# Patient Record
Sex: Male | Born: 1960 | Race: Black or African American | Hispanic: No | Marital: Single | Smoking: Current some day smoker
Health system: Southern US, Community
[De-identification: ages and names within clinical notes are randomized; demographics above are authoritative.]

## PROBLEM LIST (undated history)

## (undated) DIAGNOSIS — K148 Other diseases of tongue: Secondary | ICD-10-CM

## (undated) DIAGNOSIS — K219 Gastro-esophageal reflux disease without esophagitis: Secondary | ICD-10-CM

## (undated) DIAGNOSIS — C801 Malignant (primary) neoplasm, unspecified: Secondary | ICD-10-CM

## (undated) DIAGNOSIS — Z923 Personal history of irradiation: Secondary | ICD-10-CM

## (undated) DIAGNOSIS — I10 Essential (primary) hypertension: Secondary | ICD-10-CM

## (undated) HISTORY — PX: FRACTURE SURGERY: SHX138

---

## 1997-07-12 ENCOUNTER — Emergency Department (HOSPITAL_COMMUNITY): Admission: EM | Admit: 1997-07-12 | Discharge: 1997-07-12 | Payer: Self-pay | Admitting: Emergency Medicine

## 1999-04-14 ENCOUNTER — Emergency Department (HOSPITAL_COMMUNITY): Admission: EM | Admit: 1999-04-14 | Discharge: 1999-04-15 | Payer: Self-pay | Admitting: Emergency Medicine

## 1999-04-14 ENCOUNTER — Encounter: Payer: Self-pay | Admitting: Emergency Medicine

## 2000-10-09 ENCOUNTER — Emergency Department (HOSPITAL_COMMUNITY): Admission: EM | Admit: 2000-10-09 | Discharge: 2000-10-09 | Payer: Self-pay | Admitting: Emergency Medicine

## 2000-10-10 ENCOUNTER — Encounter: Payer: Self-pay | Admitting: Emergency Medicine

## 2001-06-16 ENCOUNTER — Emergency Department (HOSPITAL_COMMUNITY): Admission: EM | Admit: 2001-06-16 | Discharge: 2001-06-17 | Payer: Self-pay | Admitting: Emergency Medicine

## 2001-06-17 ENCOUNTER — Encounter: Payer: Self-pay | Admitting: Emergency Medicine

## 2002-08-15 ENCOUNTER — Emergency Department (HOSPITAL_COMMUNITY): Admission: EM | Admit: 2002-08-15 | Discharge: 2002-08-15 | Payer: Self-pay | Admitting: Emergency Medicine

## 2002-08-15 ENCOUNTER — Encounter: Payer: Self-pay | Admitting: Emergency Medicine

## 2003-05-23 ENCOUNTER — Emergency Department (HOSPITAL_COMMUNITY): Admission: EM | Admit: 2003-05-23 | Discharge: 2003-05-23 | Payer: Self-pay | Admitting: Emergency Medicine

## 2003-07-21 ENCOUNTER — Emergency Department (HOSPITAL_COMMUNITY): Admission: EM | Admit: 2003-07-21 | Discharge: 2003-07-21 | Payer: Self-pay | Admitting: Emergency Medicine

## 2003-08-11 ENCOUNTER — Emergency Department (HOSPITAL_COMMUNITY): Admission: EM | Admit: 2003-08-11 | Discharge: 2003-08-11 | Payer: Self-pay | Admitting: Emergency Medicine

## 2003-08-23 ENCOUNTER — Emergency Department (HOSPITAL_COMMUNITY): Admission: EM | Admit: 2003-08-23 | Discharge: 2003-08-24 | Payer: Self-pay | Admitting: Emergency Medicine

## 2004-05-26 ENCOUNTER — Emergency Department (HOSPITAL_COMMUNITY): Admission: EM | Admit: 2004-05-26 | Discharge: 2004-05-27 | Payer: Self-pay | Admitting: Emergency Medicine

## 2004-09-15 ENCOUNTER — Emergency Department (HOSPITAL_COMMUNITY): Admission: EM | Admit: 2004-09-15 | Discharge: 2004-09-16 | Payer: Self-pay | Admitting: Emergency Medicine

## 2005-01-07 ENCOUNTER — Inpatient Hospital Stay (HOSPITAL_COMMUNITY): Admission: AC | Admit: 2005-01-07 | Discharge: 2005-01-08 | Payer: Self-pay

## 2005-04-16 ENCOUNTER — Emergency Department (HOSPITAL_COMMUNITY): Admission: EM | Admit: 2005-04-16 | Discharge: 2005-04-16 | Payer: Self-pay | Admitting: Emergency Medicine

## 2005-08-02 ENCOUNTER — Emergency Department (HOSPITAL_COMMUNITY): Admission: EM | Admit: 2005-08-02 | Discharge: 2005-08-02 | Payer: Self-pay | Admitting: Emergency Medicine

## 2005-09-18 ENCOUNTER — Emergency Department (HOSPITAL_COMMUNITY): Admission: EM | Admit: 2005-09-18 | Discharge: 2005-09-19 | Payer: Self-pay | Admitting: Emergency Medicine

## 2005-12-23 ENCOUNTER — Emergency Department (HOSPITAL_COMMUNITY): Admission: EM | Admit: 2005-12-23 | Discharge: 2005-12-23 | Payer: Self-pay | Admitting: *Deleted

## 2006-01-06 ENCOUNTER — Emergency Department (HOSPITAL_COMMUNITY): Admission: EM | Admit: 2006-01-06 | Discharge: 2006-01-06 | Payer: Self-pay | Admitting: Emergency Medicine

## 2006-01-26 ENCOUNTER — Emergency Department (HOSPITAL_COMMUNITY): Admission: EM | Admit: 2006-01-26 | Discharge: 2006-01-26 | Payer: Self-pay | Admitting: Emergency Medicine

## 2006-01-26 ENCOUNTER — Emergency Department (HOSPITAL_COMMUNITY): Admission: EM | Admit: 2006-01-26 | Discharge: 2006-01-27 | Payer: Self-pay | Admitting: Emergency Medicine

## 2006-02-02 ENCOUNTER — Emergency Department (HOSPITAL_COMMUNITY): Admission: EM | Admit: 2006-02-02 | Discharge: 2006-02-02 | Payer: Self-pay | Admitting: Emergency Medicine

## 2006-02-11 ENCOUNTER — Emergency Department (HOSPITAL_COMMUNITY): Admission: EM | Admit: 2006-02-11 | Discharge: 2006-02-12 | Payer: Self-pay | Admitting: Emergency Medicine

## 2006-02-26 ENCOUNTER — Emergency Department (HOSPITAL_COMMUNITY): Admission: EM | Admit: 2006-02-26 | Discharge: 2006-02-27 | Payer: Self-pay | Admitting: Emergency Medicine

## 2006-09-26 ENCOUNTER — Emergency Department (HOSPITAL_COMMUNITY): Admission: EM | Admit: 2006-09-26 | Discharge: 2006-09-26 | Payer: Self-pay | Admitting: Emergency Medicine

## 2007-01-25 ENCOUNTER — Emergency Department (HOSPITAL_COMMUNITY): Admission: EM | Admit: 2007-01-25 | Discharge: 2007-01-26 | Payer: Self-pay | Admitting: Family Medicine

## 2007-04-09 ENCOUNTER — Emergency Department (HOSPITAL_COMMUNITY): Admission: EM | Admit: 2007-04-09 | Discharge: 2007-04-10 | Payer: Self-pay | Admitting: Emergency Medicine

## 2007-09-28 ENCOUNTER — Emergency Department (HOSPITAL_COMMUNITY): Admission: EM | Admit: 2007-09-28 | Discharge: 2007-09-28 | Payer: Self-pay | Admitting: Emergency Medicine

## 2007-11-19 ENCOUNTER — Emergency Department (HOSPITAL_COMMUNITY): Admission: EM | Admit: 2007-11-19 | Discharge: 2007-11-20 | Payer: Self-pay | Admitting: Emergency Medicine

## 2007-11-20 ENCOUNTER — Inpatient Hospital Stay (HOSPITAL_COMMUNITY): Admission: EM | Admit: 2007-11-20 | Discharge: 2007-11-23 | Payer: Self-pay | Admitting: Psychiatry

## 2007-11-20 ENCOUNTER — Ambulatory Visit: Payer: Self-pay | Admitting: Psychiatry

## 2007-12-27 ENCOUNTER — Emergency Department (HOSPITAL_COMMUNITY): Admission: EM | Admit: 2007-12-27 | Discharge: 2007-12-27 | Payer: Self-pay | Admitting: Emergency Medicine

## 2008-02-04 ENCOUNTER — Emergency Department (HOSPITAL_COMMUNITY): Admission: EM | Admit: 2008-02-04 | Discharge: 2008-02-05 | Payer: Self-pay | Admitting: Emergency Medicine

## 2008-08-25 ENCOUNTER — Emergency Department (HOSPITAL_COMMUNITY): Admission: EM | Admit: 2008-08-25 | Discharge: 2008-08-25 | Payer: Self-pay | Admitting: Emergency Medicine

## 2008-08-26 ENCOUNTER — Emergency Department (HOSPITAL_COMMUNITY): Admission: EM | Admit: 2008-08-26 | Discharge: 2008-08-27 | Payer: Self-pay | Admitting: Emergency Medicine

## 2008-09-08 ENCOUNTER — Emergency Department (HOSPITAL_COMMUNITY): Admission: EM | Admit: 2008-09-08 | Discharge: 2008-09-08 | Payer: Self-pay | Admitting: Emergency Medicine

## 2009-03-28 ENCOUNTER — Emergency Department (HOSPITAL_COMMUNITY): Admission: EM | Admit: 2009-03-28 | Discharge: 2009-03-29 | Payer: Self-pay | Admitting: Emergency Medicine

## 2009-07-20 ENCOUNTER — Emergency Department (HOSPITAL_COMMUNITY): Admission: EM | Admit: 2009-07-20 | Discharge: 2009-07-20 | Payer: Self-pay | Admitting: Emergency Medicine

## 2009-09-16 ENCOUNTER — Emergency Department (HOSPITAL_COMMUNITY): Admission: EM | Admit: 2009-09-16 | Discharge: 2009-09-16 | Payer: Self-pay | Admitting: Emergency Medicine

## 2009-11-11 ENCOUNTER — Emergency Department (HOSPITAL_COMMUNITY): Admission: EM | Admit: 2009-11-11 | Discharge: 2009-11-11 | Payer: Self-pay | Admitting: Emergency Medicine

## 2010-01-21 ENCOUNTER — Emergency Department (HOSPITAL_COMMUNITY)
Admission: EM | Admit: 2010-01-21 | Discharge: 2010-01-21 | Payer: Self-pay | Source: Home / Self Care | Admitting: Emergency Medicine

## 2010-05-01 LAB — POCT I-STAT, CHEM 8
BUN: 3 mg/dL — ABNORMAL LOW (ref 6–23)
Calcium, Ion: 1.07 mmol/L — ABNORMAL LOW (ref 1.12–1.32)
Chloride: 99 meq/L (ref 96–112)
Creatinine, Ser: 0.9 mg/dL (ref 0.4–1.5)
Glucose, Bld: 124 mg/dL — ABNORMAL HIGH (ref 70–99)
HCT: 39 % (ref 39.0–52.0)
Hemoglobin: 13.3 g/dL (ref 13.0–17.0)
Potassium: 3.8 meq/L (ref 3.5–5.1)
Sodium: 132 mEq/L — ABNORMAL LOW (ref 135–145)
TCO2: 29 mmol/L (ref 0–100)

## 2010-05-01 LAB — COMPREHENSIVE METABOLIC PANEL
AST: 254 U/L — ABNORMAL HIGH (ref 0–37)
Albumin: 3.1 g/dL — ABNORMAL LOW (ref 3.5–5.2)
Alkaline Phosphatase: 167 U/L — ABNORMAL HIGH (ref 39–117)
CO2: 26 mEq/L (ref 19–32)
Creatinine, Ser: 0.68 mg/dL (ref 0.4–1.5)
Glucose, Bld: 125 mg/dL — ABNORMAL HIGH (ref 70–99)
Total Bilirubin: 1.2 mg/dL (ref 0.3–1.2)

## 2010-05-01 LAB — URINALYSIS, ROUTINE W REFLEX MICROSCOPIC
Glucose, UA: NEGATIVE mg/dL
Ketones, ur: 15 mg/dL — AB
Nitrite: NEGATIVE
Protein, ur: 30 mg/dL — AB
Specific Gravity, Urine: 1.027 (ref 1.005–1.030)
Urobilinogen, UA: 1 mg/dL (ref 0.0–1.0)
pH: 5.5 (ref 5.0–8.0)

## 2010-05-01 LAB — COMPREHENSIVE METABOLIC PANEL WITH GFR
ALT: 252 U/L — ABNORMAL HIGH (ref 0–53)
BUN: 5 mg/dL — ABNORMAL LOW (ref 6–23)
Calcium: 8.9 mg/dL (ref 8.4–10.5)
Chloride: 97 meq/L (ref 96–112)
GFR calc Af Amer: >60 mL/min (ref >60)
GFR calc non Af Amer: >60 mL/min (ref >60)
Potassium: 3.3 meq/L — ABNORMAL LOW (ref 3.5–5.1)
Sodium: 132 meq/L — ABNORMAL LOW (ref 135–145)
Total Protein: 6.5 g/dL (ref 6.0–8.3)

## 2010-05-01 LAB — LIPASE, BLOOD: Lipase: 114 U/L — ABNORMAL HIGH (ref 11–59)

## 2010-05-01 LAB — URINE MICROSCOPIC-ADD ON

## 2010-05-25 LAB — POCT I-STAT, CHEM 8
BUN: 13 mg/dL (ref 6–23)
Calcium, Ion: 1.03 mmol/L — ABNORMAL LOW (ref 1.12–1.32)
Chloride: 105 mEq/L (ref 96–112)
Creatinine, Ser: 0.8 mg/dL (ref 0.4–1.5)
Glucose, Bld: 91 mg/dL (ref 70–99)

## 2010-05-25 LAB — DIFFERENTIAL
Basophils Relative: 0 % (ref 0–1)
Eosinophils Absolute: 0 10*3/uL (ref 0.0–0.7)
Monocytes Absolute: 0.6 10*3/uL (ref 0.1–1.0)
Monocytes Relative: 8 % (ref 3–12)
Neutrophils Relative %: 68 % (ref 43–77)

## 2010-05-25 LAB — CBC
Hemoglobin: 12.6 g/dL — ABNORMAL LOW (ref 13.0–17.0)
MCHC: 35.2 g/dL (ref 30.0–36.0)
MCV: 100.2 fL — ABNORMAL HIGH (ref 78.0–100.0)
RBC: 3.56 MIL/uL — ABNORMAL LOW (ref 4.22–5.81)
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
## 2010-07-04 NOTE — Discharge Summary (Signed)
NAME:  NAYSHAWN, MESTA NO.:  [id number]   MEDICAL RECORD NO.:  [id number]          PATIENT TYPE:  IPS   LOCATION:  0508                          FACILITY:  [facility]   PHYSICIAN:  Geoffery Lyons, M.D.      DATE OF BIRTH:  April 08, 1960   DATE OF ADMISSION:  11/20/2007  DATE OF DISCHARGE:  11/23/2007                               DISCHARGE SUMMARY   CHIEF COMPLAINT AND PRESENT ILLNESS:  This was the first admission to  [facility] for this 50 year old, single, African  American male voluntarily admitted.  Presented with chronic alcohol  abuse since age 89.  He was found passed out on the floor of a  laundromat and was brought to the emergency room by emergency services.  Alcohol level was 388.  UDS negative for substances.  Admitted with  suicidal thoughts with a plan to run into traffic in order to be hit by  a vehicle.  Has been drinking steadily since June with recent escalation  to 6-12 beers daily.  Endorsed that I have nothing to live for.  Recently released from jail and has been living from place to place.   PAST PSYCHIATRIC HISTORY:  First time at [facility].  According  to the history, endorsed persistent alcohol use since age 43.  Denies  previous treatment.  No clear evidence of abstinence.  Has some past  history of marijuana use.  No cocaine.   MEDICAL HISTORY:  Noncontributory.   MEDICATIONS:  None.   PHYSICAL EXAMINATION:  Failed to show any acute findings   LABORATORY WORK:  Magnesium 1.8, TSH 4.561, T4 1.21, B12 486.  RPR  nonreactive.  Mean corpuscular volume 101.8.  SGOT 64, SGPT 60, total  bilirubin 1.1.   PHYSICAL EXAMINATION:  Reveals a fully alert and cooperative male with  adequate eye contact.  Mood depressed.  Affect depressed.  Disheveled.  Speech was normal rate, tone and production.  Feeling guilty and  hopeless about the future.  Thought processes revealed suicidal thoughts  with plan, no intent.  He keeps saying he  has no reason to live.  Hopeless.  No homicidal thoughts.  No hallucinations.  No delusions.  Cognition well preserved.   IMPRESSION:  AXIS I:  Alcohol dependence, substance-induced depression.  AXIS II: No diagnosis.  AXIS III:  Increased transaminases.  AXIS IV: Moderate.  AXIS V:  On admission, 35; GAF in the last year, 55-60.   COURSE IN THE HOSPITAL:  He was admitted and started individual and  group psychotherapy.  He was detoxified with Librium.  As already  stated, he did endorse persistent use of alcohol of six to seven 40-  ounce beers daily.  Thoughts of suicide and depression.  Has three  children, ages 75, 68 and 14.  No income.  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  Starts drinking in the morning.  Strong family history of  alcohol dependency.  Recently incarcerated.  Drinking every day since  June.  The incarceration was secondary to drinking-related charges.  On  November 22, 2007, he endorsed that he felt he was ready to  move on and  work on himself.  He wanted to be considered for a halfway house.  He  wanted to work at __________ [employer] to do the work.  On November 23, 2007, he endorsed that he was not going to [facility].  He endorsed  that he was going to go to the shelter and go to work.  Beds were  available at two halfway houses, but he refused.  He would like to try  his plan first of going to the homeless shelter and save money and from  there go to an independent apartment at a somewhat independent living  facility.  Upon discharge, he is in full contact reality with no overt  evidence of withdrawal.  No active suicidal or homicidal ideations.  Discharged to outpatient followup.   DISCHARGE DIAGNOSES:  AXIS I: Alcohol dependence, substance-induced mood  disorder.  AXIS II: No diagnosis.  AXIS III:  Increased liver enzymes.  AXIS IV: Moderate.  AXIS V:  On discharge, 50.   DISCHARGE MEDICATIONS:  Discharged on Librium taper 25 mg 1 by mouth on  this  discharge date at 5:00 p.m., then 1 the following day, then 1 twice  daily the following day, than on November 25, 2007, one in the morning and  discontinue.   FOLLOWUP:  Follow up at [facility] with [facility].      Geoffery Lyons, M.D.  Electronically Signed     IL/MEDQ  D:  12/15/2007  T:  12/16/2007  Job:  [id number]

## 2010-09-02 ENCOUNTER — Emergency Department (HOSPITAL_COMMUNITY)
Admission: EM | Admit: 2010-09-02 | Discharge: 2010-09-02 | Disposition: A | Discharge status: Home / Self Care | Payer: Self-pay | Attending: Emergency Medicine | Admitting: Emergency Medicine

## 2010-09-02 ENCOUNTER — Emergency Department (HOSPITAL_COMMUNITY): Payer: Self-pay

## 2010-09-02 DIAGNOSIS — Y998 Other external cause status: Secondary | ICD-10-CM | POA: Insufficient documentation

## 2010-09-02 DIAGNOSIS — F1010 Alcohol abuse, uncomplicated: Secondary | ICD-10-CM | POA: Insufficient documentation

## 2010-09-02 DIAGNOSIS — S0990XA Unspecified injury of head, initial encounter: Secondary | ICD-10-CM | POA: Insufficient documentation

## 2010-09-02 LAB — ETHANOL: Alcohol, Ethyl (B): 179 mg/dL — ABNORMAL HIGH (ref 0–11)

## 2010-09-02 LAB — POCT I-STAT, CHEM 8
BUN: 9 mg/dL (ref 6–23)
Calcium, Ion: 1.05 mmol/L — ABNORMAL LOW (ref 1.12–1.32)
Creatinine, Ser: 1.3 mg/dL (ref 0.50–1.35)
TCO2: 23 mmol/L (ref 0–100)

## 2010-11-07 LAB — CBC
Hemoglobin: 13.5
MCHC: 34.8
MCV: 99.5
RBC: 3.91 — ABNORMAL LOW

## 2010-11-07 LAB — DIFFERENTIAL
Basophils Absolute: 0.1
Basophils Relative: 2 — ABNORMAL HIGH
Eosinophils Absolute: 0.1
Lymphs Abs: 2.8
Neutrophils Relative %: 51

## 2010-11-07 LAB — COMPREHENSIVE METABOLIC PANEL
ALT: 80 — ABNORMAL HIGH
CO2: 27
Calcium: 9.3
Chloride: 105
GFR calc non Af Amer: >60
Glucose, Bld: 109 — ABNORMAL HIGH
Sodium: 140
Total Bilirubin: 0.7

## 2010-11-07 LAB — URINALYSIS, ROUTINE W REFLEX MICROSCOPIC
Glucose, UA: NEGATIVE
Ketones, ur: NEGATIVE
Nitrite: NEGATIVE
Protein, ur: NEGATIVE
Urobilinogen, UA: 0.2

## 2010-11-14 LAB — DIFFERENTIAL
Basophils Absolute: 0.1
Lymphocytes Relative: 54 — ABNORMAL HIGH
Lymphs Abs: 2.4
Neutro Abs: 1.5 — ABNORMAL LOW
Neutrophils Relative %: 33 — ABNORMAL LOW

## 2010-11-14 LAB — CBC
Platelets: 183
RDW: 16 — ABNORMAL HIGH
WBC: 4.5

## 2010-11-17 LAB — URINALYSIS, ROUTINE W REFLEX MICROSCOPIC
Bilirubin Urine: NEGATIVE
Hgb urine dipstick: NEGATIVE
Ketones, ur: NEGATIVE
Nitrite: NEGATIVE
Protein, ur: NEGATIVE
Urobilinogen, UA: 0.2

## 2010-11-17 LAB — COMPREHENSIVE METABOLIC PANEL
ALT: 60 — ABNORMAL HIGH
Albumin: 4.2
Alkaline Phosphatase: 49
BUN: 10
Chloride: 102
Glucose, Bld: 82
Potassium: 4
Sodium: 139
Total Bilirubin: 1.1
Total Protein: 7.2

## 2010-11-17 LAB — CBC
HCT: 38.5 — ABNORMAL LOW
Hemoglobin: 13.4
Platelets: 280
RDW: 16.1 — ABNORMAL HIGH
WBC: 5.7

## 2010-11-17 LAB — ETHANOL
Alcohol, Ethyl (B): 191 — ABNORMAL HIGH
Alcohol, Ethyl (B): 388 — ABNORMAL HIGH

## 2010-11-17 LAB — RAPID URINE DRUG SCREEN, HOSP PERFORMED
Amphetamines: NOT DETECTED
Tetrahydrocannabinol: NOT DETECTED

## 2010-11-17 LAB — DIFFERENTIAL
Basophils Absolute: 0.1
Basophils Relative: 1
Eosinophils Absolute: 0.1
Monocytes Absolute: 0.4
Monocytes Relative: 8
Neutro Abs: 1.5 — ABNORMAL LOW

## 2010-11-17 LAB — URINE CULTURE: Colony Count: NO GROWTH

## 2010-11-18 LAB — MAGNESIUM: Magnesium: 1.8

## 2010-11-18 LAB — RPR: RPR Ser Ql: NONREACTIVE

## 2010-11-20 LAB — URINALYSIS, ROUTINE W REFLEX MICROSCOPIC
Nitrite: NEGATIVE
Protein, ur: NEGATIVE mg/dL
Specific Gravity, Urine: 1.009 (ref 1.005–1.030)
Urobilinogen, UA: 0.2 mg/dL (ref 0.0–1.0)

## 2010-11-24 LAB — HEPATIC FUNCTION PANEL
Albumin: 3.9
Alkaline Phosphatase: 46
Indirect Bilirubin: 0.7
Total Protein: 7

## 2010-11-24 LAB — PROTIME-INR
INR: 1.1
Prothrombin Time: 14.9

## 2010-11-24 LAB — DIFFERENTIAL
Basophils Absolute: 0.1
Eosinophils Absolute: 0.2
Eosinophils Relative: 3
Lymphocytes Relative: 56 — ABNORMAL HIGH
Lymphs Abs: 3.6
Monocytes Absolute: 0.6

## 2010-11-24 LAB — CBC
HCT: 36.9 — ABNORMAL LOW
Hemoglobin: 13.3
MCV: 101.3 — ABNORMAL HIGH
RDW: 13.9

## 2010-11-24 LAB — I-STAT 8, (EC8 V) (CONVERTED LAB)
Bicarbonate: 24.1 — ABNORMAL HIGH
Chloride: 109
HCT: 40
Operator id: 247071
pCO2, Ven: 37.1 — ABNORMAL LOW
pH, Ven: 7.421 — ABNORMAL HIGH

## 2010-11-24 LAB — POCT I-STAT CREATININE: Creatinine, Ser: 1.2

## 2010-11-24 LAB — OCCULT BLOOD X 1 CARD TO LAB, STOOL: Fecal Occult Bld: POSITIVE

## 2010-11-24 LAB — APTT: aPTT: 27

## 2010-12-01 LAB — I-STAT 8, (EC8 V) (CONVERTED LAB)
BUN: 8
Bicarbonate: 20.1
Glucose, Bld: 89
Hemoglobin: 12.9 — ABNORMAL LOW
pCO2, Ven: 39.2 — ABNORMAL LOW

## 2010-12-01 LAB — DIFFERENTIAL
Basophils Absolute: 0.1
Basophils Relative: 3 — ABNORMAL HIGH
Eosinophils Relative: 3
Monocytes Absolute: 0.3

## 2010-12-01 LAB — CBC
HCT: 32.9 — ABNORMAL LOW
Hemoglobin: 11.6 — ABNORMAL LOW
MCHC: 35.3
RDW: 17.2 — ABNORMAL HIGH

## 2010-12-01 LAB — HEPATIC FUNCTION PANEL
ALT: 34
Alkaline Phosphatase: 56
Bilirubin, Direct: <0.1
Total Bilirubin: 0.7

## 2010-12-31 ENCOUNTER — Emergency Department (HOSPITAL_COMMUNITY)
Admission: EM | Admit: 2010-12-31 | Discharge: 2010-12-31 | Disposition: A | Discharge status: Home / Self Care | Payer: Self-pay | Attending: Emergency Medicine | Admitting: Emergency Medicine

## 2010-12-31 ENCOUNTER — Emergency Department (HOSPITAL_COMMUNITY): Payer: Self-pay

## 2010-12-31 DIAGNOSIS — W172XXA Fall into hole, initial encounter: Secondary | ICD-10-CM | POA: Insufficient documentation

## 2010-12-31 DIAGNOSIS — S8391XA Sprain of unspecified site of right knee, initial encounter: Secondary | ICD-10-CM

## 2010-12-31 DIAGNOSIS — Y9302 Activity, running: Secondary | ICD-10-CM | POA: Insufficient documentation

## 2010-12-31 DIAGNOSIS — F17200 Nicotine dependence, unspecified, uncomplicated: Secondary | ICD-10-CM | POA: Insufficient documentation

## 2010-12-31 DIAGNOSIS — M25569 Pain in unspecified knee: Secondary | ICD-10-CM | POA: Insufficient documentation

## 2010-12-31 DIAGNOSIS — IMO0002 Reserved for concepts with insufficient information to code with codable children: Secondary | ICD-10-CM | POA: Insufficient documentation

## 2010-12-31 HISTORY — DX: Essential (primary) hypertension: I10

## 2010-12-31 MED ORDER — OXYCODONE-ACETAMINOPHEN 5-325 MG PO TABS
1.0000 | ORAL_TABLET | Freq: Once | ORAL | Status: AC
  Administered 2010-12-31: 1 via ORAL
  Filled 2010-12-31: qty 1

## 2010-12-31 MED ORDER — IBUPROFEN 800 MG PO TABS: 800.0000 mg | ORAL_TABLET | Freq: Three times a day (TID) | ORAL | Status: AC | PRN

## 2010-12-31 MED ORDER — IBUPROFEN 800 MG PO TABS: 800.0000 mg | ORAL_TABLET | Freq: Three times a day (TID) | ORAL | Status: DC | PRN

## 2010-12-31 MED ORDER — TRAMADOL HCL 50 MG PO TABS: 50.0000 mg | ORAL_TABLET | Freq: Four times a day (QID) | ORAL | Status: AC | PRN

## 2010-12-31 MED ORDER — TRAMADOL HCL 50 MG PO TABS: 50.0000 mg | ORAL_TABLET | Freq: Four times a day (QID) | ORAL | Status: DC | PRN

## 2010-12-31 NOTE — Progress Notes (Signed)
Orthopedic Tech Progress Note Patient Details:  Lawrence Arias 1961-02-02 [id number] Pt said he needed only one crutches.    [name] [facility] 12/31/2010, 11:30 PM
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
## 2011-01-01 NOTE — ED Provider Notes (Signed)
Medical screening examination/treatment/procedure(s) were performed by non-physician practitioner and as supervising physician I was immediately available for consultation/collaboration.  Nicholes Stairs, MD 01/01/11 [phone]

## 2011-10-04 ENCOUNTER — Emergency Department (HOSPITAL_COMMUNITY)
Admission: EM | Admit: 2011-10-04 | Discharge: 2011-10-04 | Disposition: A | Discharge status: Home / Self Care | Payer: Self-pay | Attending: Emergency Medicine | Admitting: Emergency Medicine

## 2011-10-04 ENCOUNTER — Encounter (HOSPITAL_COMMUNITY): Payer: Self-pay | Admitting: Emergency Medicine

## 2011-10-04 DIAGNOSIS — Y9229 Other specified public building as the place of occurrence of the external cause: Secondary | ICD-10-CM | POA: Insufficient documentation

## 2011-10-04 DIAGNOSIS — S21209A Unspecified open wound of unspecified back wall of thorax without penetration into thoracic cavity, initial encounter: Secondary | ICD-10-CM | POA: Insufficient documentation

## 2011-10-04 DIAGNOSIS — T07XXXA Unspecified multiple injuries, initial encounter: Secondary | ICD-10-CM

## 2011-10-04 DIAGNOSIS — S21219A Laceration without foreign body of unspecified back wall of thorax without penetration into thoracic cavity, initial encounter: Secondary | ICD-10-CM

## 2011-10-04 DIAGNOSIS — S41112A Laceration without foreign body of left upper arm, initial encounter: Secondary | ICD-10-CM

## 2011-10-04 DIAGNOSIS — I10 Essential (primary) hypertension: Secondary | ICD-10-CM | POA: Insufficient documentation

## 2011-10-04 DIAGNOSIS — S41109A Unspecified open wound of unspecified upper arm, initial encounter: Secondary | ICD-10-CM | POA: Insufficient documentation

## 2011-10-04 DIAGNOSIS — F17200 Nicotine dependence, unspecified, uncomplicated: Secondary | ICD-10-CM | POA: Insufficient documentation

## 2011-10-04 MED ORDER — ACETAMINOPHEN 325 MG PO TABS
ORAL_TABLET | ORAL | Status: AC
  Administered 2011-10-04: 650 mg
  Filled 2011-10-04: qty 2

## 2011-10-04 MED ORDER — IBUPROFEN 200 MG PO TABS
ORAL_TABLET | ORAL | Status: AC
  Filled 2011-10-04: qty 2

## 2011-10-04 MED ORDER — CEPHALEXIN 500 MG PO CAPS: 500.0000 mg | ORAL_CAPSULE | Freq: Four times a day (QID) | ORAL | Status: AC

## 2011-10-04 MED ORDER — CEPHALEXIN 500 MG PO CAPS
500.0000 mg | ORAL_CAPSULE | Freq: Once | ORAL | Status: AC
  Administered 2011-10-04: 500 mg via ORAL
  Filled 2011-10-04: qty 1

## 2011-10-04 NOTE — ED Notes (Signed)
PA at bedside suturing.

## 2011-10-04 NOTE — ED Provider Notes (Signed)
Medical screening examination/treatment/procedure(s) were conducted as a shared visit with non-physician practitioner(s) and myself.  I personally evaluated the patient during the encounter   Hanley Seamen, MD 10/04/11 1230
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
## 2011-10-04 NOTE — ED Notes (Signed)
Wounds dressed with bacitracin and gauze.   Instructions for wound care given.
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
## 2011-10-04 NOTE — ED Provider Notes (Signed)
LACERATION REPAIR x 2  Performed by: Jaci Carrel Authorized by: Jaci Carrel Consent: Verbal consent obtained. Risks and benefits: risks, benefits and alternatives were discussed Consent given by: patient Patient identity confirmed: provided demographic data Prepped and Draped in normal sterile fashion Wound explored  Laceration Location: mid lumbar region / left upper lateral extremity  Laceration Length: 2cm / 7 cm  No Foreign Bodies seen or palpated  Anesthesia: local infiltration  Local anesthetic: lidocaine 2% w epinephrine  Anesthetic total: 4 ml  Irrigation method: syringe Amount of cleaning: standard  Skin closure: 4.0 prolene  Number of sutures: 3 / 10  Technique: simple interupted  Patient tolerance: Patient tolerated the procedure well with no immediate complications.   Jaci Carrel, [place] 10/04/11 1224

## 2012-04-12 ENCOUNTER — Encounter (HOSPITAL_COMMUNITY): Payer: Self-pay | Admitting: Adult Health

## 2012-04-12 ENCOUNTER — Emergency Department (HOSPITAL_COMMUNITY): Payer: Self-pay

## 2012-04-12 ENCOUNTER — Emergency Department (HOSPITAL_COMMUNITY)
Admission: EM | Admit: 2012-04-12 | Discharge: 2012-04-12 | Disposition: A | Discharge status: Home / Self Care | Payer: Self-pay | Attending: Emergency Medicine | Admitting: Emergency Medicine

## 2012-04-12 DIAGNOSIS — M549 Dorsalgia, unspecified: Secondary | ICD-10-CM

## 2012-04-12 DIAGNOSIS — Y9389 Activity, other specified: Secondary | ICD-10-CM | POA: Insufficient documentation

## 2012-04-12 DIAGNOSIS — IMO0002 Reserved for concepts with insufficient information to code with codable children: Secondary | ICD-10-CM | POA: Insufficient documentation

## 2012-04-12 DIAGNOSIS — Z8781 Personal history of (healed) traumatic fracture: Secondary | ICD-10-CM | POA: Insufficient documentation

## 2012-04-12 DIAGNOSIS — Z79899 Other long term (current) drug therapy: Secondary | ICD-10-CM | POA: Insufficient documentation

## 2012-04-12 DIAGNOSIS — R509 Fever, unspecified: Secondary | ICD-10-CM | POA: Insufficient documentation

## 2012-04-12 DIAGNOSIS — S298XXA Other specified injuries of thorax, initial encounter: Secondary | ICD-10-CM | POA: Insufficient documentation

## 2012-04-12 DIAGNOSIS — R110 Nausea: Secondary | ICD-10-CM | POA: Insufficient documentation

## 2012-04-12 DIAGNOSIS — J3489 Other specified disorders of nose and nasal sinuses: Secondary | ICD-10-CM | POA: Insufficient documentation

## 2012-04-12 DIAGNOSIS — Y92410 Unspecified street and highway as the place of occurrence of the external cause: Secondary | ICD-10-CM | POA: Insufficient documentation

## 2012-04-12 DIAGNOSIS — R6889 Other general symptoms and signs: Secondary | ICD-10-CM | POA: Insufficient documentation

## 2012-04-12 DIAGNOSIS — I10 Essential (primary) hypertension: Secondary | ICD-10-CM | POA: Insufficient documentation

## 2012-04-12 DIAGNOSIS — F17200 Nicotine dependence, unspecified, uncomplicated: Secondary | ICD-10-CM | POA: Insufficient documentation

## 2012-04-12 DIAGNOSIS — J029 Acute pharyngitis, unspecified: Secondary | ICD-10-CM | POA: Insufficient documentation

## 2012-04-12 LAB — RAPID STREP SCREEN (MED CTR MEBANE ONLY): Streptococcus, Group A Screen (Direct): NEGATIVE

## 2012-04-12 MED ORDER — TRAMADOL HCL 50 MG PO TABS
50.0000 mg | ORAL_TABLET | Freq: Once | ORAL | Status: AC
  Administered 2012-04-12: 50 mg via ORAL
  Filled 2012-04-12: qty 1

## 2012-04-12 MED ORDER — PANTOPRAZOLE SODIUM 20 MG PO TBEC: 40.0000 mg | DELAYED_RELEASE_TABLET | Freq: Every day | ORAL | Status: DC

## 2012-04-12 MED ORDER — TRAMADOL HCL 50 MG PO TABS: 50.0000 mg | ORAL_TABLET | Freq: Four times a day (QID) | ORAL | Status: DC | PRN

## 2012-04-12 MED ORDER — RANITIDINE HCL 150 MG PO CAPS: 150.0000 mg | ORAL_CAPSULE | Freq: Every day | ORAL | Status: DC

## 2012-04-12 NOTE — ED Notes (Signed)
No answer x 1 for triage
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
## 2012-04-12 NOTE — ED Provider Notes (Signed)
Medical screening examination/treatment/procedure(s) were performed by non-physician practitioner and as supervising physician I was immediately available for consultation/collaboration.  [name], MD 04/12/12 2134

## 2012-04-12 NOTE — ED Notes (Signed)
Returned from xray
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
## 2012-07-01 ENCOUNTER — Encounter (HOSPITAL_COMMUNITY): Payer: Self-pay | Admitting: Emergency Medicine

## 2012-07-01 ENCOUNTER — Emergency Department (HOSPITAL_COMMUNITY): Payer: Self-pay

## 2012-07-01 ENCOUNTER — Emergency Department (HOSPITAL_COMMUNITY)
Admission: EM | Admit: 2012-07-01 | Discharge: 2012-07-02 | Disposition: A | Discharge status: Home / Self Care | Payer: Self-pay | Attending: Emergency Medicine | Admitting: Emergency Medicine

## 2012-07-01 DIAGNOSIS — Y9289 Other specified places as the place of occurrence of the external cause: Secondary | ICD-10-CM | POA: Insufficient documentation

## 2012-07-01 DIAGNOSIS — S2249XA Multiple fractures of ribs, unspecified side, initial encounter for closed fracture: Secondary | ICD-10-CM | POA: Insufficient documentation

## 2012-07-01 DIAGNOSIS — I10 Essential (primary) hypertension: Secondary | ICD-10-CM | POA: Insufficient documentation

## 2012-07-01 DIAGNOSIS — S2231XA Fracture of one rib, right side, initial encounter for closed fracture: Secondary | ICD-10-CM

## 2012-07-01 DIAGNOSIS — W1809XA Striking against other object with subsequent fall, initial encounter: Secondary | ICD-10-CM | POA: Insufficient documentation

## 2012-07-01 DIAGNOSIS — S20219A Contusion of unspecified front wall of thorax, initial encounter: Secondary | ICD-10-CM | POA: Insufficient documentation

## 2012-07-01 DIAGNOSIS — Y9389 Activity, other specified: Secondary | ICD-10-CM | POA: Insufficient documentation

## 2012-07-01 DIAGNOSIS — F17200 Nicotine dependence, unspecified, uncomplicated: Secondary | ICD-10-CM | POA: Insufficient documentation

## 2012-07-01 LAB — URINE MICROSCOPIC-ADD ON

## 2012-07-01 LAB — URINALYSIS, ROUTINE W REFLEX MICROSCOPIC
Ketones, ur: 15 mg/dL — AB
Leukocytes, UA: NEGATIVE
Nitrite: NEGATIVE
pH: 7.5 (ref 5.0–8.0)

## 2012-07-01 MED ORDER — OXYCODONE-ACETAMINOPHEN 5-325 MG PO TABS: 1.0000 | ORAL_TABLET | ORAL | Status: DC | PRN

## 2012-07-01 MED ORDER — OXYCODONE-ACETAMINOPHEN 5-325 MG PO TABS
1.0000 | ORAL_TABLET | Freq: Once | ORAL | Status: AC
  Administered 2012-07-01: 1 via ORAL
  Filled 2012-07-01: qty 1

## 2012-07-01 MED ORDER — KETOROLAC TROMETHAMINE 60 MG/2ML IM SOLN
60.0000 mg | Freq: Once | INTRAMUSCULAR | Status: AC
  Administered 2012-07-01: 60 mg via INTRAMUSCULAR
  Filled 2012-07-01: qty 2

## 2012-07-01 NOTE — ED Notes (Signed)
Pt c/o right sided rib pain after falling from standing; pt noted to have itchy rash to arms and nose; no obvious injury noted
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
## 2012-07-02 NOTE — ED Provider Notes (Signed)
Medical screening examination/treatment/procedure(s) were performed by non-physician practitioner and as supervising physician I was immediately available for consultation/collaboration.   Enzley Kitchens, MD 07/02/12 0148 

## 2012-07-02 NOTE — ED Notes (Signed)
Pt educated on insentive spiraometer use. volume noted

## 2012-08-05 ENCOUNTER — Encounter (HOSPITAL_COMMUNITY): Payer: Self-pay | Admitting: *Deleted

## 2012-08-05 ENCOUNTER — Emergency Department (HOSPITAL_COMMUNITY)
Admission: EM | Admit: 2012-08-05 | Discharge: 2012-08-06 | Discharge status: Against Medical Advice | Payer: Self-pay | Attending: Emergency Medicine | Admitting: Emergency Medicine

## 2012-08-05 DIAGNOSIS — I10 Essential (primary) hypertension: Secondary | ICD-10-CM | POA: Insufficient documentation

## 2012-08-05 DIAGNOSIS — R071 Chest pain on breathing: Secondary | ICD-10-CM | POA: Insufficient documentation

## 2012-08-05 DIAGNOSIS — F17200 Nicotine dependence, unspecified, uncomplicated: Secondary | ICD-10-CM | POA: Insufficient documentation

## 2012-08-05 NOTE — ED Notes (Signed)
Pt states he has rib pain because his ribs are broke

## 2013-06-29 ENCOUNTER — Emergency Department (HOSPITAL_COMMUNITY)
Admission: EM | Admit: 2013-06-29 | Discharge: 2013-06-30 | Disposition: A | Discharge status: Home / Self Care | Payer: No Typology Code available for payment source | Attending: Emergency Medicine | Admitting: Emergency Medicine

## 2013-06-29 ENCOUNTER — Encounter (HOSPITAL_COMMUNITY): Payer: Self-pay | Admitting: Emergency Medicine

## 2013-06-29 DIAGNOSIS — Z79899 Other long term (current) drug therapy: Secondary | ICD-10-CM | POA: Insufficient documentation

## 2013-06-29 DIAGNOSIS — IMO0002 Reserved for concepts with insufficient information to code with codable children: Secondary | ICD-10-CM | POA: Insufficient documentation

## 2013-06-29 DIAGNOSIS — F17200 Nicotine dependence, unspecified, uncomplicated: Secondary | ICD-10-CM | POA: Insufficient documentation

## 2013-06-29 DIAGNOSIS — Y9389 Activity, other specified: Secondary | ICD-10-CM | POA: Insufficient documentation

## 2013-06-29 DIAGNOSIS — I10 Essential (primary) hypertension: Secondary | ICD-10-CM | POA: Insufficient documentation

## 2013-06-29 DIAGNOSIS — H60399 Other infective otitis externa, unspecified ear: Secondary | ICD-10-CM | POA: Insufficient documentation

## 2013-06-29 DIAGNOSIS — H6120 Impacted cerumen, unspecified ear: Secondary | ICD-10-CM | POA: Insufficient documentation

## 2013-06-29 DIAGNOSIS — H6090 Unspecified otitis externa, unspecified ear: Secondary | ICD-10-CM

## 2013-06-29 DIAGNOSIS — Y929 Unspecified place or not applicable: Secondary | ICD-10-CM | POA: Insufficient documentation

## 2013-06-29 NOTE — ED Notes (Signed)
Pt. reports hard of hearing for the past several weeks . Denies ear injury or discharge .

## 2013-06-30 MED ORDER — NEOMYCIN-POLYMYXIN-HC 3.5-10000-1 OT SUSP: 4.0000 [drp] | Freq: Four times a day (QID) | OTIC | Status: DC

## 2013-06-30 MED ORDER — DOCUSATE SODIUM 50 MG/5ML PO LIQD
50.0000 mg | Freq: Once | ORAL | Status: AC
  Administered 2013-06-30: 50 mg via OTIC
  Filled 2013-06-30: qty 10

## 2013-06-30 NOTE — Discharge Instructions (Signed)
1. Medications: cortisporin, usual home medications 2. Treatment: rest, drink plenty of fluids,  3. Follow Up: Please followup with your primary doctor for discussion of your diagnoses and further evaluation after today's visit; if you do not have a primary care doctor use the resource guide provided to find one;   Cerumen Impaction A cerumen impaction is when the wax in your ear forms a plug. This plug usually causes reduced hearing. Sometimes it also causes an earache or dizziness. Removing a cerumen impaction can be difficult and painful. The wax sticks to the ear canal. The canal is sensitive and bleeds easily. If you try to remove a heavy wax buildup with a cotton tipped swab, you may push it in further. Irrigation with water, suction, and small ear curettes may be used to clear out the wax. If the impaction is fixed to the skin in the ear canal, ear drops may be needed for a few days to loosen the wax. People who build up a lot of wax frequently can use ear wax removal products available in your local drugstore. SEEK MEDICAL CARE IF:  You develop an earache, increased hearing loss, or marked dizziness. Document Released: 03/12/2004 Document Revised: 04/27/2011 Document Reviewed: 05/02/2009 [facility] Patient Information 2014 [place], [place].   Otitis Externa Otitis externa is a bacterial or fungal infection of the outer ear canal. This is the area from the eardrum to the outside of the ear. Otitis externa is sometimes called "swimmer's ear." CAUSES  Possible causes of infection include:  Swimming in dirty water.  Moisture remaining in the ear after swimming or bathing.  Mild injury (trauma) to the ear.  Objects stuck in the ear (foreign body).  Cuts or scrapes (abrasions) on the outside of the ear. SYMPTOMS  The first symptom of infection is often itching in the ear canal. Later signs and symptoms may include swelling and redness of the ear canal, ear pain, and yellowish-white fluid  (pus) coming from the ear. The ear pain may be worse when pulling on the earlobe. DIAGNOSIS  Your caregiver will perform a physical exam. A sample of fluid may be taken from the ear and examined for bacteria or fungi. TREATMENT  Antibiotic ear drops are often given for 10 to 14 days. Treatment may also include pain medicine or corticosteroids to reduce itching and swelling. PREVENTION   Keep your ear dry. Use the corner of a towel to absorb water out of the ear canal after swimming or bathing.  Avoid scratching or putting objects inside your ear. This can damage the ear canal or remove the protective wax that lines the canal. This makes it easier for bacteria and fungi to grow.  Avoid swimming in lakes, polluted water, or poorly chlorinated pools.  You may use ear drops made of rubbing alcohol and vinegar after swimming. Combine equal parts of white vinegar and alcohol in a bottle. Put 3 or 4 drops into each ear after swimming. HOME CARE INSTRUCTIONS   Apply antibiotic ear drops to the ear canal as prescribed by your caregiver.  Only take over-the-counter or prescription medicines for pain, discomfort, or fever as directed by your caregiver.  If you have diabetes, follow any additional treatment instructions from your caregiver.  Keep all follow-up appointments as directed by your caregiver. SEEK MEDICAL CARE IF:   You have a fever.  Your ear is still red, swollen, painful, or draining pus after 3 days.  Your redness, swelling, or pain gets worse.  You have a  severe headache.  You have redness, swelling, pain, or tenderness in the area behind your ear. MAKE SURE YOU:   Understand these instructions.  Will watch your condition.  Will get help right away if you are not doing well or get worse. Document Released: 02/02/2005 Document Revised: 04/27/2011 Document Reviewed: 02/19/2011 [facility] Patient Information 2014 [place].   Emergency Department Resource Guide 1)  Find a Doctor and Pay Out of Pocket Although you won't have to find out who is covered by your insurance plan, it is a good idea to ask around and get recommendations. You will then need to call the office and see if the doctor you have chosen will accept you as a new patient and what types of options they offer for patients who are self-pay. Some doctors offer discounts or will set up payment plans for their patients who do not have insurance, but you will need to ask so you aren't surprised when you get to your appointment.  2) Contact Your Local Health Department Not all health departments have doctors that can see patients for sick visits, but many do, so it is worth a call to see if yours does. If you don't know where your local health department is, you can check in your phone book. The CDC also has a tool to help you locate your state's health department, and many state websites also have listings of all of their local health departments.  3) Find a Walk-in Clinic If your illness is not likely to be very severe or complicated, you may want to try a walk in clinic. These are popping up all over the country in pharmacies, drugstores, and shopping centers. They're usually staffed by nurse practitioners or physician assistants that have been trained to treat common illnesses and complaints. They're usually fairly quick and inexpensive. However, if you have serious medical issues or chronic medical problems, these are probably not your best option.  No Primary Care Doctor: - Call Health Connect at  [phone] - they can help you locate a primary care doctor that  accepts your insurance, provides certain services, etc. - Physician Referral Service- [phone]  Chronic Pain Problems: Organization         Address  Phone   Notes  [facility]  [phone] Patients need to be referred by their primary care doctor.   Medication Assistance: Organization         Address  Phone    Notes  [facility] [place] Medication [facility] Medical Corporationssistance Program [address] [place], [place] [address] [phone] --Must be a resident of [place] -- Must have NO insurance coverage whatsoever (no Medicaid/ Medicare, etc.) -- The pt. MUST have a primary care doctor that directs their care regularly and follows them in the community   MedAssist  [phone]   [employer]  [phone]    Agencies that provide inexpensive medical care: Organization         Address  Phone   Notes  [facility]  [phone]   [facility]    [phone]   [facility] [address] [place], [place] [address] [phone]   [facility] [address], [place] [phone]   Planned Parenthood    [phone]   [facility]    [phone]   [facility]  [address], [place] Phone:  [phone], Fax:  [phone] Hours of Operation:  9 am - 6 pm, M-F.  Also accepts Medicaid/Medicare and self-pay.  [facility] for [place], [address], [place] Phone: [phone], Fax: [phone]. Hours of Operation:  8:30 am - 5:30 pm, M-F.  Also accepts Medicaid and self-pay.  [facility] [place] [address], [place] Phone: [phone]   [place], [place], [place] [phone], Ext. [phone] Mondays & Thursdays: 7-9 AM.  First 15 patients are seen on a first come, first serve basis.    [place] Providers:  Organization         Address  Phone   Notes  [facility] [address], [place] [phone] Also accepts self-pay patients.  [facility] [address], [place]  [phone]   [place], [address], [place] [phone]   [facility]  [facility] [address] [place] [phone]   Lucianne Lei [address], [place]   [phone] Only accepts [place] Access [place] patients after they have their name applied to their card.   Self-Pay (no insurance) in [facility]:  Organization         Address  Phone   Notes  Sickle Cell Patients, [facility] [place] [phone]   [facility] [place] [phone]   [facility] [place]  [place], [place], [place] [phone]   [facility]/Dr. Osei-Bonsu  [address] [place] or [address], [place] [phone] Phone number for both [place] and [place] locations is the same.  [facility] [address] [place] [phone]   [facility] [address], [place] or [address] [phone]   [facility] [address] [place] [phone], phone; [phone], fax Sees patients 1st and 3rd Saturday of every month.  Must not qualify for public or private insurance (i.e. Medicaid, Medicare, [place] Health Choice, Veterans' Benefits)  Household income should be no more than 200% of the poverty level The clinic cannot treat you if you are pregnant or think you are pregnant  Sexually transmitted diseases are not treated at the clinic.    Dental Care: Organization         Address  Phone  Notes  [facility] [place] [phone] Accepts children up to age 41 who are enrolled in [place] or [place]; pregnant women with a Medicaid card; and children who have applied for Medicaid or [place] Health Choice, but were declined, whose parents can pay a reduced fee at time of service.  [facility] Department of [facility]  [address], [place] [phone] Accepts children up to age 71 who are enrolled in [place] or [place]; pregnant women with a Medicaid card; and children who have applied for Medicaid or [place] Health Choice, but were declined, whose parents can pay a reduced fee at time of service.  [place] Adult [facility] Access PROGRAM  [place] [phone] Patients are seen by appointment only. Walk-ins are  not accepted. [facility] will see patients 55 years of age and older. Monday - Tuesday (8am-5pm) Most Wednesdays (8:30-5pm) $30 per visit, cash only  [facility]  [address], [facility] [phone] Patients are seen by appointment only. Walk-ins are not accepted. [facility] will see patients 55 years of age and older. One Wednesday Evening (Monthly: Volunteer Based).  $30 per visit, cash only  [employer] [facility] [facility]  [phone] for adults; Children under age 524, call Graduate Pediatric Dentistry at [phone]. Children aged 514-14, please call [phone] to request a pediatric application.  Dental services are provided in all areas of dental care including fillings, crowns and bridges, complete and partial dentures, implants, gum treatment, root canals, and extractions. Preventive care is also provided. Treatment is provided to both adults and children. Patients are selected via a lottery and there is often a waiting list.   [facility] [address], [place]  [phone] [email or web address]   [facility] [address], [place], [place] [phone], Ext. [phone] Second and Fourth Thursday of each month, opens at 6:30 AM; Clinic ends at 9 AM.  Patients are seen on a first-come first-served basis, and a limited number are seen during each clinic.   [facility]  [address], [place], [place] [phone]   Eligibility Requirements You must have lived in  [place], [place], or [place] counties for at least the last three months.   You cannot be eligible for state or federal sponsored [place] Cityhealthcare insurance, including CIGNAVeterans Administration, [place]icaid, or [employer] EntertainmentMedicare.   You generally cannot be eligible for healthcare insurance through your employer.    How to apply: Eligibility screenings are held every Tuesday and Wednesday afternoon from 1:00 pm until 4:00 pm. You do not need an appointment for the interview!  [facility] [address], [place], [place] [phone]   [facility]  [phone]   [facility]  [phone]   [facility]  [phone]    Behavioral Health Resources in the Community: Intensive Outpatient Programs Organization         Address  Phone  Notes  [facility] [address], [place], [place] [phone]   [facility] Outpatient [address], [place], [place] [phone]   [facility] [address], [place], [place]  [phone]   [facility] [address],  [place], [place] [phone] or [phone]   Substance Abuse Resources Organization         Address  Phone  Notes  [facility] Drug Services  [phone]   [facility]  [phone]   [facility]  [phone]   [facility]  [phone]   Residential & Outpatient Substance Abuse Program  [phone]   Psychological Services Organization         Address  Phone  Notes  [facility]  [phone]- [phone]   [facility]  [phone]- [phone]   [facility] [address], [place] [phone] or [phone]    Mobile Crisis Teams Organization         Address  Phone  Notes  [facility], Mobile Crisis Care Unit  [phone]   [facility]  [address] [place], [place] [phone]   [facility] [address] [place] [phone]    Self-Help/Support  Groups Organization         Address  Phone             Notes  [facility] [place] - variety of support groups  [place] Call for more information  Narcotics Anonymous (NA), [facility]aring [facility] [address], [name] [place]  2 meetings at this location   Special educational needs teacher         Address  Phone  Notes  [facility] Residential Treatment [place],    [place]  [phone]   [facility]  [address], [place] 038882, [place], [place]   [place], [place] [phone] Admissions: 8am-3pm M-F  Incentives Substance [place] [address],    [place], [place] [phone]   [facility] [address] [place], [place], [place]   [facility] [address],  [place], [place] Community   Insight Programs - Intensive Outpatient [address], [place], [place]   [facility] [address]  [place], [place] [phone] or [phone]   [facility] Services (RTS) [address], [place], [place] Accepts Medicaid  [facility] [address],  [place] [phone] Substance Abuse/Addiction Treatment   [facility] Organization         Address  Phone  Notes  [facility]  [phone]   Domenic Schwab, PhD [address] Arlis Porta [place], [place]   [phone] or [phone]   [place], [place] [phone]   [facility] [address] [place], [place] [phone] Insurance/Medicaid/sponsorship through [facility] and Families [address], [address]                                    [place], [place] [phone] [place] [address] [place], [place] [phone]    Dr. Adele Schilder  [phone]   [facility] Dept. 1) [address] [place] 2) [place] 3)  [place] [address], [place] [phone]  [phone]   [place] [phone] or [phone] (After Hours)

## 2013-06-30 NOTE — ED Provider Notes (Signed)
Medical screening examination/treatment/procedure(s) were performed by non-physician practitioner and as supervising physician I was immediately available for consultation/collaboration.   EKG Interpretation None        Julianne Rice, MD 06/30/13 [phone]

## 2013-06-30 NOTE — ED Provider Notes (Signed)
CSN: [id number]     Arrival date & time 06/29/13  2256 History  This chart was scribed for non-physician practitioner Abigail Butts, PA-C, working with Julianne Rice, MD, by Neta Ehlers, ED Scribe. This patient was seen in room TR11C/TR11C and the patient's care was started at 12:23 AM.   First MD Initiated Contact with Patient 06/30/13 0019     No chief complaint on file.    The history is provided by the patient. No language interpreter was used.   HPI Comments: Lawrence Arias is a 53 y.o. male who presents to the Emergency Department complaining of decreased hearing which has gradually worsened for several weeks and which he attributes to cerumen impaction. He has attempted to clean his ears with Q-tips, peroxide, and a bobby pin. He reports that he was able to extract a lot of cerumen with the bobby pin from the right ear, but not much from the left. He also reports the hearing loss improved temporarily after the cerumen extraction. The pt voices he smokes a pack daily and drinks frequently; he drank two 40 ounces today. He denies daily medications.   Past Medical History  Diagnosis Date  . Hypertension    Past Surgical History  Procedure Laterality Date  . Fracture surgery     No family history on file. History  Substance Use Topics  . Smoking status: Current Every Day Smoker  . Smokeless tobacco: Not on file  . Alcohol Use: Yes     Comment: 2- 40 oz 5 times weekly    Review of Systems  Constitutional: Negative for fever, diaphoresis, appetite change, fatigue and unexpected weight change.  HENT: Positive for ear pain and hearing loss. Negative for mouth sores.   Eyes: Negative for visual disturbance.  Respiratory: Negative for cough, chest tightness, shortness of breath and wheezing.   Cardiovascular: Negative for chest pain.  Gastrointestinal: Negative for nausea, vomiting, abdominal pain, diarrhea and constipation.  Endocrine: Negative for polydipsia,  polyphagia and polyuria.  Genitourinary: Negative for dysuria, urgency, frequency and hematuria.  Musculoskeletal: Negative for back pain and neck stiffness.  Skin: Negative for rash.  Allergic/Immunologic: Negative for immunocompromised state.  Neurological: Negative for syncope, light-headedness and headaches.  Hematological: Does not bruise/bleed easily.  Psychiatric/Behavioral: Negative for sleep disturbance. The patient is not nervous/anxious.     Allergies  Review of patient's allergies indicates no known allergies.  Home Medications   Prior to Admission medications   Medication Sig Start Date End Date Taking? Authorizing Provider  Glucosamine-Chondroitin (OSTEO BI-FLEX REGULAR STRENGTH) 250-200 MG TABS Take 1 tablet by mouth daily.    Historical Provider, MD  oxyCODONE-acetaminophen (ROXICET) 5-325 MG per tablet Take 1 tablet by mouth every 4 (four) hours as needed for pain. 07/01/12   Tatyana A Kirichenko, PA-C   Triage Vitals: BP 142/98  Pulse 97  Temp(Src) 98 F (36.7 C) (Oral)  Resp 14  Ht 5\' 7"  (1.702 m)  Wt 139 lb (63.05 kg)  BMI 21.77 kg/m2  SpO2 98%  Physical Exam  Constitutional: He is oriented to person, place, and time. He appears well-developed and well-nourished. No distress.  HENT:  Head: Normocephalic and atraumatic.  Right Ear: Tympanic membrane and external ear normal.  Left Ear: External ear normal.  Nose: Nose normal. No epistaxis. Right sinus exhibits no maxillary sinus tenderness and no frontal sinus tenderness. Left sinus exhibits no maxillary sinus tenderness and no frontal sinus tenderness.  Mouth/Throat: Uvula is midline and mucous membranes are normal. Mucous membranes  are not pale and not cyanotic. No oropharyngeal exudate, posterior oropharyngeal edema, posterior oropharyngeal erythema or tonsillar abscesses.  Left TM has cerumen impaction.  Superficial abrasion to the right ear canal in 1 o'clock position.   Eyes: Conjunctivae are normal.  Pupils are equal, round, and reactive to light.  Neck: Normal range of motion and full passive range of motion without pain.  Cardiovascular: Normal rate and intact distal pulses.   Pulmonary/Chest: Effort normal and breath sounds normal. No stridor.  Abdominal: Soft. Bowel sounds are normal. There is no tenderness.  Musculoskeletal: Normal range of motion.  Lymphadenopathy:    He has no cervical adenopathy.  Neurological: He is alert and oriented to person, place, and time.  Skin: Skin is dry. No rash noted. He is not diaphoretic.  Psychiatric: He has a normal mood and affect.    ED Course  EAR CERUMEN REMOVAL Date/Time: 06/30/2013 2:22 AM Performed by: Abigail Butts Authorized by: Abigail Butts Consent: Verbal consent obtained. Risks and benefits: risks, benefits and alternatives were discussed Consent given by: patient Patient understanding: patient states understanding of the procedure being performed Patient consent: the patient's understanding of the procedure matches consent given Procedure consent: procedure consent matches procedure scheduled Relevant documents: relevant documents present and verified Site marked: the operative site was marked Required items: required blood products, implants, devices, and special equipment available Patient identity confirmed: verbally with patient and arm band Time out: Immediately prior to procedure a "time out" was called to verify the correct patient, procedure, equipment, support staff and site/side marked as required. Ceruminolytics applied: Ceruminolytics applied prior to the procedure. Location details: left ear Procedure type: curette and irrigation Patient sedated: no Patient tolerance: Patient tolerated the procedure well with no immediate complications. Comments: Complete disimpaction of the right ear canal   (including critical care time)  DIAGNOSTIC STUDIES: Oxygen Saturation is 98% on room air, normal by my  interpretation.    COORDINATION OF CARE:  12:27 AM- Discussed treatment plan with patient, and the patient agreed to the plan. The plan includes removal of cerumen.   Labs Review Labs Reviewed - No data to display  Imaging Review No results found.   EKG Interpretation None      MDM   Final diagnoses:  Cerumen impaction  Otitis externa   Hedy Camara presents for decreased hearing. Patient found to have cerumen impaction in the left ear and very mild otitis externa in the right.  Complete disimpaction of the right ear canal.  Reports hearing has returned to baseline afterwards.  Patient alert, oriented, nontoxic and nonseptic appearing. He is afebrile and not tachycardic. Patient does report drinking 2 bottles of beer, 40 ounces each.  Pt otitis externa mild enough that he does not need an ear wick, but will d/c home with abx.  Patient given strict instructions to avoid Q-tips and other small items in the ear. He is to followup with his primary care provider in 3 days for further evaluation. He is to return to the emergency department for high fevers, worsening ear pain or other concerning symptoms.  It has been determined that no acute conditions requiring further emergency intervention are present at this time. The patient/guardian have been advised of the diagnosis and plan. We have discussed signs and symptoms that warrant return to the ED, such as changes or worsening in symptoms.   Vital signs are stable at discharge.   BP 109/62  Pulse 89  Temp(Src) 98 F (36.7 C) (Oral)  Resp 12  Ht 5\' 7"  (1.702 m)  Wt 139 lb (63.05 kg)  BMI 21.77 kg/m2  SpO2 96%  Patient/guardian has voiced understanding and agreed to follow-up with the PCP or specialist.  I personally performed the services described in this documentation, which was scribed in my presence. The recorded information has been reviewed and is accurate.     Jarrett Soho Nayelie Gionfriddo, PA-C 06/30/13 [phone]

## 2014-08-16 ENCOUNTER — Emergency Department (HOSPITAL_COMMUNITY)
Admission: EM | Admit: 2014-08-16 | Discharge: 2014-08-16 | Disposition: A | Discharge status: Home / Self Care | Payer: Medicaid Other | Attending: Emergency Medicine | Admitting: Emergency Medicine

## 2014-08-16 ENCOUNTER — Emergency Department (HOSPITAL_COMMUNITY): Payer: Medicaid Other

## 2014-08-16 ENCOUNTER — Encounter (HOSPITAL_COMMUNITY): Payer: Self-pay

## 2014-08-16 DIAGNOSIS — Z720 Tobacco use: Secondary | ICD-10-CM | POA: Diagnosis not present

## 2014-08-16 DIAGNOSIS — Z79899 Other long term (current) drug therapy: Secondary | ICD-10-CM | POA: Insufficient documentation

## 2014-08-16 DIAGNOSIS — Y9389 Activity, other specified: Secondary | ICD-10-CM | POA: Insufficient documentation

## 2014-08-16 DIAGNOSIS — Y9289 Other specified places as the place of occurrence of the external cause: Secondary | ICD-10-CM | POA: Insufficient documentation

## 2014-08-16 DIAGNOSIS — R05 Cough: Secondary | ICD-10-CM | POA: Diagnosis not present

## 2014-08-16 DIAGNOSIS — S20212A Contusion of left front wall of thorax, initial encounter: Secondary | ICD-10-CM | POA: Insufficient documentation

## 2014-08-16 DIAGNOSIS — I10 Essential (primary) hypertension: Secondary | ICD-10-CM | POA: Diagnosis not present

## 2014-08-16 DIAGNOSIS — Y998 Other external cause status: Secondary | ICD-10-CM | POA: Diagnosis not present

## 2014-08-16 DIAGNOSIS — W01198A Fall on same level from slipping, tripping and stumbling with subsequent striking against other object, initial encounter: Secondary | ICD-10-CM | POA: Diagnosis not present

## 2014-08-16 DIAGNOSIS — S299XXA Unspecified injury of thorax, initial encounter: Secondary | ICD-10-CM | POA: Diagnosis present

## 2014-08-16 LAB — I-STAT CHEM 8, ED
BUN: 14 mg/dL (ref 6–20)
Calcium, Ion: 1.16 mmol/L (ref 1.12–1.23)
Chloride: 108 mmol/L (ref 101–111)
Creatinine, Ser: 1.1 mg/dL (ref 0.61–1.24)
Glucose, Bld: 78 mg/dL (ref 65–99)
HCT: 43 % (ref 39.0–52.0)
Hemoglobin: 14.6 g/dL (ref 13.0–17.0)
Potassium: 4.3 mmol/L (ref 3.5–5.1)
Sodium: 140 mmol/L (ref 135–145)
TCO2: 24 mmol/L (ref 0–100)

## 2014-08-16 MED ORDER — ONDANSETRON HCL 4 MG/2ML IJ SOLN
4.0000 mg | Freq: Once | INTRAMUSCULAR | Status: DC
  Filled 2014-08-16: qty 2

## 2014-08-16 MED ORDER — HYDROCODONE-ACETAMINOPHEN 5-325 MG PO TABS: 1.0000 | ORAL_TABLET | ORAL | Status: DC | PRN

## 2014-08-16 MED ORDER — FENTANYL CITRATE (PF) 100 MCG/2ML IJ SOLN
50.0000 ug | Freq: Once | INTRAMUSCULAR | Status: AC
  Administered 2014-08-16: 50 ug via INTRAVENOUS

## 2014-08-16 MED ORDER — ONDANSETRON HCL 4 MG/2ML IJ SOLN
4.0000 mg | Freq: Once | INTRAMUSCULAR | Status: AC
  Administered 2014-08-16: 4 mg via INTRAVENOUS

## 2014-08-16 MED ORDER — BENAZEPRIL HCL 10 MG PO TABS
10.0000 mg | ORAL_TABLET | Freq: Every day | ORAL | Status: DC
  Administered 2014-08-16: 10 mg via ORAL
  Filled 2014-08-16: qty 1

## 2014-08-16 MED ORDER — FENTANYL CITRATE (PF) 100 MCG/2ML IJ SOLN
50.0000 ug | Freq: Once | INTRAMUSCULAR | Status: DC
  Filled 2014-08-16: qty 2

## 2014-08-16 MED ORDER — BENAZEPRIL HCL 10 MG PO TABS: 10.0000 mg | ORAL_TABLET | Freq: Every day | ORAL | Status: DC

## 2014-08-16 MED ORDER — ONDANSETRON HCL 4 MG/2ML IJ SOLN: 4.0000 mg | Freq: Once | INTRAMUSCULAR | Status: DC

## 2014-08-16 NOTE — ED Notes (Signed)
This RN taught pt how to splint when coughing. The pt states "that helps a lot!".

## 2014-08-16 NOTE — ED Notes (Signed)
Requested Lotensin from pharmacy.  PA wishes pt to receive medication prior to discharge.

## 2014-08-16 NOTE — ED Notes (Signed)
Patient transported to X-ray 
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
## 2014-08-16 NOTE — Discharge Instructions (Signed)
Chest Contusion A chest contusion is a deep bruise on your chest area. Contusions are the result of an injury that caused bleeding under the skin. A chest contusion may involve bruising of the skin, muscles, or ribs. The contusion may turn blue, purple, or yellow. Minor injuries will give you a painless contusion, but more severe contusions may stay painful and swollen for a few weeks. CAUSES  A contusion is usually caused by a blow, trauma, or direct force to an area of the body. SYMPTOMS   Swelling and redness of the injured area.  Discoloration of the injured area.  Tenderness and soreness of the injured area.  Pain. DIAGNOSIS  The diagnosis can be made by taking a history and performing a physical exam. An X-ray, CT scan, or MRI may be needed to determine if there were any associated injuries, such as broken bones (fractures) or internal injuries. TREATMENT  Often, the best treatment for a chest contusion is resting, icing, and applying cold compresses to the injured area. Deep breathing exercises may be recommended to reduce the risk of pneumonia. Over-the-counter medicines may also be recommended for pain control. HOME CARE INSTRUCTIONS   Put ice on the injured area.  Put ice in a plastic bag.  Place a towel between your skin and the bag.  Leave the ice on for 15-20 minutes, 03-04 times a day.  Only take over-the-counter or prescription medicines as directed by your caregiver. Your caregiver may recommend avoiding anti-inflammatory medicines (aspirin, ibuprofen, and naproxen) for 48 hours because these medicines may increase bruising.  Rest the injured area.  Perform deep-breathing exercises as directed by your caregiver.  Stop smoking if you smoke.  Do not lift objects over 5 pounds (2.3 kg) for 3 days or longer if recommended by your caregiver. SEEK IMMEDIATE MEDICAL CARE IF:   You have increased bruising or swelling.  You have pain that is getting worse.  You have  difficulty breathing.  You have dizziness, weakness, or fainting.  You have blood in your urine or stool.  You cough up or vomit blood.  Your swelling or pain is not relieved with medicines. MAKE SURE YOU:   Understand these instructions.  Will watch your condition.  Will get help right away if you are not doing well or get worse. Document Released: 10/28/2000 Document Revised: 10/28/2011 Document Reviewed: 07/27/2011 [facility] Patient Information 2015 [place], [place]. This information is not intended to replace advice given to you by your health care provider. Make sure you discuss any questions you have with your health care provider.  Hypertension Hypertension, commonly called high blood pressure, is when the force of blood pumping through your arteries is too strong. Your arteries are the blood vessels that carry blood from your heart throughout your body. A blood pressure reading consists of a higher number over a lower number, such as 110/72. The higher number (systolic) is the pressure inside your arteries when your heart pumps. The lower number (diastolic) is the pressure inside your arteries when your heart relaxes. Ideally you want your blood pressure below 120/80. Hypertension forces your heart to work harder to pump blood. Your arteries may become narrow or stiff. Having hypertension puts you at risk for heart disease, stroke, and other problems.  RISK FACTORS Some risk factors for high blood pressure are controllable. Others are not.  Risk factors you cannot control include:   Race. You may be at higher risk if you are African American.  Age. Risk increases with age.  Gender. Men are at higher risk than women before age 64 years. After age 79, women are at higher risk than men. Risk factors you can control include:  Not getting enough exercise or physical activity.  Being overweight.  Getting too much fat, sugar, calories, or salt in your diet.  Drinking too much  alcohol. SIGNS AND SYMPTOMS Hypertension does not usually cause signs or symptoms. Extremely high blood pressure (hypertensive crisis) may cause headache, anxiety, shortness of breath, and nosebleed. DIAGNOSIS  To check if you have hypertension, your health care provider will measure your blood pressure while you are seated, with your arm held at the level of your heart. It should be measured at least twice using the same arm. Certain conditions can cause a difference in blood pressure between your right and left arms. A blood pressure reading that is higher than normal on one occasion does not mean that you need treatment. If one blood pressure reading is high, ask your health care provider about having it checked again. TREATMENT  Treating high blood pressure includes making lifestyle changes and possibly taking medicine. Living a healthy lifestyle can help lower high blood pressure. You may need to change some of your habits. Lifestyle changes may include:  Following the DASH diet. This diet is high in fruits, vegetables, and whole grains. It is low in salt, red meat, and added sugars.  Getting at least 2 hours of brisk physical activity every week.  Losing weight if necessary.  Not smoking.  Limiting alcoholic beverages.  Learning ways to reduce stress. If lifestyle changes are not enough to get your blood pressure under control, your health care provider may prescribe medicine. You may need to take more than one. Work closely with your health care provider to understand the risks and benefits. HOME CARE INSTRUCTIONS  Have your blood pressure rechecked as directed by your health care provider.   Take medicines only as directed by your health care provider. Follow the directions carefully. Blood pressure medicines must be taken as prescribed. The medicine does not work as well when you skip doses. Skipping doses also puts you at risk for problems.   Do not smoke.   Monitor your  blood pressure at home as directed by your health care provider. SEEK MEDICAL CARE IF:   You think you are having a reaction to medicines taken.  You have recurrent headaches or feel dizzy.  You have swelling in your ankles.  You have trouble with your vision. SEEK IMMEDIATE MEDICAL CARE IF:  You develop a severe headache or confusion.  You have unusual weakness, numbness, or feel faint.  You have severe chest or abdominal pain.  You vomit repeatedly.  You have trouble breathing. MAKE SURE YOU:   Understand these instructions.  Will watch your condition.  Will get help right away if you are not doing well or get worse. Document Released: 02/02/2005 Document Revised: 06/19/2013 Document Reviewed: 11/25/2012 [facility] Patient Information 2015 [place], [place]. This information is not intended to replace advice given to you by your health care provider. Make sure you discuss any questions you have with your health care provider.    Emergency Department Resource Guide 1) Find a Doctor and Pay Out of Pocket Although you won't have to find out who is covered by your insurance plan, it is a good idea to ask around and get recommendations. You will then need to call the office and see if the doctor you have chosen will accept you as  a new patient and what types of options they offer for patients who are self-pay. Some doctors offer discounts or will set up payment plans for their patients who do not have insurance, but you will need to ask so you aren't surprised when you get to your appointment.  2) Contact Your Local Health Department Not all health departments have doctors that can see patients for sick visits, but many do, so it is worth a call to see if yours does. If you don't know where your local health department is, you can check in your phone book. The CDC also has a tool to help you locate your state's health department, and many state websites also have listings of all of  their local health departments.  3) Find a [facility] If your illness is not likely to be very severe or complicated, you may want to try a walk in clinic. These are popping up all over the country in pharmacies, drugstores, and shopping centers. They're usually staffed by nurse practitioners or physician assistants that have been trained to treat common illnesses and complaints. They're usually fairly quick and inexpensive. However, if you have serious medical issues or chronic medical problems, these are probably not your best option.  No Primary Care Doctor: - Call Health Connect at  [phone] - they can help you locate a primary care doctor that  accepts your insurance, provides certain services, etc. - Physician Referral Service- [phone]  Chronic Pain Problems: Organization         Address  Phone   Notes  [facility]  [phone] Patients need to be referred by their primary care doctor.   Medication Assistance: Organization         Address  Phone   Notes  [facility] [address] [place], [place] [address] [phone] --Must be a resident of [facility] -- Must have NO insurance coverage whatsoever (no Medicaid/ Medicare, etc.) -- The pt. MUST have a primary care doctor that directs their care regularly and follows them in the community   MedAssist  [phone]   [employer]  [phone]    Agencies that provide inexpensive medical care: Organization         Address  Phone   Notes  [place]  [phone]   [facility]    [phone]   [facility] [place], [place] [address] [phone]   [place] [address], [place] [phone]   Planned Parenthood    [phone]   [facility]    [phone]   [place] and [address],  [place] Phone:  [phone], Fax:  [phone] Hours of Operation:  9 am - 6 pm, M-F.  Also accepts Medicaid/Medicare and self-pay.  [facility] for [place] [address], [address], [place] Phone: [phone], Fax: [phone]. Hours of Operation:  8:30 am - 5:30 pm, M-F.  Also accepts Medicaid and self-pay.  [facility] [place] [address] [place] Phone: [phone]   Tat [place], [place], [place] [phone], Ext. [phone] Mondays & Thursdays: 7-9 AM.  First 15 patients are seen on a first come, first serve basis.    [place] Providers:  Organization  Address  Phone   Notes  [facility] [address], [place] [phone] Also accepts self-pay patients.  [facility] [address], [place]  [phone]   [place], [address], [place] [phone]   [facility] [address], [place] [phone]   Lucianne Lei [address], [place]   [phone] Only accepts [place] Access [place] patients after they have their name applied to their card.   Self-Pay (no insurance) in [facility]:  Organization         Address  Phone   Notes  Sickle Cell Patients, [facility] [place] [phone]   [facility] [place] [phone]   [facility] [place]  [place], [place],  [phone]   [facility]/Dr. Osei-Bonsu  [address] [place] or [address], [place] [phone] Phone number for both [place] and [place] locations is the same.  [facility] [address], [place] [phone]   [facility] [address] [place] or [address] [phone]   [facility] [address], [place] [phone], phone; [phone], fax Sees patients 1st and 3rd Saturday of every month.  Must not qualify for public or private insurance (i.e. Medicaid, Medicare, [place] Health Choice, Veterans' Benefits)  Household income should be no more than 200% of the poverty level The clinic cannot treat you if you are pregnant or think you are pregnant  Sexually transmitted diseases are not treated at the clinic.    Dental Care: Organization         Address  Phone  Notes  [facility] Department of [facility] [place] [phone] Accepts children up to age 20 who are enrolled in [place] or [place]; pregnant women with a Medicaid card; and children who have applied for Medicaid or [place] Health Choice, but were declined, whose parents can pay a reduced fee at time of service.  [facility] of [facility]  [address], [place] [phone] Accepts children up to age 37 who are enrolled in [place] or [place]; pregnant women with a Medicaid card; and children who have applied for Medicaid or [place] Health Choice, but were declined, whose parents can pay a reduced fee at time of service.  [facility] Adult [facility] Access PROGRAM  [place] [phone] Patients are seen by appointment only. Walk-ins are not accepted. [place] will see patients 54 years of age and older. Monday - Tuesday (8am-5pm) Most Wednesdays (8:30-5pm) $30 per visit, cash only  [facility] Adult Dental Access PROGRAM  [address], [facility] [phone] Patients are seen by appointment only. Walk-ins are not accepted. [place] will see patients 57 years of age and older. One Wednesday Evening (Monthly: Volunteer Based).  $30 per visit, cash only  [place]   [phone] for adults; Children under age 87, call Graduate Pediatric Dentistry at [phone]. Children aged 76-14, please call [phone] to request a pediatric application.  Dental services are provided in all areas of dental care including  fillings, crowns and bridges, complete and partial dentures, implants, gum treatment, root canals, and extractions. Preventive care is also provided. Treatment is provided to both adults and children. Patients are selected via a lottery and there is often a waiting list.   [facility] [address] [place]  [phone] [email or web address]   [facility] [address] [place], [place] [phone], Ext. [phone] Second and Fourth Thursday of each month, opens at 6:30 AM; Clinic ends at 9 AM.  Patients are seen on a first-come first-served basis, and a limited number are seen during each clinic.   [facility]  [address] Hillard Danker [place], [place] [phone]   Eligibility Requirements You must have lived in [place], [place], or [place] counties for at least the last three months.   You cannot be eligible for state or federal sponsored [employer], including [employer], [place], or [employer].   You generally cannot be eligible for healthcare insurance through your employer.    How to apply: Eligibility screenings are held every Tuesday and Wednesday afternoon from 1:00 pm until 4:00 pm. You do not need an appointment for the interview!  [facility] [address], [place], [place]   [place]  [place] Department  [place]  [phone]    Behavioral Health Resources in the Community: Intensive Outpatient Programs Organization         Address  Phone  Notes  [place]. [address], [place], [place] [phone]   [facility] [address] [place], [place]   [facility]: [facility] [address] [place], [place]   [place] [address]  [place], [place] or [phone]   Substance Abuse Resources Organization         Address  Phone  Notes  Alcohol and Drug Services  [phone]   [place]  [phone]   [facility] [place]   Chinita Pester  [phone]   Residential & Outpatient Substance Abuse Program  4407154157   Psychological Services Organization         Address  Phone  Notes  [facility] [place]  [place]  [phone]   [place] [address], [place] or [phone]    Mobile Crisis Teams Organization         Address  Phone  Notes  [facility], Mobile Crisis Care Unit  [phone]   [facility]  [address] [place], [place]   [place] [address] [place] [phone]    Self-Help/Support Groups Organization         Address  Phone             Notes  [place] [facility] [place] - variety of support groups  [place] Call for more information  Narcotics Anonymous (NA), [facility] [address], [name] [place]  2 meetings at this location   Special educational needs teacher         Address  Phone  Notes  [facility] [place],    [place]  [phone]   [facility]  [address], [place] [address], [place], [place]   [place], [place]  [place] [phone] Admissions: 8am-3pm M-F  Incentives Substance [place] [address]    [place], [place] [phone]   [facility] [address] [place], [place], [place]   [facility] [address]  [place], [place]     [facility] Programs - Intensive Outpatient [address] Kristeen [address], [place], [place]   [facility] ([place].,  [place], [place] [phone] or [phone]   [facility] (RTS) [address] [place], [place] Accepts Medicaid  [place] [address]  [place] [phone] Substance Abuse/Addiction Treatment   [facility] Organization         Address  Phone  Notes  [facility]  [phone]   Domenic Schwab, PhD [address] Arlis Porta [place], [place]   [phone] or [phone]   [place], [place] [phone]   [facility] [address], [place], [place] [phone] Insurance/Medicaid/sponsorship through [facility] and Families [address]                                    [place], [place] [phone] [place] [address], [place] [phone]    Dr. Adele Schilder  [phone]   [facility] Dept. 1) [address] [place] 2) [place] 3)  [place] [address], [place] [phone]  [phone]   [place] [phone] or [phone] (After Hours)
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
## 2014-08-16 NOTE — ED Notes (Signed)
Almyra Free PA at bedside

## 2014-09-12 ENCOUNTER — Encounter (HOSPITAL_COMMUNITY): Payer: Self-pay | Admitting: Family Medicine

## 2014-09-12 ENCOUNTER — Emergency Department (HOSPITAL_COMMUNITY): Payer: Medicaid Other

## 2014-09-12 ENCOUNTER — Emergency Department (HOSPITAL_COMMUNITY)
Admission: EM | Admit: 2014-09-12 | Discharge: 2014-09-12 | Disposition: A | Discharge status: Home / Self Care | Payer: Medicaid Other | Attending: Emergency Medicine | Admitting: Emergency Medicine

## 2014-09-12 DIAGNOSIS — Z720 Tobacco use: Secondary | ICD-10-CM | POA: Insufficient documentation

## 2014-09-12 DIAGNOSIS — I10 Essential (primary) hypertension: Secondary | ICD-10-CM | POA: Insufficient documentation

## 2014-09-12 DIAGNOSIS — Z79899 Other long term (current) drug therapy: Secondary | ICD-10-CM | POA: Diagnosis not present

## 2014-09-12 DIAGNOSIS — R0781 Pleurodynia: Secondary | ICD-10-CM | POA: Diagnosis not present

## 2014-09-12 DIAGNOSIS — R0789 Other chest pain: Secondary | ICD-10-CM | POA: Diagnosis not present

## 2014-09-12 DIAGNOSIS — R51 Headache: Secondary | ICD-10-CM | POA: Insufficient documentation

## 2014-09-12 DIAGNOSIS — F1010 Alcohol abuse, uncomplicated: Secondary | ICD-10-CM | POA: Diagnosis not present

## 2014-09-12 DIAGNOSIS — R519 Headache, unspecified: Secondary | ICD-10-CM

## 2014-09-12 DIAGNOSIS — R05 Cough: Secondary | ICD-10-CM | POA: Insufficient documentation

## 2014-09-12 MED ORDER — HYDROCODONE-ACETAMINOPHEN 5-325 MG PO TABS
2.0000 | ORAL_TABLET | Freq: Once | ORAL | Status: AC
  Administered 2014-09-12: 2 via ORAL
  Filled 2014-09-12: qty 2

## 2014-09-12 NOTE — ED Notes (Signed)
Patient transported to CT 

## 2014-09-12 NOTE — ED Notes (Signed)
Pt here for headache x a few days. sts associated with his hypertension. sts he received medication 2 weeks ago but lost it. Pt 107/78 at triage.

## 2014-09-12 NOTE — Discharge Instructions (Signed)
Your blood pressure today here was normal. We don't feel that you need to be on blood pressure medication presently. Take Tylenol or Advil as directed for pain. Call the [place] today to get a primary care physician. Ask your new doctor to help you to stop smoking.
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
## 2015-04-12 ENCOUNTER — Emergency Department (HOSPITAL_COMMUNITY)
Admission: EM | Admit: 2015-04-12 | Discharge: 2015-04-12 | Disposition: A | Discharge status: Home / Self Care | Payer: Medicaid Other | Attending: Emergency Medicine | Admitting: Emergency Medicine

## 2015-04-12 ENCOUNTER — Encounter (HOSPITAL_COMMUNITY): Payer: Self-pay

## 2015-04-12 DIAGNOSIS — I10 Essential (primary) hypertension: Secondary | ICD-10-CM | POA: Diagnosis not present

## 2015-04-12 DIAGNOSIS — F17200 Nicotine dependence, unspecified, uncomplicated: Secondary | ICD-10-CM | POA: Insufficient documentation

## 2015-04-12 DIAGNOSIS — M5412 Radiculopathy, cervical region: Secondary | ICD-10-CM

## 2015-04-12 DIAGNOSIS — Z79899 Other long term (current) drug therapy: Secondary | ICD-10-CM | POA: Insufficient documentation

## 2015-04-12 DIAGNOSIS — R21 Rash and other nonspecific skin eruption: Secondary | ICD-10-CM

## 2015-04-12 LAB — I-STAT CHEM 8, ED
BUN: 15 mg/dL (ref 6–20)
CHLORIDE: 103 mmol/L (ref 101–111)
Calcium, Ion: 1.15 mmol/L (ref 1.12–1.23)
Creatinine, Ser: 1 mg/dL (ref 0.61–1.24)
GLUCOSE: 90 mg/dL (ref 65–99)
HCT: 42 % (ref 39.0–52.0)
Hemoglobin: 14.3 g/dL (ref 13.0–17.0)
Potassium: 3.5 mmol/L (ref 3.5–5.1)
Sodium: 138 mmol/L (ref 135–145)
TCO2: 23 mmol/L (ref 0–100)

## 2015-04-12 MED ORDER — METHOCARBAMOL 500 MG PO TABS: 500.0000 mg | ORAL_TABLET | Freq: Two times a day (BID) | ORAL | Status: DC | PRN

## 2015-04-12 MED ORDER — HYDROCHLOROTHIAZIDE 25 MG PO TABS: 25.0000 mg | ORAL_TABLET | Freq: Every day | ORAL | Status: DC

## 2015-04-12 MED ORDER — HYDROCHLOROTHIAZIDE 25 MG PO TABS
25.0000 mg | ORAL_TABLET | ORAL | Status: AC
  Administered 2015-04-12: 25 mg via ORAL
  Filled 2015-04-12: qty 1

## 2015-04-12 MED ORDER — PREDNISONE 10 MG (21) PO TBPK: 10.0000 mg | ORAL_TABLET | Freq: Every day | ORAL | Status: DC

## 2015-04-12 MED ORDER — PREDNISONE 20 MG PO TABS
60.0000 mg | ORAL_TABLET | Freq: Once | ORAL | Status: AC
  Administered 2015-04-12: 60 mg via ORAL
  Filled 2015-04-12: qty 3

## 2015-04-12 NOTE — ED Notes (Signed)
Pt. Given information on PCP

## 2015-04-12 NOTE — Discharge Instructions (Signed)
Read the information below.  Use the prescribed medication as directed.  Please discuss all new medications with your pharmacist.  You may return to the Emergency Department at any time for worsening condition or any new symptoms that concern you.     If you develop fevers, loss of control of bowel or bladder, weakness or numbness in your legs, or are unable to walk, return to the ER for a recheck.   If you develop redness, swelling, pus draining from the wound, or fevers greater than 100.4, return to the ER immediately for a recheck.    Cervical Radiculopathy Cervical radiculopathy means that a nerve in the neck is pinched or bruised. This can cause pain or loss of feeling (numbness) that runs from your neck to your arm and fingers. HOME CARE Managing Pain  Take over-the-counter and prescription medicines only as told by your doctor.  If directed, put ice on the injured or painful area.  Put ice in a plastic bag.  Place a towel between your skin and the bag.  Leave the ice on for 20 minutes, 2-3 times per day.  If ice does not help, you can try using heat. Take a warm shower or warm bath, or use a heat pack as told by your doctor.  You may try a gentle neck and shoulder massage. Activity  Rest as needed. Follow instructions from your doctor about any activities to avoid.  Do exercises as told by your doctor or physical therapist. General Instructions   If you were given a soft collar, wear it as told by your doctor.  Use a flat pillow when you sleep.  Keep all follow-up visits as told by your doctor. This is important. GET HELP IF:  Your condition does not improve with treatment. GET HELP RIGHT AWAY IF:   Your pain gets worse and is not controlled with medicine.  You lose feeling or feel weak in your hand, arm, face, or leg.  You have a fever.  You have a stiff neck.  You cannot control when you poop or pee (have incontinence).  You have trouble with walking, balance,  or talking.   This information is not intended to replace advice given to you by your health care provider. Make sure you discuss any questions you have with your health care provider.   Document Released: 01/22/2011 Document Revised: 10/24/2014 Document Reviewed: 03/29/2014 Elsevier Interactive Patient Education 2016 [employer].  Hypertension Hypertension is another name for high blood pressure. High blood pressure forces your heart to work harder to pump blood. A blood pressure reading has two numbers, which includes a higher number over a lower number (example: 110/72). HOME CARE   Have your blood pressure rechecked by your doctor.  Only take medicine as told by your doctor. Follow the directions carefully. The medicine does not work as well if you skip doses. Skipping doses also puts you at risk for problems.  Do not smoke.  Monitor your blood pressure at home as told by your doctor. GET HELP IF:  You think you are having a reaction to the medicine you are taking.  You have repeat headaches or feel dizzy.  You have puffiness (swelling) in your ankles.  You have trouble with your vision. GET HELP RIGHT AWAY IF:   You get a very bad headache and are confused.  You feel weak, numb, or faint.  You get chest or belly (abdominal) pain.  You throw up (vomit).  You cannot breathe very  well. MAKE SURE YOU:   Understand these instructions.  Will watch your condition.  Will get help right away if you are not doing well or get worse.   This information is not intended to replace advice given to you by your health care provider. Make sure you discuss any questions you have with your health care provider.   Document Released: 07/22/2007 Document Revised: 02/07/2013 Document Reviewed: 11/25/2012 Elsevier Interactive Patient Education 2016 [place]  The [employer] 211 is a great source of information about  community services available.  Access by dialing 2-1-1 from anywhere in [place], or by website -  CustodianSupply.fi.   Other Local Resources (Updated 02/2015)  [place]    Phone Number and Address  [facility] medical care - 1st and 3rd Saturday of every month  Must not qualify for public or private insurance and must have limited income [phone] [address], [place]  Child care  Emergency assistance for housing and [employer]  Medicaid [phone] [address], [place] [address]   [facility] Department  Low-cost medical care for children, communicable diseases, sexually-transmitted diseases, immunizations, maternity care, womens health and family planning [phone] [address], [place] [address]  [facility]   Medication assistance for [facility] residents  Must meet income requirements [phone] [place], [place].    [place]  Child care  Emergency assistance for housing and [employer]  Medicaid [phone] [address] [place], [place] [address]  Community Health and [place]   Low-cost medical care,   Monday through Friday, 9 am to 6 pm.   Accepts Medicare/Medicaid, and self-pay [phone] [address] [place], [place] [address]  [facility] for [place] care - Monday through Friday, 8:30 am - 5:30 pm  Accepts Medicaid and self-pay [phone] [address], [place], [place] [address]   [facility]  Primary medical care, including for those with sickle cell disease  Accepts Medicare, Medicaid, insurance and self-pay [phone], [place]  [facility]    Primary medical care  Accepts Medicare, [place], insurance and self-pay [phone] [address], [place], [place] [address]   [facility] Department of Social Services  Child care  Emergency assistance for housing and [employer]  Medicaid [phone] [place], [place] [address]  [place] Department of Health and [employer]  Child care  Emergency assistance for housing and [employer]  Medicaid [id number] [place], [place] [address]   [facility] Medication Assistance Program  Medication assistance for [facility] residents with no insurance only  Must have a primary care doctor [phone] [address], [place], [place]  [facility]   Primary medical care  [place], [place], insurance  [phone] [address] [place], [place]  [facility]   Medication assistance [phone]  [facility]   Primary medical care  Accepts Medicare, [place], insurance and self-pay [phone] [address],  [address]   Internal Medicine   Primary medical care  Accepts Medicare, [place], insurance and self-pay [phone] [address], [place]  [place] residents between the ages of 73 and 52 who do not have any form of health insurance, Medicare, [place], or [place] benefits.  Services are provided free of charge to uninsured patients who fall within federal poverty guidelines.    Hours: Tuesdays and Thursdays, 4:15 - 8 pm [phone] [address] [place], [place] [address]  [facility]     Primary medical care  Dental care  Nutritional counseling  Pharmacy  Accepts Medicaid, Medicare, most insurance.  Fees are adjusted based on ability to pay.   [place], [facility] [place] [address], [place], [place] [facility], [place], [place] [facility] [place], [place]  [facility]  Womens health and family planning [phone] [place]. [place], [place] care  Emergency assistance for housing and [employer]  Medicaid [phone] [address], [place], [place] [address]   [facility]    Ages 66 and older  Hours: Mondays and Thursdays, 7:00 am - 9:00 am Patients are seen on a first come, first served basis. [phone], ext. [place], [place]  Child care  Emergency assistance for housing and [employer]  Medicaid [phone] [address], [place] [address]  [facility]  Medication assistance  Rental assistance  Food pantry  Medication assistance  Housing assistance  Emergency food distribution  Utility assistance [place], [place]  [place]. [place], [place] [address] Hours: Tuesdays and Thursdays from 9am - 12 noon by appointment only  [address] [place], [place] [address]  [facility] [place] private insurance, [place], and [place].  Payment is based on a sliding scale for those without insurance.  Hours: Mondays, Tuesdays and Thursdays, 8:30 am - 5:30 pm.   [phone] [place], [place]  [facility] - Family Medicine at [facility], [place], and [place].  Payment is based on a sliding scale for  those without insurance. [phone] [address], [place]  [facility] - Pediatrics at [address], research (medical), [employer], and [place].  Payment is based on a sliding scale for those without insurance [phone] [address], [name], [place]  [facility] - Pediatrics at [employer], [place], and [place].  Payment is based on a sliding scale for those without insurance. [phone] [place], [place]  [facility] - Pediatrics at [facility], [place], and [place].  Payment is based on a sliding scale for those without insurance. [phone], ext. [phone] [place], [place].    [place] care.  Accepts Medicaid and self-pay. [place], [place]

## 2015-04-12 NOTE — ED Notes (Signed)
Pt. Has a rash all over his torso and bilateral arms.  Developed 2 weeks ago. Also having lt. Lateral neck pain radiates into his shoulder. Denies any injury

## 2015-04-12 NOTE — ED Provider Notes (Signed)
CSN: [id number]     Arrival date & time 04/12/15  1549 History  By signing my name below, I, Irene Pap, attest that this documentation has been prepared under the direction and in the presence of Raylen Ken, PA-C. Electronically Signed: Irene Pap, ED Scribe. 04/12/2015. 6:50 PM.   Chief Complaint  Patient presents with  . Rash   The history is provided by the patient. No language interpreter was used.  HPI Comments: Lawrence Arias is a 55 y.o. male with a hx of HTN who presents to the Emergency Department complaining of an itching, gradually worsening rash to the bilateral upper arms and torso onset 2 weeks ago. The rash is now beginning to spread to the bilateral thighs. He believes that he has changed soap recently, but denies any other lifestyle changes or exposures.   Pt states that he is also having constant, sharp, burning, throbbing left shoulder pain onset 4 days ago. He reports that he "cracked" his neck and the pain began to shoot across his back into his left posterior shoulder. He reports worsening pain with certain movements of the neck and arm.   Pt does not take anything for his HTN. He does not have a regular doctor. He denies drainage from the rash, hx of similar symptoms, fever, chills, chest pain, SOB, numbness, or weakness.  Past Medical History  Diagnosis Date  . Hypertension    Past Surgical History  Procedure Laterality Date  . Fracture surgery     No family history on file. Social History  Substance Use Topics  . Smoking status: Current Every Day Smoker  . Smokeless tobacco: None  . Alcohol Use: Yes     Comment: 2- 40 oz 5 times weekly    Review of Systems  Constitutional: Negative for fever and chills.  Respiratory: Negative for shortness of breath.   Cardiovascular: Negative for chest pain.  Musculoskeletal: Positive for arthralgias and neck pain.  Skin: Positive for rash.  Allergic/Immunologic: Negative for immunocompromised state.   Neurological: Negative for weakness and numbness.  Psychiatric/Behavioral: Negative for self-injury.   Allergies  Review of patient's allergies indicates no known allergies.  Home Medications   Prior to Admission medications   Medication Sig Start Date End Date Taking? Authorizing Provider  benazepril (LOTENSIN) 10 MG tablet Take 1 tablet (10 mg total) by mouth daily. 08/16/14   Evalee Jefferson, PA-C  Glucosamine-Chondroitin (OSTEO BI-FLEX REGULAR STRENGTH) 250-200 MG TABS Take 1 tablet by mouth daily.    Historical Provider, MD  HYDROcodone-acetaminophen (NORCO/VICODIN) 5-325 MG per tablet Take 1 tablet by mouth every 4 (four) hours as needed for moderate pain. 08/16/14   Evalee Jefferson, PA-C  ibuprofen (ADVIL,MOTRIN) 200 MG tablet Take 200 mg by mouth every 6 (six) hours as needed for mild pain.    Historical Provider, MD  neomycin-polymyxin-hydrocortisone (CORTISPORIN) 3.5-10000-1 otic suspension Place 4 drops into the right ear 4 (four) times daily. X 7 days 06/30/13   Jarrett Soho Muthersbaugh, PA-C  oxyCODONE-acetaminophen (ROXICET) 5-325 MG per tablet Take 1 tablet by mouth every 4 (four) hours as needed for pain. 07/01/12   Tatyana Kirichenko, PA-C   BP 156/127 mmHg  Pulse 94  Temp(Src) 99 F (37.2 C)  Resp 18  Ht 5\' 7"  (1.702 m)  Wt 145 lb (65.772 kg)  BMI 22.71 kg/m2  SpO2 98% Physical Exam  Constitutional: He appears well-developed and well-nourished. No distress.  HENT:  Head: Normocephalic and atraumatic.  Eyes: Conjunctivae are normal.  Neck: Normal range of  motion. Neck supple.  Cardiovascular: Normal rate.   Pulmonary/Chest: Effort normal.  Musculoskeletal:       Left shoulder: Normal. He exhibits normal range of motion, no tenderness, no bony tenderness, no swelling, no effusion, no crepitus, no deformity, no laceration, no spasm, normal pulse and normal strength.  Spine nontender, no crepitus, or stepoffs. Upper extremities:  Strength 5/5, sensation intact, distal pulses intact.     Neurological: He is alert. He exhibits normal muscle tone.  Skin: He is not diaphoretic.  Diffuse round and discoid scaly lesions without discharge, of varying sizes  Psychiatric: He has a normal mood and affect. His behavior is normal.  Nursing note and vitals reviewed.       ED Course  Procedures (including critical care time) DIAGNOSTIC STUDIES: Oxygen Saturation is 98% on RA, normal by my interpretation.    COORDINATION OF CARE: 4:38 PM-Discussed treatment plan with pt at bedside and pt agreed to plan.    Labs Review Labs Reviewed - No data to display  Imaging Review No results found. I have personally reviewed and evaluated these images and lab results as part of my medical decision-making.   EKG Interpretation None      MDM   Final diagnoses:  Cervical radiculopathy  Essential hypertension  Rash    Afebrile nontoxic patient with several complaints.  Has had scaly pruritic rash over entire upper body with no associated symptoms x 2.5 weeks.  Pt rash also examined by Dr Ralene Bathe.  Unsure of etiology of rash.  Doubt tinea, scabies.  RPR, HIV pending.  No mucosal lesions.  Pt also with what is likely radicular pain vs muscle spasm of left upper back.  No weakness/numbness, neurologic deficits.  Pt is known to be hypertensive but takes no medications.  Chem 8 reassuring.  HCTZ given in ED and d/c home with same.  Pt given multiple resources for PCP/orange card/community resources and referral to dermatology.  Discussed result, findings, treatment, and follow up  with patient.  Pt given return precautions.  Pt verbalizes understanding and agrees with plan.        I personally performed the services described in this documentation, which was scribed in my presence. The recorded information has been reviewed and is accurate.   Clayton Bibles, PA-C 04/12/15 1923  Quintella Reichert, MD 04/15/15 [phone]

## 2015-04-12 NOTE — ED Notes (Signed)
Pt.s acuity level changed due to pt.s elevated Blood pressure that is not being treated

## 2015-04-13 LAB — RPR: RPR: NONREACTIVE

## 2015-04-13 LAB — HIV ANTIBODY (ROUTINE TESTING W REFLEX): HIV SCREEN 4TH GENERATION: NONREACTIVE

## 2015-09-15 ENCOUNTER — Encounter (HOSPITAL_COMMUNITY): Payer: Self-pay | Admitting: Emergency Medicine

## 2015-09-15 ENCOUNTER — Emergency Department (HOSPITAL_COMMUNITY)
Admission: EM | Admit: 2015-09-15 | Discharge: 2015-09-15 | Disposition: A | Discharge status: Home / Self Care | Payer: Medicaid Other | Attending: Emergency Medicine | Admitting: Emergency Medicine

## 2015-09-15 ENCOUNTER — Emergency Department (HOSPITAL_COMMUNITY): Payer: Medicaid Other

## 2015-09-15 DIAGNOSIS — Z79899 Other long term (current) drug therapy: Secondary | ICD-10-CM | POA: Insufficient documentation

## 2015-09-15 DIAGNOSIS — F17210 Nicotine dependence, cigarettes, uncomplicated: Secondary | ICD-10-CM | POA: Diagnosis not present

## 2015-09-15 DIAGNOSIS — Y939 Activity, unspecified: Secondary | ICD-10-CM | POA: Diagnosis not present

## 2015-09-15 DIAGNOSIS — S4992XA Unspecified injury of left shoulder and upper arm, initial encounter: Secondary | ICD-10-CM | POA: Diagnosis present

## 2015-09-15 DIAGNOSIS — Y929 Unspecified place or not applicable: Secondary | ICD-10-CM | POA: Diagnosis not present

## 2015-09-15 DIAGNOSIS — S50312A Abrasion of left elbow, initial encounter: Secondary | ICD-10-CM | POA: Insufficient documentation

## 2015-09-15 DIAGNOSIS — Y999 Unspecified external cause status: Secondary | ICD-10-CM | POA: Diagnosis not present

## 2015-09-15 DIAGNOSIS — S42035A Nondisplaced fracture of lateral end of left clavicle, initial encounter for closed fracture: Secondary | ICD-10-CM | POA: Insufficient documentation

## 2015-09-15 DIAGNOSIS — I10 Essential (primary) hypertension: Secondary | ICD-10-CM | POA: Diagnosis not present

## 2015-09-15 DIAGNOSIS — S80212A Abrasion, left knee, initial encounter: Secondary | ICD-10-CM | POA: Insufficient documentation

## 2015-09-15 MED ORDER — OXYCODONE-ACETAMINOPHEN 5-325 MG PO TABS: 1.0000 | ORAL_TABLET | Freq: Four times a day (QID) | ORAL | 0 refills | Status: DC | PRN

## 2015-09-15 MED ORDER — OXYCODONE-ACETAMINOPHEN 5-325 MG PO TABS
1.0000 | ORAL_TABLET | Freq: Once | ORAL | Status: AC
  Administered 2015-09-15: 1 via ORAL
  Filled 2015-09-15: qty 1

## 2015-09-15 MED ORDER — METHOCARBAMOL 500 MG PO TABS
1000.0000 mg | ORAL_TABLET | Freq: Once | ORAL | Status: AC
  Administered 2015-09-15: 1000 mg via ORAL
  Filled 2015-09-15: qty 2

## 2015-09-15 MED ORDER — METHOCARBAMOL 500 MG PO TABS: 500.0000 mg | ORAL_TABLET | Freq: Two times a day (BID) | ORAL | 0 refills | Status: DC

## 2015-09-15 MED ORDER — KETOROLAC TROMETHAMINE 60 MG/2ML IM SOLN
60.0000 mg | Freq: Once | INTRAMUSCULAR | Status: AC
  Administered 2015-09-15: 60 mg via INTRAMUSCULAR
  Filled 2015-09-15: qty 2

## 2015-09-15 MED ORDER — NAPROXEN 500 MG PO TABS: 500.0000 mg | ORAL_TABLET | Freq: Two times a day (BID) | ORAL | 0 refills | Status: DC
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
## 2015-11-19 ENCOUNTER — Emergency Department (HOSPITAL_COMMUNITY): Payer: Medicaid Other

## 2015-11-19 ENCOUNTER — Encounter (HOSPITAL_COMMUNITY): Payer: Self-pay | Admitting: Emergency Medicine

## 2015-11-19 ENCOUNTER — Emergency Department (HOSPITAL_COMMUNITY)
Admission: EM | Admit: 2015-11-19 | Discharge: 2015-11-19 | Disposition: A | Discharge status: Home / Self Care | Payer: Medicaid Other | Attending: Emergency Medicine | Admitting: Emergency Medicine

## 2015-11-19 DIAGNOSIS — Z79899 Other long term (current) drug therapy: Secondary | ICD-10-CM | POA: Insufficient documentation

## 2015-11-19 DIAGNOSIS — F17210 Nicotine dependence, cigarettes, uncomplicated: Secondary | ICD-10-CM | POA: Diagnosis not present

## 2015-11-19 DIAGNOSIS — I10 Essential (primary) hypertension: Secondary | ICD-10-CM | POA: Insufficient documentation

## 2015-11-19 DIAGNOSIS — J40 Bronchitis, not specified as acute or chronic: Secondary | ICD-10-CM | POA: Insufficient documentation

## 2015-11-19 DIAGNOSIS — R05 Cough: Secondary | ICD-10-CM | POA: Diagnosis present

## 2015-11-19 MED ORDER — PREDNISONE 20 MG PO TABS
60.0000 mg | ORAL_TABLET | Freq: Once | ORAL | Status: AC
  Administered 2015-11-19: 60 mg via ORAL
  Filled 2015-11-19: qty 3

## 2015-11-19 MED ORDER — ALBUTEROL SULFATE HFA 108 (90 BASE) MCG/ACT IN AERS
2.0000 | INHALATION_SPRAY | RESPIRATORY_TRACT | Status: DC | PRN
  Administered 2015-11-19: 2 via RESPIRATORY_TRACT
  Filled 2015-11-19: qty 6.7

## 2015-11-19 MED ORDER — DOXYCYCLINE HYCLATE 100 MG PO CAPS: 100.0000 mg | ORAL_CAPSULE | Freq: Two times a day (BID) | ORAL | 0 refills | Status: DC

## 2015-11-19 MED ORDER — PREDNISONE 10 MG PO TABS: 20.0000 mg | ORAL_TABLET | Freq: Every day | ORAL | 0 refills | Status: DC

## 2015-11-19 MED ORDER — AEROCHAMBER PLUS FLO-VU LARGE MISC
1.0000 | Freq: Once | Status: AC
  Administered 2015-11-19: 1
  Filled 2015-11-19: qty 1

## 2015-11-19 NOTE — ED Triage Notes (Addendum)
Cough and flu like symptoms for weeks; dark spots on back that resemble bruising. He states they itch. States been feeling febrile. Nose isnt congested and throat does not hurt. States "he must be sick if he doesn't feel like drinking a beer"

## 2015-11-19 NOTE — ED Notes (Signed)
Patient states symptoms are same as when checked in.

## 2015-11-19 NOTE — ED Provider Notes (Signed)
[place] DEPT Provider Note   CSN: [id number] Arrival date & time: 11/19/15  1322     History   Chief Complaint Chief Complaint  Patient presents with  . Cough    HPI JAY RADACH is a 55 y.o. male.  HPI 55 y.o. Male with history of hypertension presents complaining of body aches, subjective fever, and cough.  States symptoms began 4 days ago. Cough productive of green sputum, patient with some mild dyspnea Past Medical History:  Diagnosis Date  . Hypertension     There are no active problems to display for this patient.   Past Surgical History:  Procedure Laterality Date  . FRACTURE SURGERY         Home Medications    Prior to Admission medications   Medication Sig Start Date End Date Taking? Authorizing Provider  amLODipine (NORVASC) 5 MG tablet Take 5 mg by mouth daily. 10/11/15  Yes Historical Provider, MD  Phenyleph-CPM-DM-APAP (ALKA-SELTZER PLUS COLD & COUGH) 06-17-08-325 MG CAPS Take 2 capsules by mouth daily as needed (cold).   Yes Historical Provider, MD  benazepril (LOTENSIN) 10 MG tablet Take 1 tablet (10 mg total) by mouth daily. 08/16/14   Evalee Jefferson, PA-C  Glucosamine-Chondroitin (OSTEO BI-FLEX REGULAR STRENGTH) 250-200 MG TABS Take 1 tablet by mouth daily.    Historical Provider, MD  hydrochlorothiazide (HYDRODIURIL) 25 MG tablet Take 1 tablet (25 mg total) by mouth daily. 04/12/15   Clayton Bibles, PA-C  HYDROcodone-acetaminophen (NORCO/VICODIN) 5-325 MG per tablet Take 1 tablet by mouth every 4 (four) hours as needed for moderate pain. 08/16/14   Evalee Jefferson, PA-C  ibuprofen (ADVIL,MOTRIN) 200 MG tablet Take 200 mg by mouth every 6 (six) hours as needed for mild pain.    Historical Provider, MD  methocarbamol (ROBAXIN) 500 MG tablet Take 1 tablet (500 mg total) by mouth 2 (two) times daily as needed for muscle spasms (and pain). 04/12/15   Clayton Bibles, PA-C  methocarbamol (ROBAXIN) 500 MG tablet Take 1 tablet (500 mg total) by mouth 2 (two) times  daily. 09/15/15   Shawn C Joy, PA-C  naproxen (NAPROSYN) 500 MG tablet Take 1 tablet (500 mg total) by mouth 2 (two) times daily. 09/15/15   Shawn C Joy, PA-C  neomycin-polymyxin-hydrocortisone (CORTISPORIN) 3.5-10000-1 otic suspension Place 4 drops into the right ear 4 (four) times daily. X 7 days 06/30/13   Jarrett Soho Muthersbaugh, PA-C  oxyCODONE-acetaminophen (PERCOCET/ROXICET) 5-325 MG tablet Take 1 tablet by mouth every 6 (six) hours as needed for severe pain. 09/15/15   Shawn C Joy, PA-C  predniSONE (STERAPRED UNI-PAK 21 TAB) 10 MG (21) TBPK tablet Take 1 tablet (10 mg total) by mouth daily. Day 1: take 6 tabs.  Day 2: 5 tabs  Day 3: 4 tabs  Day 4: 3 tabs  Day 5: 2 tabs  Day 6: 1 tab 04/12/15   Clayton Bibles, PA-C    Family History Family History  Problem Relation Age of Onset  . Hypertension Mother   . Hypertension Father     Social History Social History  Substance Use Topics  . Smoking status: Current Every Day Smoker    Types: Cigarettes  . Smokeless tobacco: Never Used  . Alcohol use Yes     Comment: 2- 40 oz 5 times weekly - Drinks daily     Allergies   Review of patient's allergies indicates no known allergies.   Review of Systems Review of Systems  All other systems reviewed and are negative.  Physical Exam Updated Vital Signs BP 155/96 (BP Location: Right Arm)   Pulse 119   Temp 98.8 F (37.1 C) (Oral)   Resp 22   Ht 5' 7.5" (1.715 m)   Wt 63.5 kg   SpO2 94%   BMI 21.60 kg/m   Physical Exam  Constitutional: He is oriented to person, place, and time. He appears well-developed and well-nourished.  HENT:  Head: Normocephalic and atraumatic.  Right Ear: External ear normal.  Left Ear: External ear normal.  Nose: Nose normal.  Mouth/Throat: Oropharynx is clear and moist.  Eyes: EOM are normal. Pupils are equal, round, and reactive to light.  Neck: Normal range of motion. Neck supple.  Cardiovascular: Normal rate, regular rhythm, normal heart sounds and  intact distal pulses.   Pulmonary/Chest: Effort normal.  Scattered rhonchi  Abdominal: Soft. Bowel sounds are normal.  Musculoskeletal: Normal range of motion.  Neurological: He is alert and oriented to person, place, and time.  Skin: Skin is warm and dry. Capillary refill takes less than 2 seconds.  Psychiatric: He has a normal mood and affect.  Nursing note and vitals reviewed.    ED Treatments / Results  Labs (all labs ordered are listed, but only abnormal results are displayed) Labs Reviewed - No data to display  EKG  EKG Interpretation  Date/Time:  Tuesday November 19 2015 16:31:13 EDT Ventricular Rate:  105 PR Interval:    QRS Duration: 94 QT Interval:  335 QTC Calculation: 443 R Axis:   73 Text Interpretation:  Sinus tachycardia Probable left atrial enlargement Baseline wander in lead(s) II III aVF Poor data quality in current ECG precludes serial comparison Confirmed by Edwinna Rochette MD, Andee Poles [phone]) on 11/19/2015 4:48:01 PM       Radiology Dg Chest 2 View  Result Date: 11/19/2015 CLINICAL DATA:  Productive cough, shortness of breath for 2 weeks. EXAM: CHEST  2 VIEW COMPARISON:  08/16/2014 FINDINGS: Heart and mediastinal contours are within normal limits. No focal opacities or effusions. No acute bony abnormality. Old healed bilateral rib fractures. IMPRESSION: No active cardiopulmonary disease. Electronically Signed   By: Rolm Baptise M.D.   On: 11/19/2015 14:18    Procedures Procedures (including critical care time)  Medications Ordered in ED Medications  albuterol (PROVENTIL HFA;VENTOLIN HFA) 108 (90 Base) MCG/ACT inhaler 2 puff (not administered)  AEROCHAMBER PLUS FLO-VU LARGE MISC 1 each (not administered)  predniSONE (DELTASONE) tablet 60 mg (not administered)     Initial Impression / Assessment and Plan / ED Course  I have reviewed the triage vital signs and the nursing notes.  Pertinent labs & imaging results that were available during my care of the  patient were reviewed by me and considered in my medical decision making (see chart for details).  Clinical Course   55 year old male smoker presents with cough and URI symptoms. Oxygen saturations are 98% after inhaler and prednisone. Heart rate is 105. Patient has chest x-Valkyrie Guardiola that shows no evidence of focal infiltrate. Plan albuterol HFA, prednisone, and doxycycline. Patient advised of return cautions and voices understanding.Smoking cessation counseling given  Final Clinical Impressions(s) / ED Diagnoses   Final diagnoses:  Bronchitis    New Prescriptions New Prescriptions   No medications on file     Pattricia Boss, MD 11/19/15 1651

## 2015-11-19 NOTE — ED Notes (Signed)
Pt is in stable condition upon d/c and ambulates from ED. 

## 2015-11-20 MED FILL — Spacer/Aerosol-Holding Chambers - Device: Qty: 1 | Status: AC

## 2016-05-21 ENCOUNTER — Emergency Department (HOSPITAL_COMMUNITY)
Admission: EM | Admit: 2016-05-21 | Discharge: 2016-05-21 | Discharge status: Against Medical Advice | Payer: Medicaid Other | Attending: Emergency Medicine | Admitting: Emergency Medicine

## 2016-05-21 ENCOUNTER — Encounter (HOSPITAL_COMMUNITY): Payer: Self-pay | Admitting: Emergency Medicine

## 2016-05-21 ENCOUNTER — Emergency Department (HOSPITAL_COMMUNITY): Payer: Medicaid Other

## 2016-05-21 DIAGNOSIS — F1010 Alcohol abuse, uncomplicated: Secondary | ICD-10-CM | POA: Diagnosis not present

## 2016-05-21 DIAGNOSIS — M25552 Pain in left hip: Secondary | ICD-10-CM | POA: Insufficient documentation

## 2016-05-21 DIAGNOSIS — R945 Abnormal results of liver function studies: Secondary | ICD-10-CM | POA: Insufficient documentation

## 2016-05-21 DIAGNOSIS — K146 Glossodynia: Secondary | ICD-10-CM | POA: Diagnosis not present

## 2016-05-21 DIAGNOSIS — F17210 Nicotine dependence, cigarettes, uncomplicated: Secondary | ICD-10-CM | POA: Diagnosis not present

## 2016-05-21 DIAGNOSIS — I10 Essential (primary) hypertension: Secondary | ICD-10-CM | POA: Insufficient documentation

## 2016-05-21 LAB — CBC WITH DIFFERENTIAL/PLATELET
BASOS ABS: 0.1 10*3/uL (ref 0.0–0.1)
Basophils Relative: 2 %
EOS PCT: 1 %
Eosinophils Absolute: 0 10*3/uL (ref 0.0–0.7)
HCT: 35.9 % — ABNORMAL LOW (ref 39.0–52.0)
Hemoglobin: 13.2 g/dL (ref 13.0–17.0)
Lymphocytes Relative: 33 %
Lymphs Abs: 1.3 10*3/uL (ref 0.7–4.0)
MCH: 34.9 pg — ABNORMAL HIGH (ref 26.0–34.0)
MCHC: 36.8 g/dL — ABNORMAL HIGH (ref 30.0–36.0)
MCV: 95 fL (ref 78.0–100.0)
Monocytes Absolute: 0.3 10*3/uL (ref 0.1–1.0)
Monocytes Relative: 6 %
Neutro Abs: 2.3 10*3/uL (ref 1.7–7.7)
Neutrophils Relative %: 58 %
PLATELETS: 275 10*3/uL (ref 150–400)
RBC: 3.78 MIL/uL — AB (ref 4.22–5.81)
RDW: 18.1 % — ABNORMAL HIGH (ref 11.5–15.5)
WBC: 3.9 10*3/uL — AB (ref 4.0–10.5)

## 2016-05-21 LAB — COMPREHENSIVE METABOLIC PANEL
ALT: 381 U/L — ABNORMAL HIGH (ref 17–63)
ANION GAP: 17 — AB (ref 5–15)
AST: 662 U/L — ABNORMAL HIGH (ref 15–41)
Albumin: 4.1 g/dL (ref 3.5–5.0)
Alkaline Phosphatase: 237 U/L — ABNORMAL HIGH (ref 38–126)
BILIRUBIN TOTAL: 1.7 mg/dL — AB (ref 0.3–1.2)
BUN: 12 mg/dL (ref 6–20)
CO2: 19 mmol/L — ABNORMAL LOW (ref 22–32)
Calcium: 9.5 mg/dL (ref 8.9–10.3)
Chloride: 93 mmol/L — ABNORMAL LOW (ref 101–111)
Creatinine, Ser: 0.98 mg/dL (ref 0.61–1.24)
Glucose, Bld: 87 mg/dL (ref 65–99)
Potassium: 5.2 mmol/L — ABNORMAL HIGH (ref 3.5–5.1)
Sodium: 129 mmol/L — ABNORMAL LOW (ref 135–145)
TOTAL PROTEIN: 8.4 g/dL — AB (ref 6.5–8.1)
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
## 2016-05-21 NOTE — ED Provider Notes (Signed)
[facility] DEPT Provider Note   CSN: [id number] Arrival date & time: 05/21/16  1604   By signing my name below, I, Lawrence Arias, attest that this documentation has been prepared under the direction and in the presence of Orlie Dakin, MD. Electronically Signed: Soijett Arias, ED Scribe. 05/21/16. 6:00 PM.  History   Chief Complaint Chief Complaint  Patient presents with  . Thrush    HPI Lawrence Arias is a 56 y.o. male with a PMHx of HTN, who presents to the Emergency Department complaining of lesion to left side of tongue x burning sensation onset 3 months ago. Pt reports associated left sided facial pain x last night, cough x 3 months worsened at night, and left hip pain x 3-4 months. Pt has not tried any medications for the relief of his symptoms. He reports that he has been tested for HIV with the last testing being 3 months ago and returning negative. He denies weight loss, swelling, facial swelling, and any other symptoms. No treatment prior to coming here. Other associated symptoms include cough for 3 months and hip pain for several months, unchanged. Denies fever. No trauma. He notes that he has a PCP and he hasn't mentioned his symptoms to his PCP. He states that he smoke cigarettes and consumes between 1-2 40 oz daily. Denies illegal drug use.    The history is provided by the patient. No language interpreter was used.    Past Medical History:  Diagnosis Date  . Hypertension     There are no active problems to display for this patient.   Past Surgical History:  Procedure Laterality Date  . FRACTURE SURGERY         Home Medications    Prior to Admission medications   Medication Sig Start Date End Date Taking? Authorizing Provider  amLODipine (NORVASC) 5 MG tablet Take 5 mg by mouth daily. 10/11/15   Historical Provider, MD  benazepril (LOTENSIN) 10 MG tablet Take 1 tablet (10 mg total) by mouth daily. Patient not taking: Reported on 11/19/2015 08/16/14    Evalee Jefferson, PA-C  doxycycline (VIBRAMYCIN) 100 MG capsule Take 1 capsule (100 mg total) by mouth 2 (two) times daily. 11/19/15   Pattricia Boss, MD  hydrochlorothiazide (HYDRODIURIL) 25 MG tablet Take 1 tablet (25 mg total) by mouth daily. 04/12/15   Clayton Bibles, PA-C  HYDROcodone-acetaminophen (NORCO/VICODIN) 5-325 MG per tablet Take 1 tablet by mouth every 4 (four) hours as needed for moderate pain. Patient not taking: Reported on 11/19/2015 08/16/14   Evalee Jefferson, PA-C  methocarbamol (ROBAXIN) 500 MG tablet Take 1 tablet (500 mg total) by mouth 2 (two) times daily as needed for muscle spasms (and pain). Patient not taking: Reported on 11/19/2015 04/12/15   Clayton Bibles, PA-C  methocarbamol (ROBAXIN) 500 MG tablet Take 1 tablet (500 mg total) by mouth 2 (two) times daily. Patient not taking: Reported on 11/19/2015 09/15/15   Shawn C Joy, PA-C  naproxen (NAPROSYN) 500 MG tablet Take 1 tablet (500 mg total) by mouth 2 (two) times daily. Patient not taking: Reported on 11/19/2015 09/15/15   Helane Gunther Joy, PA-C  oxyCODONE-acetaminophen (PERCOCET/ROXICET) 5-325 MG tablet Take 1 tablet by mouth every 6 (six) hours as needed for severe pain. Patient not taking: Reported on 11/19/2015 09/15/15   Shawn C Joy, PA-C  Phenyleph-CPM-DM-APAP (ALKA-SELTZER PLUS COLD & COUGH) 06-17-08-325 MG CAPS Take 2 capsules by mouth daily as needed (cold).    Historical Provider, MD  predniSONE (DELTASONE) 10 MG tablet Take  2 tablets (20 mg total) by mouth daily. 11/19/15   Pattricia Boss, MD    Family History Family History  Problem Relation Age of Onset  . Hypertension Mother   . Hypertension Father     Social History Social History  Substance Use Topics  . Smoking status: Current Every Day Smoker    Types: Cigarettes  . Smokeless tobacco: Never Used  . Alcohol use Yes     Comment: 2- 40 oz 5 times weekly - Drinks daily   No illicit drug use  Allergies   Patient has no known allergies.   Review of Systems Review of Systems    Constitutional: Negative.  Negative for appetite change.  HENT: Negative for facial swelling.        +lesion to left sided tongue. +Left sided facial pain  Respiratory: Positive for cough.   Cardiovascular: Negative.   Gastrointestinal: Negative.   Musculoskeletal: Positive for arthralgias (left hip). Negative for joint swelling.  Skin: Negative.   Neurological: Negative.   Psychiatric/Behavioral: Negative.   All other systems reviewed and are negative.    Physical Exam Updated Vital Signs BP (!) 156/102 (BP Location: Right Arm)   Pulse (!) 109   Temp 98 F (36.7 C) (Oral)   Resp 16   SpO2 100%   Physical Exam  Constitutional: He is oriented to person, place, and time. No distress.  HENT:  Head: Normocephalic and atraumatic.  Right Ear: External ear normal.  Left Ear: External ear normal.  Hyperpigmented area on the left border of tongue. No swelling. No thrush. Bilateral tympanic membranes normal  Eyes: Conjunctivae are normal. Pupils are equal, round, and reactive to light.  Neck: Neck supple. No tracheal deviation present. No thyromegaly present.  Cardiovascular: Normal rate, regular rhythm and normal heart sounds.  Exam reveals no gallop and no friction rub.   No murmur heard. Pulmonary/Chest: Effort normal and breath sounds normal. No respiratory distress. He has no wheezes. He has no rales.  Abdominal: Soft. Bowel sounds are normal. He exhibits no distension. There is no tenderness.  Musculoskeletal: Normal range of motion. He exhibits no edema or tenderness.  Neurological: He is alert and oriented to person, place, and time. Coordination normal.  Skin: Skin is warm and dry. No rash noted.  Psychiatric: He has a normal mood and affect. His behavior is normal.  Nursing note and vitals reviewed.    ED Treatments / Results  DIAGNOSTIC STUDIES: Oxygen Saturation is 100% on RA, nl by my interpretation.    COORDINATION OF CARE: 6:00 PM Discussed treatment plan with  pt at bedside which includes CXR and referral and follow up with ENT and pt agreed to plan.   Labs (all labs ordered are listed, but only abnormal results are displayed) Labs Reviewed  COMPREHENSIVE METABOLIC PANEL - Abnormal; Notable for the following:       Result Value   Sodium 129 (*)    Potassium 5.2 (*)    Chloride 93 (*)    CO2 19 (*)    Total Protein 8.4 (*)    AST 662 (*)    ALT 381 (*)    Alkaline Phosphatase 237 (*)    Total Bilirubin 1.7 (*)    Anion gap 17 (*)    All other components within normal limits  CBC WITH DIFFERENTIAL/PLATELET - Abnormal; Notable for the following:    WBC 3.9 (*)    RBC 3.78 (*)    HCT 35.9 (*)    MCH 34.9 (*)  MCHC 36.8 (*)    RDW 18.1 (*)    All other components within normal limits    EKG  EKG Interpretation None       Radiology Dg Chest 2 View  Result Date: 05/21/2016 CLINICAL DATA:  Pt has had a productive cough for 3 months w/ SOB. Hx of HTN EXAM: CHEST  2 VIEW COMPARISON:  None. FINDINGS: Cardiac silhouette is normal in size and configuration. No mediastinal or hilar masses. No evidence of adenopathy. There are small nodular opacities in both lower lungs on the frontal view only consistent with nipple shadows. Lungs are clear. No pleural effusion.  No pneumothorax. Skeletal structures are intact. IMPRESSION: No active cardiopulmonary disease. Electronically Signed   By: Lajean Manes M.D.   On: 05/21/2016 18:42    Procedures Procedures (including critical care time)  Medications Ordered in ED Medications - No data to display  Chest x-ray viewed by me Results for orders placed or performed during the hospital encounter of 05/21/16  Comprehensive metabolic panel  Result Value Ref Range   Sodium 129 (L) 135 - 145 mmol/L   Potassium 5.2 (H) 3.5 - 5.1 mmol/L   Chloride 93 (L) 101 - 111 mmol/L   CO2 19 (L) 22 - 32 mmol/L   Glucose, Bld 87 65 - 99 mg/dL   BUN 12 6 - 20 mg/dL   Creatinine, Ser 0.98 0.61 - 1.24 mg/dL    Calcium 9.5 8.9 - 10.3 mg/dL   Total Protein 8.4 (H) 6.5 - 8.1 g/dL   Albumin 4.1 3.5 - 5.0 g/dL   AST 662 (H) 15 - 41 U/L   ALT 381 (H) 17 - 63 U/L   Alkaline Phosphatase 237 (H) 38 - 126 U/L   Total Bilirubin 1.7 (H) 0.3 - 1.2 mg/dL   GFR calc non Af Amer >60 >60 mL/min   GFR calc Af Amer >60 >60 mL/min   Anion gap 17 (H) 5 - 15  CBC with Differential/Platelet  Result Value Ref Range   WBC 3.9 (L) 4.0 - 10.5 K/uL   RBC 3.78 (L) 4.22 - 5.81 MIL/uL   Hemoglobin 13.2 13.0 - 17.0 g/dL   HCT 35.9 (L) 39.0 - 52.0 %   MCV 95.0 78.0 - 100.0 fL   MCH 34.9 (H) 26.0 - 34.0 pg   MCHC 36.8 (H) 30.0 - 36.0 g/dL   RDW 18.1 (H) 11.5 - 15.5 %   Platelets 275 150 - 400 K/uL   Neutrophils Relative % 58 %   Neutro Abs 2.3 1.7 - 7.7 K/uL   Lymphocytes Relative 33 %   Lymphs Abs 1.3 0.7 - 4.0 K/uL   Monocytes Relative 6 %   Monocytes Absolute 0.3 0.1 - 1.0 K/uL   Eosinophils Relative 1 %   Eosinophils Absolute 0.0 0.0 - 0.7 K/uL   Basophils Relative 2 %   Basophils Absolute 0.1 0.0 - 0.1 K/uL   Dg Chest 2 View  Result Date: 05/21/2016 CLINICAL DATA:  Pt has had a productive cough for 3 months w/ SOB. Hx of HTN EXAM: CHEST  2 VIEW COMPARISON:  None. FINDINGS: Cardiac silhouette is normal in size and configuration. No mediastinal or hilar masses. No evidence of adenopathy. There are small nodular opacities in both lower lungs on the frontal view only consistent with nipple shadows. Lungs are clear. No pleural effusion.  No pneumothorax. Skeletal structures are intact. IMPRESSION: No active cardiopulmonary disease. Electronically Signed   By: Lajean Manes M.D.   On:  05/21/2016 18:42   Initial Impression / Assessment and Plan / ED Course  I have reviewed the triage vital signs and the nursing notes.  Pertinent labs & imaging results that were available during my care of the patient were reviewed by me and considered in my medical decision making (see chart for details).     Patient left the  department before I could be notified to re-evalaute him. He does seem of sound mind to refuse care Liver function tests are chronically elevated. I did tell him verbally but there is possibility of oral cancer and he should be evaluated by ear nose throat doctor. He does have a primary care physician Final Clinical Impressions(s) / ED Diagnoses  Diagnosis #1 tongue pain #2 chronically elevated liver function tests #3 chronic alcohol abuse #4 tobacco abuse #5 arthralgias Final diagnoses:  None    New Prescriptions New Prescriptions   No medications on file   I personally performed the services described in this documentation, which was scribed in my presence. The recorded information has been reviewed and considered.     Orlie Dakin, MD 05/21/16 2041

## 2016-05-21 NOTE — ED Notes (Signed)
Went in to round on patient and he stated he was going to leave. Encouraged him to stay and that test results were back. Pt got up and walked out of room, said he would come back tomorrow. MD notified.

## 2016-06-06 ENCOUNTER — Ambulatory Visit (HOSPITAL_COMMUNITY)
Admission: EM | Admit: 2016-06-06 | Discharge: 2016-06-06 | Disposition: A | Discharge status: Home / Self Care | Payer: Medicaid Other | Attending: Family Medicine | Admitting: Family Medicine

## 2016-06-06 ENCOUNTER — Encounter (HOSPITAL_COMMUNITY): Payer: Self-pay | Admitting: Family Medicine

## 2016-06-06 DIAGNOSIS — K121 Other forms of stomatitis: Secondary | ICD-10-CM

## 2016-06-06 MED ORDER — AMOXICILLIN 875 MG PO TABS: 875.0000 mg | ORAL_TABLET | Freq: Two times a day (BID) | ORAL | 0 refills | Status: DC

## 2016-06-06 MED ORDER — CHLORHEXIDINE GLUCONATE 0.12 % MT SOLN: 15.0000 mL | Freq: Two times a day (BID) | OROMUCOSAL | 0 refills | Status: DC

## 2016-06-06 NOTE — ED Provider Notes (Addendum)
[facility]    CSN: [id number] Arrival date & time: 06/06/16  1746     History   Chief Complaint Chief Complaint  Patient presents with  . Oral Swelling    HPI Lawrence Arias is a 56 y.o. male.   56 yo man who presents for evaluation of oral swelling.  He was "seen" in the ED two weeks ago, but the ED physician never made it to see the patient before he left the facility.        Past Medical History:  Diagnosis Date  . Hypertension     There are no active problems to display for this patient.   Past Surgical History:  Procedure Laterality Date  . FRACTURE SURGERY         Home Medications    Prior to Admission medications   Medication Sig Start Date End Date Taking? Authorizing Provider  amLODipine (NORVASC) 5 MG tablet Take 5 mg by mouth daily. 10/11/15  Yes Historical Provider, MD  benazepril (LOTENSIN) 10 MG tablet Take 1 tablet (10 mg total) by mouth daily. 08/16/14  Yes Evalee Jefferson, PA-C  hydrochlorothiazide (HYDRODIURIL) 25 MG tablet Take 1 tablet (25 mg total) by mouth daily. 04/12/15  Yes Clayton Bibles, PA-C  amoxicillin (AMOXIL) 875 MG tablet Take 1 tablet (875 mg total) by mouth 2 (two) times daily. 06/06/16   Robyn Haber, MD  chlorhexidine (PERIDEX) 0.12 % solution Use as directed 15 mLs in the mouth or throat 2 (two) times daily. 06/06/16   Robyn Haber, MD    Family History Family History  Problem Relation Age of Onset  . Hypertension Mother   . Hypertension Father     Social History Social History  Substance Use Topics  . Smoking status: Current Every Day Smoker    Types: Cigarettes  . Smokeless tobacco: Never Used  . Alcohol use Yes     Comment: 2- 40 oz 5 times weekly - Drinks daily     Allergies   Patient has no known allergies.   Review of Systems Review of Systems  HENT: Positive for mouth sores.   Respiratory: Positive for cough. Negative for chest tightness.   Cardiovascular: Negative.     Gastrointestinal: Negative.   Neurological: Negative.   All other systems reviewed and are negative.    Physical Exam Triage Vital Signs ED Triage Vitals  Enc Vitals Group     BP 06/06/16 1802 124/86     Pulse Rate 06/06/16 1802 91     Resp 06/06/16 1802 16     Temp 06/06/16 1802 98.8 F (37.1 C)     Temp Source 06/06/16 1802 Oral     SpO2 06/06/16 1802 95 %     Weight --      Height --      Head Circumference --      Peak Flow --      Pain Score 06/06/16 1809 9     Pain Loc --      Pain Edu? --      Excl. in [place]? --    No data found.   Updated Vital Signs BP 124/86 (BP Location: Left Arm)   Pulse 91   Temp 98.8 F (37.1 C) (Oral)   Resp 16   SpO2 95%    Physical Exam  Constitutional: He is oriented to person, place, and time. He appears well-developed and well-nourished.  HENT:  Right Ear: External ear normal.  Left Ear: External ear normal.  Ulcerated lesion adjacent to tooth #16  Eyes: Conjunctivae and EOM are normal. Pupils are equal, round, and reactive to light.  Neck: Normal range of motion. Neck supple.  Pulmonary/Chest: Effort normal.  Musculoskeletal: Normal range of motion.  Lymphadenopathy:    He has no cervical adenopathy.  Neurological: He is alert and oriented to person, place, and time.  Skin: Skin is warm and dry.  Nursing note and vitals reviewed.    UC Treatments / Results  Labs (all labs ordered are listed, but only abnormal results are displayed) Labs Reviewed - No data to display  EKG  EKG Interpretation None       Radiology No results found.  Procedures Procedures (including critical care time)  Medications Ordered in UC Medications - No data to display   Initial Impression / Assessment and Plan / UC Course  I have reviewed the triage vital signs and the nursing notes.  Pertinent labs & imaging results that were available during my care of the patient were reviewed by me and considered in my medical decision  making (see chart for details).     Final Clinical Impressions(s) / UC Diagnoses   Final diagnoses:  Oral ulceration    New Prescriptions New Prescriptions   AMOXICILLIN (AMOXIL) 875 MG TABLET    Take 1 tablet (875 mg total) by mouth 2 (two) times daily.   CHLORHEXIDINE (PERIDEX) 0.12 % SOLUTION    Use as directed 15 mLs in the mouth or throat 2 (two) times daily.     Robyn Haber, MD 06/06/16 0258    Robyn Haber, MD 06/06/16 [phone]

## 2016-06-06 NOTE — Discharge Instructions (Signed)
You need to be seen by your doctor for a specialist referral

## 2016-06-06 NOTE — ED Triage Notes (Signed)
Pt c/o pain on tongue onset 4 months associated w/white patch  Hurts to eat, drink  Seen at [facility] ED on 4/5 for similar sx  A&O x4... NAD

## 2016-07-21 ENCOUNTER — Other Ambulatory Visit: Payer: Self-pay | Admitting: Otolaryngology

## 2016-07-22 ENCOUNTER — Encounter (HOSPITAL_COMMUNITY): Payer: Self-pay | Admitting: *Deleted

## 2016-07-24 ENCOUNTER — Encounter (HOSPITAL_BASED_OUTPATIENT_CLINIC_OR_DEPARTMENT_OTHER)
Admission: RE | Disposition: A | Discharge status: Home / Self Care | Payer: Self-pay | Source: Ambulatory Visit | Attending: Otolaryngology

## 2016-07-24 ENCOUNTER — Encounter (HOSPITAL_BASED_OUTPATIENT_CLINIC_OR_DEPARTMENT_OTHER): Payer: Self-pay | Admitting: *Deleted

## 2016-07-24 ENCOUNTER — Ambulatory Visit (HOSPITAL_COMMUNITY)
Admission: RE | Admit: 2016-07-24 | Discharge: 2016-07-24 | Disposition: A | Discharge status: Home / Self Care | Payer: Medicaid Other | Source: Ambulatory Visit | Attending: Otolaryngology | Admitting: Otolaryngology

## 2016-07-24 ENCOUNTER — Ambulatory Visit (HOSPITAL_BASED_OUTPATIENT_CLINIC_OR_DEPARTMENT_OTHER): Payer: Medicaid Other | Admitting: Certified Registered"

## 2016-07-24 DIAGNOSIS — J449 Chronic obstructive pulmonary disease, unspecified: Secondary | ICD-10-CM | POA: Insufficient documentation

## 2016-07-24 DIAGNOSIS — K219 Gastro-esophageal reflux disease without esophagitis: Secondary | ICD-10-CM | POA: Insufficient documentation

## 2016-07-24 DIAGNOSIS — F17210 Nicotine dependence, cigarettes, uncomplicated: Secondary | ICD-10-CM | POA: Insufficient documentation

## 2016-07-24 DIAGNOSIS — I10 Essential (primary) hypertension: Secondary | ICD-10-CM | POA: Insufficient documentation

## 2016-07-24 DIAGNOSIS — C062 Malignant neoplasm of retromolar area: Secondary | ICD-10-CM | POA: Diagnosis not present

## 2016-07-24 DIAGNOSIS — Z79899 Other long term (current) drug therapy: Secondary | ICD-10-CM | POA: Insufficient documentation

## 2016-07-24 DIAGNOSIS — K149 Disease of tongue, unspecified: Secondary | ICD-10-CM | POA: Diagnosis present

## 2016-07-24 HISTORY — PX: DIRECT LARYNGOSCOPY: SHX5326

## 2016-07-24 HISTORY — PX: TONGUE BIOPSY: SHX6575

## 2016-07-24 HISTORY — DX: Other diseases of tongue: K14.8

## 2016-07-24 HISTORY — DX: Gastro-esophageal reflux disease without esophagitis: K21.9

## 2016-07-24 LAB — POCT HEMOGLOBIN-HEMACUE: Hemoglobin: 10.8 g/dL — ABNORMAL LOW (ref 13.0–17.0)

## 2016-07-24 SURGERY — LARYNGOSCOPY, DIRECT
Anesthesia: General | Site: Throat

## 2016-07-24 MED ORDER — SUCCINYLCHOLINE CHLORIDE 200 MG/10ML IV SOSY
PREFILLED_SYRINGE | INTRAVENOUS | Status: DC | PRN
  Administered 2016-07-24: 100 mg via INTRAVENOUS

## 2016-07-24 MED ORDER — METOCLOPRAMIDE HCL 5 MG/ML IJ SOLN: 10.0000 mg | Freq: Once | INTRAMUSCULAR | Status: DC | PRN

## 2016-07-24 MED ORDER — MIDAZOLAM HCL 2 MG/2ML IJ SOLN
1.0000 mg | INTRAMUSCULAR | Status: DC | PRN
  Administered 2016-07-24: 2 mg via INTRAVENOUS

## 2016-07-24 MED ORDER — LACTATED RINGERS IV SOLN: INTRAVENOUS | Status: DC

## 2016-07-24 MED ORDER — FENTANYL CITRATE (PF) 100 MCG/2ML IJ SOLN
INTRAMUSCULAR | Status: AC
  Filled 2016-07-24: qty 2

## 2016-07-24 MED ORDER — MEPERIDINE HCL 25 MG/ML IJ SOLN: 6.2500 mg | INTRAMUSCULAR | Status: DC | PRN

## 2016-07-24 MED ORDER — SUCCINYLCHOLINE CHLORIDE 200 MG/10ML IV SOSY
PREFILLED_SYRINGE | INTRAVENOUS | Status: AC
  Filled 2016-07-24: qty 10

## 2016-07-24 MED ORDER — LACTATED RINGERS IV SOLN
INTRAVENOUS | Status: DC
  Administered 2016-07-24: 09:00:00 via INTRAVENOUS

## 2016-07-24 MED ORDER — DEXAMETHASONE SODIUM PHOSPHATE 10 MG/ML IJ SOLN
INTRAMUSCULAR | Status: AC
  Filled 2016-07-24: qty 1

## 2016-07-24 MED ORDER — PROPOFOL 10 MG/ML IV BOLUS
INTRAVENOUS | Status: DC | PRN
  Administered 2016-07-24: 30 mg via INTRAVENOUS
  Administered 2016-07-24: 50 mg via INTRAVENOUS
  Administered 2016-07-24: 20 mg via INTRAVENOUS
  Administered 2016-07-24: 150 mg via INTRAVENOUS

## 2016-07-24 MED ORDER — ONDANSETRON HCL 4 MG/2ML IJ SOLN
INTRAMUSCULAR | Status: DC | PRN
  Administered 2016-07-24: 4 mg via INTRAVENOUS

## 2016-07-24 MED ORDER — FENTANYL CITRATE (PF) 100 MCG/2ML IJ SOLN
50.0000 ug | INTRAMUSCULAR | Status: DC | PRN
  Administered 2016-07-24: 100 ug via INTRAVENOUS

## 2016-07-24 MED ORDER — PROPOFOL 500 MG/50ML IV EMUL
INTRAVENOUS | Status: AC
  Filled 2016-07-24: qty 50

## 2016-07-24 MED ORDER — OXYMETAZOLINE HCL 0.05 % NA SOLN
NASAL | Status: DC | PRN
  Administered 2016-07-24: 1 via TOPICAL

## 2016-07-24 MED ORDER — FENTANYL CITRATE (PF) 100 MCG/2ML IJ SOLN: 25.0000 ug | INTRAMUSCULAR | Status: DC | PRN

## 2016-07-24 MED ORDER — DEXAMETHASONE SODIUM PHOSPHATE 4 MG/ML IJ SOLN
INTRAMUSCULAR | Status: DC | PRN
  Administered 2016-07-24: 10 mg via INTRAVENOUS

## 2016-07-24 MED ORDER — MIDAZOLAM HCL 2 MG/2ML IJ SOLN
INTRAMUSCULAR | Status: AC
  Filled 2016-07-24: qty 2

## 2016-07-24 MED ORDER — ONDANSETRON HCL 4 MG/2ML IJ SOLN
INTRAMUSCULAR | Status: AC
  Filled 2016-07-24: qty 2

## 2016-07-24 MED ORDER — LIDOCAINE 2% (20 MG/ML) 5 ML SYRINGE
INTRAMUSCULAR | Status: AC
  Filled 2016-07-24: qty 5

## 2016-07-24 MED ORDER — SCOPOLAMINE 1 MG/3DAYS TD PT72: 1.0000 | MEDICATED_PATCH | Freq: Once | TRANSDERMAL | Status: DC | PRN

## 2016-07-24 MED ORDER — LIDOCAINE 2% (20 MG/ML) 5 ML SYRINGE
INTRAMUSCULAR | Status: DC | PRN
  Administered 2016-07-24: 68 mg via INTRAVENOUS

## 2016-07-24 SURGICAL SUPPLY — 24 items
CANISTER SUCT 1200ML W/VALVE (MISCELLANEOUS) ×4 IMPLANT
CONT SPEC 4OZ CLIKSEAL STRL BL (MISCELLANEOUS) IMPLANT
GAUZE SPONGE 4X4 12PLY STRL LF (GAUZE/BANDAGES/DRESSINGS) ×8 IMPLANT
GLOVE ECLIPSE 6.5 STRL STRAW (GLOVE) ×4 IMPLANT
GLOVE SS BIOGEL STRL SZ 7.5 (GLOVE) ×2 IMPLANT
GLOVE SUPERSENSE BIOGEL SZ 7.5 (GLOVE) ×2
GOWN STRL REUS W/ TWL LRG LVL3 (GOWN DISPOSABLE) IMPLANT
GOWN STRL REUS W/ TWL XL LVL3 (GOWN DISPOSABLE) IMPLANT
GOWN STRL REUS W/TWL LRG LVL3 (GOWN DISPOSABLE) ×8
GOWN STRL REUS W/TWL XL LVL3 (GOWN DISPOSABLE)
GUARD TEETH (MISCELLANEOUS) ×2 IMPLANT
MARKER SKIN DUAL TIP RULER LAB (MISCELLANEOUS) IMPLANT
NDL HYPO 18GX1.5 BLUNT FILL (NEEDLE) ×2 IMPLANT
NDL SPNL 22GX7 QUINCKE BK (NEEDLE) IMPLANT
NEEDLE HYPO 18GX1.5 BLUNT FILL (NEEDLE) ×4 IMPLANT
NEEDLE SPNL 22GX7 QUINCKE BK (NEEDLE) IMPLANT
NS IRRIG 1000ML POUR BTL (IV SOLUTION) ×4 IMPLANT
PATTIES SURGICAL .5 X3 (DISPOSABLE) ×4 IMPLANT
SHEET MEDIUM DRAPE 40X70 STRL (DRAPES) ×4 IMPLANT
SYR 5ML LL (SYRINGE) ×4 IMPLANT
SYR CONTROL 10ML LL (SYRINGE) IMPLANT
TOWEL OR 17X24 6PK STRL BLUE (TOWEL DISPOSABLE) ×4 IMPLANT
TUBE CONNECTING 20'X1/4 (TUBING) ×1
TUBE CONNECTING 20X1/4 (TUBING) ×3 IMPLANT

## 2016-07-24 NOTE — Anesthesia Procedure Notes (Signed)
Procedure Name: Intubation Date/Time: 07/24/2016 10:10 AM Performed by: Lyndee Leo Pre-anesthesia Checklist: Patient identified, Emergency Drugs available, Suction available and Patient being monitored Patient Re-evaluated:Patient Re-evaluated prior to inductionOxygen Delivery Method: Circle system utilized Preoxygenation: Pre-oxygenation with 100% oxygen Intubation Type: IV induction Ventilation: Mask ventilation without difficulty Laryngoscope Size: Mac and 3 Grade View: Grade III Tube type: Oral Number of attempts: 1 Airway Equipment and Method: Stylet and Oral airway Placement Confirmation: ETT inserted through vocal cords under direct vision,  positive ETCO2 and breath sounds checked- equal and bilateral Secured at: 22 cm Tube secured with: Tape Dental Injury: Teeth and Oropharynx as per pre-operative assessment

## 2016-07-24 NOTE — Anesthesia Preprocedure Evaluation (Addendum)
Anesthesia Evaluation  Patient identified by MRN, date of birth, ID band Patient awake    Reviewed: Allergy & Precautions, NPO status , Patient's Chart, lab work & pertinent test results  Airway Mallampati: II  TM Distance: >3 FB Neck ROM: Full    Dental  (+) Missing, Poor Dentition,    Pulmonary COPD, Current Smoker,    Pulmonary exam normal breath sounds clear to auscultation       Cardiovascular hypertension, Pt. on medications Normal cardiovascular exam Rhythm:Regular Rate:Normal     Neuro/Psych negative neurological ROS  negative psych ROS   GI/Hepatic negative GI ROS, Neg liver ROS,   Endo/Other  negative endocrine ROS  Renal/GU negative Renal ROS  negative genitourinary   Musculoskeletal negative musculoskeletal ROS (+)   Abdominal   Peds negative pediatric ROS (+)  Hematology negative hematology ROS (+)   Anesthesia Other Findings   Reproductive/Obstetrics negative OB ROS                            Anesthesia Physical Anesthesia Plan  ASA: II  Anesthesia Plan: General   Post-op Pain Management:    Induction: Intravenous  PONV Risk Score and Plan: 1 and Ondansetron and Treatment may vary due to age  Airway Management Planned: Oral ETT  Additional Equipment:   Intra-op Plan:   Post-operative Plan: Extubation in OR  Informed Consent: I have reviewed the patients History and Physical, chart, labs and discussed the procedure including the risks, benefits and alternatives for the proposed anesthesia with the patient or authorized representative who has indicated his/her understanding and acceptance.   Dental advisory given  Plan Discussed with: CRNA  Anesthesia Plan Comments:         Anesthesia Quick Evaluation

## 2016-07-24 NOTE — Transfer of Care (Signed)
Immediate Anesthesia Transfer of Care Note  Patient: Lawrence Arias  Procedure(s) Performed: Procedure(s) with comments: DIRECT LARYNGOSCOPY (N/A) - Direct laryngoscopy with biopsy tongue lesion TONGUE BIOPSY  Patient Location: PACU  Anesthesia Type:General  Level of Consciousness: awake, drowsy and patient cooperative  Airway & Oxygen Therapy: Patient Spontanous Breathing and aerosol face mask  Post-op Assessment: Report given to RN and Post -op Vital signs reviewed and stable  Post vital signs: Reviewed and stable  Last Vitals:  Vitals:   07/24/16 0823  BP: (!) 130/94  Pulse: 95  Resp: 20  Temp: 36.5 C    Last Pain:  Vitals:   07/24/16 0823  TempSrc: Oral         Complications: No apparent anesthesia complications

## 2016-07-24 NOTE — Anesthesia Postprocedure Evaluation (Signed)
Anesthesia Post Note  Patient: Lawrence Arias  Procedure(s) Performed: Procedure(s) (LRB): DIRECT LARYNGOSCOPY (N/A) TONGUE BIOPSY     Patient location during evaluation: PACU Anesthesia Type: General Level of consciousness: awake and alert Pain management: pain level controlled Vital Signs Assessment: post-procedure vital signs reviewed and stable Respiratory status: spontaneous breathing, nonlabored ventilation, respiratory function stable and patient connected to nasal cannula oxygen Cardiovascular status: blood pressure returned to baseline and stable Postop Assessment: no signs of nausea or vomiting Anesthetic complications: no    Last Vitals:  Vitals:   07/24/16 1112 07/24/16 1130  BP: (!) 142/102 (!) 140/99  Pulse: 87 80  Resp: (!) 35 16  Temp:  36.4 C    Last Pain:  Vitals:   07/24/16 1130  TempSrc:   PainSc: 0-No pain                 Montez Hageman

## 2016-07-24 NOTE — Op Note (Signed)
Preop/postop diagnosis: Left retromolar trigone lesion Procedure: Direct laryngoscopy and biopsy of lesion Anesthesia: Gen. Estimated blood loss: Less than 5 mL Indications: 56 year old with a pain in the left side of his mouth and ear that has been present for many weeks. He has an obvious ulcerative lesion in the retromolar trigone region. He was informed a risk and benefits of the procedure and options were discussed all questions are answered and consent was obtained.  Operation: Patient taken operating placed in the supine position after general endotracheal tube anesthesia was placed in the rose position. The Dedo scope was inserted and the pharynx was a examined. There was no evidence of base of tongue. Form sinus post cricoid lesions. The glottis looked clear. The lateral pharyngeal walls look clear. The Crowe-Davis mouthgag was inserted retracted and suspended from the [place] stand. The left ulcerative area on the left retromolar trigone was fairly evident and biopsied multiple times with cup forceps. Hemostasis achieved with adrenaline soaked pledget and cautery. There was good hemostasis. The Crowe-Davis was removed the patient was awake and brought to recovery in stable condition counts correct

## 2016-07-24 NOTE — H&P (Signed)
Lawrence Arias is an 56 y.o. male.   Chief Complaint: sore throat HPI: hx of lesion in left mouth.   Past Medical History:  Diagnosis Date  . GERD (gastroesophageal reflux disease)    uses OTC prn  . Hypertension   . Tongue lesion     Past Surgical History:  Procedure Laterality Date  . FRACTURE SURGERY      Family History  Problem Relation Age of Onset  . Hypertension Mother   . Hypertension Father    Social History:  reports that he has been smoking Cigarettes.  He has been smoking about 0.50 packs per day. He has never used smokeless tobacco. He reports that he drinks alcohol. He reports that he does not use drugs.  Allergies: No Known Allergies  Medications Prior to Admission  Medication Sig Dispense Refill  . amLODipine (NORVASC) 5 MG tablet Take 5 mg by mouth daily.  0  . chlorhexidine (PERIDEX) 0.12 % solution Use as directed 15 mLs in the mouth or throat 2 (two) times daily. 120 mL 0    Results for orders placed or performed during the hospital encounter of 07/24/16 (from the past 48 hour(s))  Hemoglobin-hemacue, POC     Status: Abnormal   Collection Time: 07/24/16  8:39 AM  Result Value Ref Range   Hemoglobin 10.8 (L) 13.0 - 17.0 g/dL   No results found.  Review of Systems  Constitutional: Negative.   HENT: Negative.   Eyes: Negative.   Respiratory: Negative.   Cardiovascular: Negative.   Skin: Negative.     Blood pressure (!) 130/94, pulse 95, temperature 97.7 F (36.5 C), temperature source Oral, resp. rate 20, height 5' 7.5" (1.715 m), weight 58.5 kg (129 lb), SpO2 100 %. Physical Exam  Constitutional: He appears well-developed and well-nourished.  HENT:  Head: Normocephalic and atraumatic.  Nose: Nose normal.  Eyes: Conjunctivae are normal. Pupils are equal, round, and reactive to light.  Neck: Normal range of motion. Neck supple.  Cardiovascular: Normal rate.   Respiratory: Effort normal.  GI: Soft.  Musculoskeletal: Normal range of  motion.     Assessment/Plan Left retromolar trigone lesion- discussed procedure and ready to proceed  Lawrence Montane, MD 07/24/2016, 9:48 AM

## 2016-07-24 NOTE — Discharge Instructions (Signed)

## 2016-07-25 ENCOUNTER — Encounter (HOSPITAL_BASED_OUTPATIENT_CLINIC_OR_DEPARTMENT_OTHER): Payer: Self-pay | Admitting: Otolaryngology

## 2016-08-04 ENCOUNTER — Other Ambulatory Visit (HOSPITAL_COMMUNITY): Payer: Self-pay | Admitting: Otolaryngology

## 2016-08-04 ENCOUNTER — Encounter: Payer: Self-pay | Admitting: Radiation Oncology

## 2016-08-04 DIAGNOSIS — C44329 Squamous cell carcinoma of skin of other parts of face: Secondary | ICD-10-CM

## 2016-08-10 ENCOUNTER — Telehealth: Payer: Self-pay | Admitting: *Deleted

## 2016-08-10 NOTE — Telephone Encounter (Signed)
Oncology Nurse Navigator Documentation  Placed introductory call to new referral patient Mr. Mottern.  Unable to reach him at listed number, spoke with his [place].  Introduced myself as the H&N oncology nurse navigator that works with Dr. Isidore Moos to whom he has been referred by Dr. Janace Hoard.  She confirmed his understanding of referral and appt date/time of 08/18/1028/1100.  I informed her of 6/29 2:00 CT Neck at [facility].  Of note:  She stated he has his own teeth.  I explained he will be contacted to schedule a pre-radiotherapy dental evaluation.  She provided contact information for his dtr.  I briefly explained my role as his navigator, indicated that I would be joining them during his appt next week.  I confirmed her understanding of [place] location, explained arrival and RadOnc registration process for appt.  I provided my contact information, encouraged her to call with questions/concerns before next week. She verbalized understanding of information provided, expressed appreciation for my call.  [name], RN, BSN, [place] [facility]ck Oncology Nurse [place] at [place] [phone]

## 2016-08-12 NOTE — Progress Notes (Signed)
error 

## 2016-08-13 ENCOUNTER — Telehealth: Payer: Self-pay | Admitting: *Deleted

## 2016-08-13 NOTE — Telephone Encounter (Signed)
Oncology Nurse Navigator Documentation  Reached Lawrence Arias at his daughter's home where he is staying.  Introduced myself as the H&N oncology nurse navigator that works with Dr. Isidore Moos to whom he has been referred by Dr. Janace Hoard.  He confirmed his understanding of referral and appt date/time of 7/3 10:30/11:00 to meet with Dr. Isidore Moos.  Confirmed his understanding of 12:45 appt with Dental Medicine following Dr. Isidore Moos.  Confirmed his understanding of 1:45 arrival to [facility] for 2:00 CT Neck, NPO status except for liquids 4 hrs prior.  Later confirmed same with Ms. Alinda Money, his SO, who will be transporting him.  I briefly explained my role as his navigator, indicated that I would be joining him during his appt next week.  I confirmed his understanding of [place] location, explained arrival and RadOnc registration process for appt.  I provided my contact information, encouraged him to call with questions/concerns before next week. He verbalized understanding of information provided, expressed appreciation for my call.  [name], RN, BSN, [place] Neck Oncology Nurse [place] at [place] [phone]

## 2016-08-14 ENCOUNTER — Ambulatory Visit (HOSPITAL_COMMUNITY): Payer: Medicaid Other

## 2016-08-14 ENCOUNTER — Ambulatory Visit (HOSPITAL_COMMUNITY)
Admission: RE | Admit: 2016-08-14 | Discharge: 2016-08-14 | Disposition: A | Discharge status: Home / Self Care | Payer: Medicaid Other | Source: Ambulatory Visit | Attending: Otolaryngology | Admitting: Otolaryngology

## 2016-08-14 DIAGNOSIS — C44329 Squamous cell carcinoma of skin of other parts of face: Secondary | ICD-10-CM

## 2016-08-14 DIAGNOSIS — C4492 Squamous cell carcinoma of skin, unspecified: Secondary | ICD-10-CM | POA: Insufficient documentation

## 2016-08-14 DIAGNOSIS — R599 Enlarged lymph nodes, unspecified: Secondary | ICD-10-CM | POA: Diagnosis not present

## 2016-08-14 MED ORDER — IOPAMIDOL (ISOVUE-300) INJECTION 61%
INTRAVENOUS | Status: AC
  Filled 2016-08-14: qty 75

## 2016-08-14 MED ORDER — IOPAMIDOL (ISOVUE-370) INJECTION 76%
75.0000 mL | Freq: Once | INTRAVENOUS | Status: AC | PRN
  Administered 2016-08-14: 75 mL via INTRAVENOUS

## 2016-08-15 ENCOUNTER — Emergency Department (HOSPITAL_COMMUNITY)
Admission: EM | Admit: 2016-08-15 | Discharge: 2016-08-15 | Disposition: A | Discharge status: Home / Self Care | Payer: Medicaid Other | Attending: Emergency Medicine | Admitting: Emergency Medicine

## 2016-08-15 ENCOUNTER — Encounter (HOSPITAL_COMMUNITY): Payer: Self-pay | Admitting: Emergency Medicine

## 2016-08-15 DIAGNOSIS — K1370 Unspecified lesions of oral mucosa: Secondary | ICD-10-CM

## 2016-08-15 DIAGNOSIS — Z79899 Other long term (current) drug therapy: Secondary | ICD-10-CM | POA: Insufficient documentation

## 2016-08-15 DIAGNOSIS — F17210 Nicotine dependence, cigarettes, uncomplicated: Secondary | ICD-10-CM | POA: Diagnosis not present

## 2016-08-15 DIAGNOSIS — I10 Essential (primary) hypertension: Secondary | ICD-10-CM | POA: Insufficient documentation

## 2016-08-15 DIAGNOSIS — K0889 Other specified disorders of teeth and supporting structures: Secondary | ICD-10-CM | POA: Diagnosis present

## 2016-08-15 MED ORDER — OXYCODONE-ACETAMINOPHEN 5-325 MG PO TABS
1.0000 | ORAL_TABLET | Freq: Once | ORAL | Status: AC
  Administered 2016-08-15: 1 via ORAL
  Filled 2016-08-15: qty 1

## 2016-08-15 MED ORDER — LIDOCAINE VISCOUS 2 % MT SOLN: 20.0000 mL | OROMUCOSAL | 0 refills | Status: DC | PRN

## 2016-08-15 MED ORDER — OXYCODONE-ACETAMINOPHEN 5-325 MG PO TABS: 1.0000 | ORAL_TABLET | Freq: Four times a day (QID) | ORAL | 0 refills | Status: DC | PRN

## 2016-08-15 NOTE — ED Provider Notes (Signed)
[facility] DEPT Provider Note   CSN: [id number] Arrival date & time: 08/15/16  [place]     History   Chief Complaint Chief Complaint  Patient presents with  . Dental Pain    HPI Lawrence Arias is a 56 y.o. male.  Patient with recently diagnosed squamous cell oral cancer, status post radiation therapy 1 -- presents complaining of oral pain. Patient has an open wound in the left pharynx. He states that he is unable able to eat soup. Today the pain was worse and radiates to his ear. No facial swelling, fever. Patient is able to swallow without difficulty other than pain. Patient requesting medication for pain control. He has mouthwash at home, no other treatments. No prescriptions in substance reporting database. The onset of this condition was incidious. The course is constant. Aggravating factors: eating. Alleviating factors: none.        Past Medical History:  Diagnosis Date  . GERD (gastroesophageal reflux disease)    uses OTC prn  . Hypertension   . Tongue lesion     There are no active problems to display for this patient.   Past Surgical History:  Procedure Laterality Date  . DIRECT LARYNGOSCOPY N/A 07/24/2016   Procedure: DIRECT LARYNGOSCOPY;  Surgeon: Melissa Montane, MD;  Location: [place];  Service: ENT;  Laterality: N/A;  Direct laryngoscopy with biopsy tongue lesion  . FRACTURE SURGERY    . TONGUE BIOPSY  07/24/2016   Procedure: TONGUE BIOPSY;  Surgeon: Melissa Montane, MD;  Location: [place];  Service: ENT;;       Home Medications    Prior to Admission medications   Medication Sig Start Date End Date Taking? Authorizing Provider  acetaminophen (TYLENOL) 500 MG tablet Take 1,000 mg by mouth every 6 (six) hours as needed for moderate pain.   Yes [provider]  amLODipine (NORVASC) 5 MG tablet Take 5 mg by mouth daily. 10/11/15  Yes [provider]  lidocaine (XYLOCAINE) 2 % solution Use as directed 20 mLs  in the mouth or throat as needed for mouth pain. 08/15/16   Carlisle Cater, PA-C  oxyCODONE-acetaminophen (PERCOCET/ROXICET) 5-325 MG tablet Take 1-2 tablets by mouth every 6 (six) hours as needed for severe pain. 08/15/16   Carlisle Cater, PA-C    Family History Family History  Problem Relation Age of Onset  . Hypertension Mother   . Hypertension Father     Social History Social History  Substance Use Topics  . Smoking status: Current Every Day Smoker    Packs/day: 0.50    Types: Cigarettes  . Smokeless tobacco: Never Used  . Alcohol use Yes     Comment: 2- 40 oz 5 times weekly - Drinks daily     Allergies   Patient has no known allergies.   Review of Systems Review of Systems  Constitutional: Negative for fever.  HENT: Positive for ear pain. Negative for dental problem, facial swelling, sore throat, trouble swallowing and voice change.        Positive for oral lesion and pain.  Respiratory: Negative for shortness of breath and stridor.   Musculoskeletal: Negative for neck pain.  Skin: Negative for color change.  Neurological: Negative for headaches.     Physical Exam Updated Vital Signs BP (!) 135/104 (BP Location: Right Arm)   Pulse 85   Temp 98 F (36.7 C) (Oral)   Resp 18   Ht 5\' 7"  (1.702 m)   Wt 59 kg (130 lb)  SpO2 96%   BMI 20.36 kg/m   Physical Exam  Constitutional: He appears well-developed and well-nourished.  HENT:  Head: Normocephalic and atraumatic.  Mouth/Throat: Oropharynx is clear and moist.  Patient with open 2 cm lesion to the side of the left cheek. No active bleeding. No signs of infection.  Eyes: Conjunctivae are normal. Right eye exhibits no discharge. Left eye exhibits no discharge.  Neck: Normal range of motion. Neck supple.  Cardiovascular: Normal rate, regular rhythm and normal heart sounds.   Pulmonary/Chest: Effort normal and breath sounds normal.  Abdominal: Soft. There is no tenderness.  Neurological: He is alert.  Skin:  Skin is warm and dry.  Psychiatric: He has a normal mood and affect.  Nursing note and vitals reviewed.    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 EXAM: CT NECK WITH CONTRAST TECHNIQUE: Multidetector CT imaging of the neck was performed using the standard protocol following the bolus administration of intravenous contrast. CONTRAST:  75 mL Isovue 370 COMPARISON:  None. FINDINGS: Pharynx and larynx: --Nasopharynx: Fossae of Rosenmuller are clear. Normal adenoid tonsils for age. --Oral cavity and oropharynx: No visible lesion of the tongue or floor of mouth. Normal lingual and palatine tonsils. Oropharynx is clear. The the ulcerative lesion of the retromolar trigone identified on direct examination is not visible on this study. --Hypopharynx: Normal vallecula and pyriform sinuses. --Larynx: Normal epiglottis and pre-epiglottic space. Normal aryepiglottic and vocal folds. --Retropharyngeal space: No abscess, effusion or lymphadenopathy. Salivary glands: The parotid, sublingual and submandibular glands are normal. No sialolithiasis or salivary ductal dilatation. Thyroid: Normal Lymph nodes: Left level IIa node measures 10 mm. No right-sided lymphadenopathy. No other enlarged left cervical nodes. Vascular: Normal Limited intracranial: Normal Visualized orbits: Normal Mastoids and visualized paranasal sinuses: Clear Skeleton: No acute or aggressive process. Upper chest: Negative. Other: None. IMPRESSION: 1. Nonvisualization of left retromolar trigone mucosal lesion. No evidence of invasion into the adjacent structures. 2. Left level IIa lymph node measuring 1 cm, likely indicating spread of disease. No contralateral adenopathy. Electronically Signed   By: Ulyses Jarred M.D.   On: 08/14/2016 15:09    Procedures Procedures (including critical care time)  Medications Ordered in  ED Medications  oxyCODONE-acetaminophen (PERCOCET/ROXICET) 5-325 MG per tablet 1 tablet (1 tablet Oral Given 08/15/16 2144)     Initial Impression / Assessment and Plan / ED Course  I have reviewed the triage vital signs and the nursing notes.  Pertinent labs & imaging results that were available during my care of the patient were reviewed by me and considered in my medical decision making (see chart for details).     Patient seen and examined.   Vital signs reviewed and are as follows: BP 107/83 (BP Location: Right Arm)   Pulse 77   Temp 98 F (36.7 C) (Oral)   Resp 16   Ht 5\' 7"  (1.702 m)   Wt 59 kg (130 lb)   SpO2 100%   BMI 20.36 kg/m   Patient counseled on use of narcotic pain medications. Counseled not to combine these medications with others containing tylenol. Urged not to drink alcohol, drive, or perform any other activities that requires focus while taking these medications. The patient verbalizes understanding and agrees with the plan.  Encouraged that he discuss pain management with his physician at next visit.  Final Clinical Impressions(s) / ED Diagnoses  Final diagnoses:  Oral lesion   Patient with pain related to oral cancer. No signs of infection. Patient without trismus, signs of airway obstruction. No fever or systemic symptoms of illness. Will treat symptoms.  New Prescriptions New Prescriptions   LIDOCAINE (XYLOCAINE) 2 % SOLUTION    Use as directed 20 mLs in the mouth or throat as needed for mouth pain.   OXYCODONE-ACETAMINOPHEN (PERCOCET/ROXICET) 5-325 MG TABLET    Take 1-2 tablets by mouth every 6 (six) hours as needed for severe pain.     Carlisle Cater, PA-C 08/15/16 2229    Duffy Bruce, MD 08/18/16 [phone]

## 2016-08-15 NOTE — Discharge Instructions (Signed)
Please read and follow all provided instructions.  Your diagnoses today include:  1. Oral lesion     The exam and treatment you received today has been provided on an emergency basis only. This is not a substitute for complete medical or dental care.  Tests performed today include:  Vital signs. See below for your results today.   Medications prescribed:   Percocet (oxycodone/acetaminophen) - narcotic pain medication  DO NOT drive or perform any activities that require you to be awake and alert because this medicine can make you drowsy. BE VERY CAREFUL not to take multiple medicines containing Tylenol (also called acetaminophen). Doing so can lead to an overdose which can damage your liver and cause liver failure and possibly death.  Take any prescribed medications only as directed.  Home care instructions:  Follow any educational materials contained in this packet.  Follow-up instructions: Please follow-up with your doctor for further evaluation of your symptoms.   Return instructions:   Please return to the Emergency Department if you experience worsening symptoms.  Please return if you develop a fever, you develop more swelling in your face or neck, you have trouble breathing or swallowing food.  Please return if you have any other emergent concerns.  Additional Information:  Your vital signs today were: BP (!) 135/104 (BP Location: Right Arm)    Pulse 85    Temp 98 F (36.7 C) (Oral)    Resp 18    Ht 5\' 7"  (1.702 m)    Wt 59 kg (130 lb)    SpO2 96%    BMI 20.36 kg/m  If your blood pressure (BP) was elevated above 135/85 this visit, please have this repeated by your doctor within one month. --------------

## 2016-08-15 NOTE — ED Triage Notes (Signed)
Pt reports he has oral cancer and is having pain on his tongue and throat. Went to oncologist yesterday, and was told to come to the ED for pain management. Went to radiation yesterday. Not on chemo yet.

## 2016-08-18 ENCOUNTER — Ambulatory Visit (HOSPITAL_COMMUNITY): Payer: Medicaid Other | Admitting: Dentistry

## 2016-08-18 ENCOUNTER — Ambulatory Visit
Admission: RE | Admit: 2016-08-18 | Discharge: 2016-08-18 | Disposition: A | Discharge status: Home / Self Care | Payer: Medicaid Other | Source: Ambulatory Visit | Attending: Radiation Oncology | Admitting: Radiation Oncology

## 2016-08-18 ENCOUNTER — Encounter (HOSPITAL_COMMUNITY): Payer: Self-pay | Admitting: Dentistry

## 2016-08-18 ENCOUNTER — Other Ambulatory Visit: Payer: Self-pay | Admitting: *Deleted

## 2016-08-18 DIAGNOSIS — C062 Malignant neoplasm of retromolar area: Secondary | ICD-10-CM | POA: Insufficient documentation

## 2016-08-18 DIAGNOSIS — M542 Cervicalgia: Secondary | ICD-10-CM | POA: Insufficient documentation

## 2016-08-18 DIAGNOSIS — Z6820 Body mass index (BMI) 20.0-20.9, adult: Secondary | ICD-10-CM | POA: Insufficient documentation

## 2016-08-18 DIAGNOSIS — R634 Abnormal weight loss: Secondary | ICD-10-CM | POA: Insufficient documentation

## 2016-08-18 DIAGNOSIS — K219 Gastro-esophageal reflux disease without esophagitis: Secondary | ICD-10-CM | POA: Insufficient documentation

## 2016-08-18 DIAGNOSIS — I10 Essential (primary) hypertension: Secondary | ICD-10-CM | POA: Insufficient documentation

## 2016-08-18 DIAGNOSIS — Z8249 Family history of ischemic heart disease and other diseases of the circulatory system: Secondary | ICD-10-CM | POA: Insufficient documentation

## 2016-08-18 DIAGNOSIS — F17210 Nicotine dependence, cigarettes, uncomplicated: Secondary | ICD-10-CM | POA: Insufficient documentation

## 2016-08-18 DIAGNOSIS — Z79899 Other long term (current) drug therapy: Secondary | ICD-10-CM | POA: Insufficient documentation

## 2016-08-18 NOTE — Progress Notes (Signed)
error 

## 2016-08-20 NOTE — Progress Notes (Signed)
Patient no show. No call.  Dr. Enrique Sack

## 2016-08-21 ENCOUNTER — Emergency Department (HOSPITAL_COMMUNITY)
Admission: EM | Admit: 2016-08-21 | Discharge: 2016-08-21 | Disposition: A | Discharge status: Home / Self Care | Payer: Medicaid Other | Attending: Emergency Medicine | Admitting: Emergency Medicine

## 2016-08-21 ENCOUNTER — Encounter (HOSPITAL_COMMUNITY): Payer: Self-pay | Admitting: Emergency Medicine

## 2016-08-21 DIAGNOSIS — G893 Neoplasm related pain (acute) (chronic): Secondary | ICD-10-CM | POA: Insufficient documentation

## 2016-08-21 DIAGNOSIS — Z79899 Other long term (current) drug therapy: Secondary | ICD-10-CM | POA: Insufficient documentation

## 2016-08-21 DIAGNOSIS — I10 Essential (primary) hypertension: Secondary | ICD-10-CM | POA: Insufficient documentation

## 2016-08-21 DIAGNOSIS — C062 Malignant neoplasm of retromolar area: Secondary | ICD-10-CM | POA: Diagnosis not present

## 2016-08-21 DIAGNOSIS — K146 Glossodynia: Secondary | ICD-10-CM | POA: Diagnosis present

## 2016-08-21 DIAGNOSIS — F17210 Nicotine dependence, cigarettes, uncomplicated: Secondary | ICD-10-CM | POA: Diagnosis not present

## 2016-08-21 HISTORY — DX: Malignant (primary) neoplasm, unspecified: C80.1

## 2016-08-21 MED ORDER — OXYCODONE-ACETAMINOPHEN 5-325 MG PO TABS: 1.0000 | ORAL_TABLET | Freq: Four times a day (QID) | ORAL | 0 refills | Status: DC | PRN

## 2016-08-21 MED ORDER — OXYCODONE-ACETAMINOPHEN 5-325 MG PO TABS
1.0000 | ORAL_TABLET | Freq: Once | ORAL | Status: AC
  Administered 2016-08-21: 1 via ORAL
  Filled 2016-08-21: qty 1

## 2016-08-21 NOTE — ED Provider Notes (Signed)
[facility] DEPT Provider Note   By signing my name below, I, Bea Graff, attest that this documentation has been prepared under the direction and in the presence of Tahsin Benyo, PA-C. Electronically Signed: Bea Graff, ED Scribe. 08/21/16. 6:48 PM.   History   Chief Complaint Chief Complaint  Patient presents with  . tongue pain   HPI  Lawrence Arias is a 56 y.o. male with PMHx of tongue cancer who presents to the Emergency Department complaining of burning tongue pain that has been ongoing for the past 1.5 months. He reports intermittent pain of the left side of his neck. He states he has an appt with his doctor three days ago but missed it. He has taken Tylenol and Ibuprofen for pain with no significant relief. Eating increases the pain. He denies alleviating factors. He denies fever, chills, nausea, vomiting. He states he is scheduled to start radiation treatment in four days.   Past Medical History:  Diagnosis Date  . Cancer ([place])   . GERD (gastroesophageal reflux disease)    uses OTC prn  . Hypertension   . Tongue lesion     Patient Active Problem List   Diagnosis Date Noted  . Squamous cell cancer of retromolar trigone ([place]) 08/18/2016    Past Surgical History:  Procedure Laterality Date  . DIRECT LARYNGOSCOPY N/A 07/24/2016   Procedure: DIRECT LARYNGOSCOPY;  Surgeon: Melissa Montane, MD;  Location: [place];  Service: ENT;  Laterality: N/A;  Direct laryngoscopy with biopsy tongue lesion  . FRACTURE SURGERY    . TONGUE BIOPSY  07/24/2016   Procedure: TONGUE BIOPSY;  Surgeon: Melissa Montane, MD;  Location: [place];  Service: ENT;;       Home Medications    Prior to Admission medications   Medication Sig Start Date End Date Taking? Authorizing Provider  acetaminophen (TYLENOL) 500 MG tablet Take 1,000 mg by mouth every 6 (six) hours as needed for moderate pain.    [provider]  amLODipine (NORVASC) 5 MG  tablet Take 5 mg by mouth daily. 10/11/15   [provider]  lidocaine (XYLOCAINE) 2 % solution Use as directed 20 mLs in the mouth or throat as needed for mouth pain. 08/15/16   Carlisle Cater, PA-C  oxyCODONE-acetaminophen (PERCOCET/ROXICET) 5-325 MG tablet Take 1-2 tablets by mouth every 6 (six) hours as needed for severe pain. 08/21/16   Jatasia Gundrum, PA-C    Family History Family History  Problem Relation Age of Onset  . Hypertension Mother   . Hypertension Father     Social History Social History  Substance Use Topics  . Smoking status: Current Every Day Smoker    Packs/day: 0.50    Types: Cigarettes  . Smokeless tobacco: Never Used  . Alcohol use Yes     Comment: 2- 40 oz 5 times weekly - Drinks daily     Allergies   Patient has no known allergies.   Review of Systems Review of Systems  Constitutional: Negative for chills and fever.  HENT: Positive for mouth sores (tongue pain).   Gastrointestinal: Negative for nausea and vomiting.     Physical Exam Updated Vital Signs BP (!) 140/105 (BP Location: Left Arm) Comment: has not been taking BP meds  Pulse 88   Temp 98.7 F (37.1 C) (Oral)   Resp 18   Wt 130 lb 7 oz (59.2 kg)   SpO2 100%   BMI 20.43 kg/m   Physical Exam  Constitutional: He is oriented  to person, place, and time. He appears well-developed and well-nourished.  HENT:  Head: Normocephalic and atraumatic.  Open lesion to left cheek without bleeding. No surrounding erythema or swelling indicative of infection. No other lesions noted in the mouth. No TTP of the mandible or neck.  Neck: Normal range of motion.  Cardiovascular: Normal rate, regular rhythm and normal heart sounds.   Pulmonary/Chest: Effort normal and breath sounds normal. No respiratory distress.  Musculoskeletal: Normal range of motion.  Neurological: He is alert and oriented to person, place, and time.  Skin: Skin is warm and dry.  Psychiatric: He has a normal mood and  affect. His behavior is normal.  Nursing note and vitals reviewed.    ED Treatments / Results  DIAGNOSTIC STUDIES: Oxygen Saturation is 100% on RA, normal by my interpretation.   COORDINATION OF CARE: 6:28 PM- Will speak with attending about appropriate course of treatment. Pt verbalizes understanding and agrees to plan.  6:44 PM- Will prescribe pain medication per Dr. Zenia Resides.   Medications  oxyCODONE-acetaminophen (PERCOCET/ROXICET) 5-325 MG per tablet 1 tablet (1 tablet Oral Given 08/21/16 1904)    Labs (all labs ordered are listed, but only abnormal results are displayed) Labs Reviewed - No data to display  EKG  EKG Interpretation None       Radiology No results found.  Procedures Procedures (including critical care time)  Medications Ordered in ED Medications  oxyCODONE-acetaminophen (PERCOCET/ROXICET) 5-325 MG per tablet 1 tablet (1 tablet Oral Given 08/21/16 1904)     Initial Impression / Assessment and Plan / ED Course  I have reviewed the triage vital signs and the nursing notes.  Pertinent labs & imaging results that were available during my care of the patient were reviewed by me and considered in my medical decision making (see chart for details).     Patient with a history of oral cancer presenting today for pain. Patient missed his appointment on the third, and states he has not been able to get in touch with his oncologist to get pain medicine. Per chart review, patient was seen here last week for the same, and prescribed short dose of narcotic pain medicine. Discussed case with attending. Will give patient 1 dose of Percocet here, and small prescription for opioids until he can follow-up with his oncologist on Tuesday. Discussed with patient that the emergency room is not the place to be getting pain medicine, and this will be the last time he can get pain medicine filled here. Stressed importance of follow-up with his oncologist. Patient appears safe for  discharge. Return precautions given. Patient states he understands plan.   Final Clinical Impressions(s) / ED Diagnoses   Final diagnoses:  Squamous cell cancer of retromolar trigone [facility])    New Prescriptions Discharge Medication List as of 08/21/2016  7:06 PM     I personally performed the services described in this documentation, which was scribed in my presence. The recorded information has been reviewed and is accurate.     Franchot Heidelberg, PA-C 08/21/16 1916    Lacretia Leigh, MD 08/25/16 [phone]

## 2016-08-21 NOTE — Discharge Instructions (Signed)
Take pain medicine as needed. It is very important that you follow-up with your oncologist and keep your appointments. Return to the emergency department for any new or worsening symptoms.

## 2016-08-21 NOTE — Progress Notes (Signed)
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  Patient presented months ago with symptoms of: He reports pain to the left side of his mouth and ear which has been present for six months.   Biopsies of mouth revealed: Invasive Squamous cell carcinoma  Nutrition Status Yes No Comments  Weight changes? [x]  []  15- 20 lbs weight loss in one month  Swallowing concerns? []  [x]  He denies problems swallowing. He reports pain prevents him from eating.   PEG? []  [x]     Referrals Yes No Comments  Social Work? []  [x]    Dentistry? []  [x]  Dr. Enrique Sack 08/18/16 no show, apt for 08/25/16 after Dr. Isidore Moos.   Swallowing therapy? []  [x]    Nutrition? []  [x]    Med/Onc? []  [x]     Safety Issues Yes No Comments  Prior radiation? []  [x]    Pacemaker/ICD? []  [x]    Possible current pregnancy? []  [x]    Is the patient on methotrexate? []  [x]     Tobacco/Marijuana/Snuff/ETOH use: He is a current everyday smoker of about 1/2 pack daily. He drinks two 40 ounces containers of alcohol 5 or more days a week.   Past/Anticipated interventions by otolaryngology, if any:  Dr. 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   Past/Anticipated interventions by medical oncology, if any:  N/A    Current Complaints / other details:    BP (!) 131/98   Pulse 94   Temp 97.9 F (36.6 C)   Ht 5\' 7"  (1.702 m)   Wt 127 lb 3.2 oz (57.7 kg)   SpO2 99% Comment: room air  BMI 19.92 kg/m    Wt Readings from Last 3 Encounters:  08/25/16 127 lb 3.2 oz (57.7 kg)  08/21/16 130 lb 7 oz (59.2 kg)  08/15/16 130 lb (59 kg)

## 2016-08-21 NOTE — ED Triage Notes (Signed)
Per pt, states he has oral/tongue cancer-states diagnosed over a month ago-states has an appointment at [facility] next week-states increased pain

## 2016-08-24 ENCOUNTER — Telehealth: Payer: Self-pay | Admitting: *Deleted

## 2016-08-24 NOTE — Telephone Encounter (Signed)
Oncology Nurse Navigator Documentation  Called Lawrence Arias to remind him of tomorrow's appts (0830 NE, 0900 Dr. Isidore Moos, 10:00 Dental Medicine).  Unable to reach him but spoke with his transporter, Ms. Alinda Money, and his dtr with whom he lives, informed them of appts, emphasized importance of keeping them b/c they were previously cancelled.  They both voiced understanding.  [name], RN, BSN, [place] Neck Oncology Nurse [place] at [place] [phone]

## 2016-08-25 ENCOUNTER — Encounter (HOSPITAL_COMMUNITY): Payer: Self-pay | Admitting: Dentistry

## 2016-08-25 ENCOUNTER — Encounter: Payer: Self-pay | Admitting: Radiation Oncology

## 2016-08-25 ENCOUNTER — Encounter: Payer: Self-pay | Admitting: *Deleted

## 2016-08-25 ENCOUNTER — Ambulatory Visit
Admission: RE | Admit: 2016-08-25 | Discharge: 2016-08-25 | Disposition: A | Discharge status: Home / Self Care | Payer: Medicaid Other | Source: Ambulatory Visit | Attending: Radiation Oncology | Admitting: Radiation Oncology

## 2016-08-25 ENCOUNTER — Ambulatory Visit (HOSPITAL_COMMUNITY): Payer: Self-pay | Admitting: Dentistry

## 2016-08-25 ENCOUNTER — Telehealth: Payer: Self-pay | Admitting: *Deleted

## 2016-08-25 VITALS — BP 128/85 | HR 63 | Temp 97.8°F

## 2016-08-25 DIAGNOSIS — F17210 Nicotine dependence, cigarettes, uncomplicated: Secondary | ICD-10-CM | POA: Diagnosis not present

## 2016-08-25 DIAGNOSIS — C062 Malignant neoplasm of retromolar area: Secondary | ICD-10-CM | POA: Diagnosis not present

## 2016-08-25 DIAGNOSIS — K031 Abrasion of teeth: Secondary | ICD-10-CM

## 2016-08-25 DIAGNOSIS — C069 Malignant neoplasm of mouth, unspecified: Secondary | ICD-10-CM

## 2016-08-25 DIAGNOSIS — K029 Dental caries, unspecified: Secondary | ICD-10-CM

## 2016-08-25 DIAGNOSIS — K06020 Generalized gingival recession, unspecified: Secondary | ICD-10-CM

## 2016-08-25 DIAGNOSIS — M542 Cervicalgia: Secondary | ICD-10-CM | POA: Diagnosis not present

## 2016-08-25 DIAGNOSIS — K08409 Partial loss of teeth, unspecified cause, unspecified class: Secondary | ICD-10-CM

## 2016-08-25 DIAGNOSIS — Z01818 Encounter for other preprocedural examination: Secondary | ICD-10-CM

## 2016-08-25 DIAGNOSIS — M264 Malocclusion, unspecified: Secondary | ICD-10-CM

## 2016-08-25 DIAGNOSIS — K219 Gastro-esophageal reflux disease without esophagitis: Secondary | ICD-10-CM | POA: Diagnosis not present

## 2016-08-25 DIAGNOSIS — Z8249 Family history of ischemic heart disease and other diseases of the circulatory system: Secondary | ICD-10-CM | POA: Diagnosis not present

## 2016-08-25 DIAGNOSIS — K036 Deposits [accretions] on teeth: Secondary | ICD-10-CM

## 2016-08-25 DIAGNOSIS — R634 Abnormal weight loss: Secondary | ICD-10-CM | POA: Diagnosis not present

## 2016-08-25 DIAGNOSIS — Z79899 Other long term (current) drug therapy: Secondary | ICD-10-CM | POA: Diagnosis not present

## 2016-08-25 DIAGNOSIS — R252 Cramp and spasm: Secondary | ICD-10-CM

## 2016-08-25 DIAGNOSIS — K0889 Other specified disorders of teeth and supporting structures: Secondary | ICD-10-CM

## 2016-08-25 DIAGNOSIS — Z6820 Body mass index (BMI) 20.0-20.9, adult: Secondary | ICD-10-CM | POA: Diagnosis not present

## 2016-08-25 DIAGNOSIS — K0530 Chronic periodontitis, unspecified: Secondary | ICD-10-CM

## 2016-08-25 DIAGNOSIS — I10 Essential (primary) hypertension: Secondary | ICD-10-CM | POA: Diagnosis not present

## 2016-08-25 MED ORDER — BUPROPION HCL ER (SR) 150 MG PO TB12: ORAL_TABLET | ORAL | 2 refills | Status: DC

## 2016-08-25 MED ORDER — NICOTINE 21 MG/24HR TD PT24: 21.0000 mg | MEDICATED_PATCH | Freq: Every day | TRANSDERMAL | 2 refills | Status: DC

## 2016-08-25 MED ORDER — NICOTINE 7 MG/24HR TD PT24
7.0000 mg | MEDICATED_PATCH | Freq: Every day | TRANSDERMAL | 0 refills | Status: DC
Start: 2016-08-25 — End: 2017-06-14

## 2016-08-25 MED ORDER — NICOTINE 14 MG/24HR TD PT24: 14.0000 mg | MEDICATED_PATCH | Freq: Every day | TRANSDERMAL | 0 refills | Status: DC

## 2016-08-25 MED ORDER — NICOTINE 7 MG/24HR TD PT24: 7.0000 mg | MEDICATED_PATCH | Freq: Every day | TRANSDERMAL | 0 refills | Status: DC

## 2016-08-25 MED ORDER — GABAPENTIN 300 MG PO CAPS: 300.0000 mg | ORAL_CAPSULE | Freq: Three times a day (TID) | ORAL | 0 refills | Status: DC

## 2016-08-25 MED FILL — BUPROPION SR 150 MG TABLET: 150 | 30 days supply | Qty: 60 | Fill #0

## 2016-08-25 MED FILL — GABAPENTIN 300 MG CAPSULE: 300 | 30 days supply | Qty: 90 | Fill #0

## 2016-08-25 NOTE — Patient Instructions (Signed)

## 2016-08-25 NOTE — Telephone Encounter (Signed)
CALLED PATIENT TO INFORM OF NUTRITION APPT. FOR 08-27-16 @ 11:15 AM WITH BARBARA NEFF, SPOKE WITH PATIENT AND HE IS AWARE OF THIS APPT.
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
## 2016-08-25 NOTE — Progress Notes (Signed)
Radiation Oncology         [phone] ________________________________  Initial outpatient Consultation  Name: Lawrence Arias MRN: [id number]  Date: 08/25/2016  DOB: [birth year]  WE:[facility], Provider Not In  Melissa Montane, MD   REFERRING PHYSICIAN: Melissa Montane, MD  DIAGNOSIS:    ICD-10-CM   1. 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 gabapentin (NEURONTIN) 300 MG capsule    Amb Referral to Nutrition and Diabetic E    Ambulatory referral to Social Work    buPROPion (WELLBUTRIN SR) 150 MG 12 hr tablet    nicotine (NICODERM CQ - DOSED IN MG/24 HOURS) 21 mg/24hr patch    nicotine (NICODERM CQ - DOSED IN MG/24 HOURS) 14 mg/24hr patch    nicotine (NICODERM CQ - DOSED IN MG/24 HR) 7 mg/24hr patch    Ambulatory referral to ENT    DISCONTINUED: buPROPion (WELLBUTRIN SR) 150 MG 12 hr tablet    DISCONTINUED: nicotine (NICODERM CQ - DOSED IN MG/24 HOURS) 21 mg/24hr patch    DISCONTINUED: nicotine (NICODERM CQ - DOSED IN MG/24 HOURS) 14 mg/24hr patch    DISCONTINUED: nicotine (NICODERM CQ - DOSED IN MG/24 HR) 7 mg/24hr patch  2. 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 male who presented with pain to the left side of his mouth and ear, ongoing for 6 months.  The patient was referred to Dr. Janace Hoard, and underwent biopsy of the left retromolar on 07/24/16. This revealed invasive squamous cell carcinoma.  Pertinent imaging thus far includes CT neck with contrast performed on 08/14/16, revealing non visualization of the left retromolar trigone mucosal lesion. No evidence of invasion into the adjacent structures. Left level IIa lymph node measuring 1 cm, likely indicating  spread of disease. No contralateral adenopathy. Additionally, he had a chest x-ray, 2 views, on 05/21/16. His lungs were clear on this exam. I have personally reviewed his imaging.  On 08/21/16 the patient presented to the ED with complaint of "burning tongue pain." He was given percocet in the ED and discharged with small prescription for opioid pain relief. The patient presented to the ED previously on 08/15/16 with similar complaint and discharge instructions.  The patient presents to the clinic today to discuss the role that radiation therapy may play in the treatment of his disease. He is accompanied by his friend today.  On review of systems, the patient reports left sided mouth and ear pain, which prevents him from eating. He reports a 15-20 lbs weight loss in the last month. He denies dificulty swallowing, other than some associated pain. He reports difficulty opening his jaw. He reports mild left neck pain. He denies chest or abdominal pain, or shortness of breath. He denies bladder or bowel concerns. He denies swelling of his extremities or skin rashes. The patient requests pain medication today; he reports lidocaine and percocet has been ineffective.  The patient is scheduled to consult with Dr. Enrique Sack today following this encounter.  PREVIOUS RADIATION THERAPY: No  PAST MEDICAL HISTORY:  has a past medical history of Cancer ([place]); GERD (gastroesophageal reflux disease); Hypertension; and Tongue lesion.    PAST SURGICAL HISTORY: Past Surgical History:  Procedure Laterality Date  .  DIRECT LARYNGOSCOPY N/A 07/24/2016   Procedure: DIRECT LARYNGOSCOPY;  Surgeon: Melissa Montane, MD;  Location: [facility];  Service: ENT;  Laterality: N/A;  Direct laryngoscopy with biopsy tongue lesion  . FRACTURE SURGERY     left wrist, remote  . TONGUE BIOPSY  07/24/2016   Procedure: TONGUE BIOPSY;  Surgeon: Melissa Montane, MD;  Location: [facility];  Service: ENT;;    FAMILY HISTORY:  family history includes Hypertension in his father and mother.  SOCIAL HISTORY:  reports that he has been smoking Cigarettes.  He has been smoking about 0.50 packs per day. He has never used smokeless tobacco. He reports that he drinks alcohol. He reports that he does not use drugs. He is currently living with his daughter in [place]. The patient is not working. He enjoys sports. He was smoking 1 ppd, and now smokes 1/2 ppd. He plans to quit smoking. He drinks 10 40 oz malt liquor drinks per week, and plans to cut down on this.  ALLERGIES: Patient has no known allergies.  MEDICATIONS:  Current Outpatient Prescriptions  Medication Sig Dispense Refill  . acetaminophen (TYLENOL) 500 MG tablet Take 1,000 mg by mouth every 6 (six) hours as needed for moderate pain.    Marland Kitchen amLODipine (NORVASC) 5 MG tablet Take 5 mg by mouth daily.  0  . lidocaine (XYLOCAINE) 2 % solution Use as directed 20 mLs in the mouth or throat as needed for mouth pain. 100 mL 0  . oxyCODONE-acetaminophen (PERCOCET/ROXICET) 5-325 MG tablet Take 1-2 tablets by mouth every 6 (six) hours as needed for severe pain. 10 tablet 0  . buPROPion (WELLBUTRIN SR) 150 MG 12 hr tablet Start one week before quit date. Take 1 tab daily x 3 days, then 1 tab BID thereafter. 60 tablet 2  . gabapentin (NEURONTIN) 300 MG capsule Take 1 capsule (300 mg total) by mouth 3 (three) times daily. To address cancer related pain. 90 capsule 0  . nicotine (NICODERM CQ - DOSED IN MG/24 HOURS) 14 mg/24hr patch Place 1 patch (14 mg total) onto the skin daily. Apply 21 mg patch daily x 6 wk, then 14mg  patch daily x 2 wk, then 7 mg patch daily x 2 wk 14 patch 0  . nicotine (NICODERM CQ - DOSED IN MG/24 HOURS) 21 mg/24hr patch Place 1 patch (21 mg total) onto the skin daily. Apply 21 mg patch daily x 6 wk, then 14mg  patch daily x 2 wk, then 7 mg patch daily x 2 wk 14 patch 2  . nicotine (NICODERM CQ - DOSED IN MG/24 HR) 7 mg/24hr patch Place 1 patch (7 mg total) onto  the skin daily. Apply 21 mg patch daily x 6 wk, then 14mg  patch daily x 2 wk, then 7 mg patch daily x 2 wk 14 patch 0   No current facility-administered medications for this encounter.     REVIEW OF SYSTEMS: A 10+ POINT REVIEW OF SYSTEMS WAS OBTAINED including neurology, dermatology, psychiatry, cardiac, respiratory, lymph, extremities, GI, GU, musculoskeletal, constitutional,  HEENT. All pertinent positives are noted in the HPI. All others are negative.   PHYSICAL EXAM:  height is 5\' 7"  (1.702 m) and weight is 127 lb 3.2 oz (57.7 kg). His temperature is 97.9 F (36.6 C). His blood pressure is 131/98 (abnormal) and his pulse is 94. His oxygen saturation is 99%.   General: Alert and oriented, in no acute distress. HEENT: Head is normocephalic. He has an erosive mass extending form the  left lower retromolar trigone into the lateral left soft palate; it appears to be a little over 2 cm in greatest dimension. He has significant trismus. No other lesions appreciated in the mouth. Neck: Not able to appreciate any lymphadenopathy in the cervical or supraclavicular regions. Heart: Regular in rate and rhythm with no murmurs. Chest: Clear to auscultation bilaterally. Abdomen: Soft, non tender, with normoactive bowel sounds. Extremities: No edema in the wrists or ankles bilaterally. Lymphatics: see Neck Exam Skin: No concerning lesions. Musculoskeletal: symmetric strength and muscle tone throughout. Neurologic: No obvious focalities. Speech is fluent. Coordination is intact. Psychiatric: Judgment and insight are intact. Affect is appropriate.  ECOG = 1   LABORATORY DATA:  Lab Results  Component Value Date   WBC 3.9 (L) 05/21/2016   HGB 10.8 (L) 07/24/2016   HCT 35.9 (L) 05/21/2016   MCV 95.0 05/21/2016   PLT 275 05/21/2016   CMP     Component Value Date/Time   NA 129 (L) 05/21/2016 1811   K 5.2 (H) 05/21/2016 1811   CL 93 (L) 05/21/2016 1811   CO2 19 (L) 05/21/2016 1811   GLUCOSE 87  05/21/2016 1811   BUN 12 05/21/2016 1811   CREATININE 0.98 05/21/2016 1811   CALCIUM 9.5 05/21/2016 1811   PROT 8.4 (H) 05/21/2016 1811   ALBUMIN 4.1 05/21/2016 1811   AST 662 (H) 05/21/2016 1811   ALT 381 (H) 05/21/2016 1811   ALKPHOS 237 (H) 05/21/2016 1811   BILITOT 1.7 (H) 05/21/2016 1811   GFRNONAA >60 05/21/2016 1811   GFRAA >60 05/21/2016 1811    RADIOGRAPHY: Ct Soft Tissue Neck W Contrast  Result Date: 08/14/2016 CLINICAL DATA:  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 EXAM: CT NECK WITH CONTRAST TECHNIQUE: Multidetector CT imaging of the neck was performed using the standard protocol following the bolus administration of intravenous contrast. CONTRAST:  75 mL Isovue 370 COMPARISON:  None. FINDINGS: Pharynx and larynx: --Nasopharynx: Fossae of Rosenmuller are clear. Normal adenoid tonsils for age. --Oral cavity and oropharynx: No visible lesion of the tongue or floor of mouth. Normal lingual and palatine tonsils. Oropharynx is clear. The the ulcerative lesion of the retromolar trigone identified on direct examination is not visible on this study. --Hypopharynx: Normal vallecula and pyriform sinuses. --Larynx: Normal epiglottis and pre-epiglottic space. Normal aryepiglottic and vocal folds. --Retropharyngeal space: No abscess, effusion or lymphadenopathy. Salivary glands: The parotid, sublingual and submandibular glands are normal. No sialolithiasis or salivary ductal dilatation. Thyroid: Normal Lymph nodes: Left level IIa node measures 10 mm. No right-sided lymphadenopathy. No other enlarged left cervical nodes. Vascular: Normal Limited intracranial: Normal Visualized orbits: Normal Mastoids and visualized paranasal sinuses: Clear Skeleton: No acute or aggressive process. Upper chest: Negative. Other: None. IMPRESSION: 1. Nonvisualization of left retromolar trigone mucosal lesion. No evidence of invasion into the adjacent structures. 2. Left level IIa lymph node measuring 1 cm,  likely indicating spread of disease. No contralateral adenopathy. Electronically Signed   By: Ulyses Jarred M.D.   On: 08/14/2016 15:09      IMPRESSION/PLAN: oral cavity cancer. I suspect patient has at least T3N1 disease; based on trismus, this could be more. CT chest is pending, PET was denied by insurance.  This is a delightful patient with head and neck cancer. I would recommend surgical resection and likely adjuvant radiotherapy for this patient.  We discussed the potential risks, benefits, and side effects of radiotherapy. We talked in detail about acute and late effects. We discussed that some of  the most bothersome acute effects may be mucositis, dysgeusia, salivary changes, skin irritation, hair loss, dehydration, weight loss and fatigue. We talked about late effects which include but are not necessarily limited to dysphagia, hypothyroidism, nerve injury, spinal cord injury, xerostomia, trismus, and neck edema. No guarantees of treatment were given. The patient is enthusiastic about proceeding with treatment. I look forward to participating in the patient's care.    Simulation (treatment planning) will take place in the future when indicated.  We also discussed that the treatment of head and neck cancer is a multidisciplinary process to maximize treatment outcomes and quality of life. For this reason  the following referrals have been or will be made:  1) Dentistry for dental evaluation, possible extractions in the radiation fields, and /or advice on reducing risk of cavities, osteoradionecrosis, or other oral issues. The patient will meet with Dr. Enrique Sack this morning following this encounter.  2) Nutritionist for nutrition support during and after treatment.  3) Social work for social support.   4) Surgical oncology for discussion of tumor resection prior to radiotherapy. I spoke with Dr Janace Hoard about this.  Patient ultimately prefers surgery "in [place] because I hear good things  about there."  Dr. Janace Hoard and I support his wishes; I made a referral today. I will rely on surgical clips to target his tumor bed.   I asked the patient today about tobacco use. The patient uses tobacco.  I advised the patient to quit. Services were offered by me today including outpatient counseling and pharmacotherapy. I assessed for the willingness to attempt to quit and provided encouragement and demonstrated willingness to make referrals and/or prescriptions to help the patient attempt to quit. The patient has follow-up with the oncologic team to touch base on their tobacco use and /or cessation efforts.  Over 3 minutes were spent on this issue.  The patient is interested in quitting, and has set a quit date of 09/30/16. He would like to try nicotine patches. I discussed a nicotine patch taper with the patient today and instructed him on this taper. We also discussed the possibility of starting Wellbutrin. The patient is in agreement. He prefers the [employer]. I will prescribe both nicotine patches and Wellbutrin today.   The patient will also continue to cut down on his alcohol usage, with the goal to stop alcohol use completely in a month. I strongly encouraged the patient that tobacco and alcohol cessation will significantly improve his prognosis. He voices understanding and agreement.  Additionally, I will prescribe Neurontin for the patient's pain today since percocet has not been effective for him. The patient is in agreement. I provided the patient with written instructions for all prescriptions given today.  I strongly encouraged the patient to try nutritional supplements to increase his nutrition intake. He may drink up to 8 of these daily to meet his nutritional needs if he is unable to take any other nutrition orally. The patient is in agreement.   __________________________________________   Eppie Gibson, MD  This document serves as a record of services personally  performed by Eppie Gibson, MD. It was created on her behalf by Maryla Morrow, a trained medical scribe. The creation of this record is based on the scribe's personal observations and the provider's statements to them. This document has been checked and approved by the attending provider.

## 2016-08-26 NOTE — Progress Notes (Signed)
Oncology Nurse Navigator Documentation  Met with patient during initial consult with Dr. Isidore Moos.  He was accompanied by friend Ms. Alinda Money.  1. Further introduced myself as his Navigator, explained my role as a member of the Care Team.   2. Provided New Patient Information packet, discussed contents:  Contact information for physician(s), myself, other members of the Care Team.  Advance Directive information ([place] blue pamphlet with LCSW contact info)  Fall Prevention Patient [place] sheet  [place] [facility] map with highlight of [place] 3. Provided introductory explanation of radiation treatment including SIM planning and purpose of Aquaplast head and shoulder mask, showed them example.   4. I escorted them to Dental Medicine following consult with Dr. Isidore Moos. 5. I encouraged them to contact me with questions/concerns as treatments/procedures begin.  They verbalized understanding of information provided.    [name], RN, BSN, [place] Neck Oncology Nurse [place] at [place] [phone]

## 2016-08-27 ENCOUNTER — Encounter: Payer: Self-pay | Admitting: Nutrition

## 2016-08-27 ENCOUNTER — Telehealth: Payer: Self-pay | Admitting: *Deleted

## 2016-08-27 NOTE — Telephone Encounter (Signed)
Oncology Nurse Navigator Documentation  Sent email to [facility] navigators Tamika Laurance Flatten and Rip Harbour with Assessment and Plan/Recommendations from Dr. Ritta Slot 7/10 pre-radiotherapy evaluation of Mr. Schutter as preface to his [facility] consultation per  Dr. Pearlie Oyster 7/10 referral.  [name], RN, BSN, [place] at [place] [phone]

## 2016-08-28 ENCOUNTER — Telehealth: Payer: Self-pay | Admitting: *Deleted

## 2016-08-28 NOTE — Telephone Encounter (Signed)
CALLED PATIENT TO INFORM OF CT ON 09-01-16- ARRIVAL TIME - 11:45 AM , PT. TO BE NPO- 4 HRS. PRIOR TO TEST, TEST TO BE @ [facility], AND HIS APPT. WITH DR. DALE BROWNE ON 09-15-16- ARRIVAL TIME - 10:45 AM @ [place], [place]. [place], SPOKE WITH PATIENT AND HE IS AWARE OF THESE APPTS.

## 2016-08-28 NOTE — Telephone Encounter (Signed)
Oncology Nurse Navigator Documentation  In follow-up to message rec'd from RN [facility], called Mr. Morocco. He inquired if ENT Dr. Janace Hoard was able to conduct surgery recommended for his tmt.  I noted he was referred to [facility] because of extensive nature of surgery.  He asked if I would check with Dr. Peggyann Juba nonetheless.  I noted I would do so on Monday, get back to him.  [name], RN, BSN, [place] Neck Oncology Nurse [place] at [place] [phone]

## 2016-08-31 ENCOUNTER — Telehealth: Payer: Self-pay | Admitting: *Deleted

## 2016-08-31 NOTE — Telephone Encounter (Signed)
CALLED PATIENT TO INFORM THAT CT HAS BEEN CANCELLED FOR 09-01-16 DUE TO THE INSURANCE NOT PAYING FOR IT, SPOKE WITH PATIENT AND HE IS AWARE OF THIS

## 2016-09-01 ENCOUNTER — Ambulatory Visit (HOSPITAL_COMMUNITY): Admission: RE | Admit: 2016-09-01 | Payer: Medicaid Other | Source: Ambulatory Visit

## 2016-09-07 ENCOUNTER — Telehealth: Payer: Self-pay | Admitting: *Deleted

## 2016-09-07 NOTE — Telephone Encounter (Signed)
Oncology Nurse Navigator Documentation  Called Mr. Harewood to check on his well-being. He stated he has an appt at [facility] to discuss surgery, voiced understanding the expertise available at [facility] is most appropriate for his needs. He understands I will follow his progress at [facility], he can call me with needs/concerns.  [name], RN, BSN, [place] Neck Oncology Nurse [place] at [place] [phone]

## 2016-09-16 MED FILL — NICOTINE 21 MG/24HR PATCH: 21 | 14 days supply | Qty: 14 | Fill #0

## 2016-09-22 ENCOUNTER — Telehealth: Payer: Self-pay | Admitting: *Deleted

## 2016-09-22 NOTE — Telephone Encounter (Signed)
Oncology Nurse Navigator Documentation  Unable to reach patient at listed phone numbers to check on his well-being and treatment plan at [facility]  Spoke with Emergency Contact who indicated patient has 8/14 appt at [facility].  Asked her to have him call me.  She voiced agreement.  [name], RN, BSN, [place] Neck Oncology Nurse [place] at [place] [phone]

## 2016-10-11 ENCOUNTER — Emergency Department (HOSPITAL_COMMUNITY)
Admission: EM | Admit: 2016-10-11 | Discharge: 2016-10-11 | Disposition: A | Discharge status: Home / Self Care | Payer: Medicaid Other | Attending: Emergency Medicine | Admitting: Emergency Medicine

## 2016-10-11 ENCOUNTER — Encounter (HOSPITAL_COMMUNITY): Payer: Self-pay | Admitting: Emergency Medicine

## 2016-10-11 DIAGNOSIS — F17210 Nicotine dependence, cigarettes, uncomplicated: Secondary | ICD-10-CM | POA: Diagnosis not present

## 2016-10-11 DIAGNOSIS — I10 Essential (primary) hypertension: Secondary | ICD-10-CM | POA: Insufficient documentation

## 2016-10-11 DIAGNOSIS — C062 Malignant neoplasm of retromolar area: Secondary | ICD-10-CM | POA: Diagnosis not present

## 2016-10-11 DIAGNOSIS — R6884 Jaw pain: Secondary | ICD-10-CM | POA: Diagnosis present

## 2016-10-11 MED ORDER — OXYCODONE-ACETAMINOPHEN 5-325 MG PO TABS: 1.0000 | ORAL_TABLET | Freq: Four times a day (QID) | ORAL | 0 refills | Status: DC | PRN

## 2016-10-11 MED ORDER — OXYCODONE-ACETAMINOPHEN 5-325 MG PO TABS
2.0000 | ORAL_TABLET | Freq: Once | ORAL | Status: AC
Start: 2016-10-11 — End: 2016-10-11
  Administered 2016-10-11: 2 via ORAL
  Filled 2016-10-11: qty 2

## 2016-10-11 NOTE — ED Triage Notes (Signed)
Reports having pain in left side of mouth/jaw due to mouth cancer.  Taking tylenol for pain with no relief.

## 2016-10-11 NOTE — ED Provider Notes (Signed)
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 This is consistent with his cancer pain. He has been followed by ENT and dental at [facility]. Reports he is scheduled for surgery on August 31. He states he called yesterday because he needs pain medication and did not receive a call back. Came to the emergency department because he is unable to his pain controlled. No fevers, chills, nausea, vomiting or diarrhea. No chest pain or shortness of breath. Reports he is not currently on chemotherapy.  ROS: See HPI Constitutional: no fever  Eyes: no drainage  ENT: no runny nose   Cardiovascular:  no chest pain  Resp: no SOB  GI: no vomiting GU: no dysuria Integumentary: no rash  Allergy: no hives  Musculoskeletal: no leg swelling  Neurological: no slurred speech ROS otherwise negative  PAST MEDICAL HISTORY/PAST SURGICAL HISTORY:  Past Medical History:  Diagnosis Date  . Cancer ([place])   . GERD (gastroesophageal reflux disease)    uses OTC prn  . Hypertension   . Tongue lesion     MEDICATIONS:  Prior to Admission medications   Medication Sig Start Date End Date Taking? Authorizing Provider  acetaminophen (TYLENOL) 500 MG tablet Take 1,000 mg by mouth every 6 (six) hours as needed for moderate pain.    [provider]  amLODipine (NORVASC) 5 MG tablet Take 5 mg by mouth daily. 10/11/15   [provider]  buPROPion (WELLBUTRIN SR) 150 MG 12 hr tablet Start one week before quit date. Take 1 tab daily x 3 days, then 1 tab BID thereafter. 08/25/16   Eppie Gibson, MD  gabapentin (NEURONTIN) 300 MG capsule Take 1 capsule (300 mg total) by mouth 3 (three) times daily. To address cancer related pain. 08/25/16   Eppie Gibson, MD  lidocaine (XYLOCAINE) 2 % solution Use as directed 20 mLs in the mouth or  throat as needed for mouth pain. 08/15/16   Carlisle Cater, PA-C  nicotine (NICODERM CQ - DOSED IN MG/24 HOURS) 14 mg/24hr patch Place 1 patch (14 mg total) onto the skin daily. Apply 21 mg patch daily x 6 wk, then 14mg  patch daily x 2 wk, then 7 mg patch daily x 2 wk 08/25/16   Eppie Gibson, MD  nicotine (NICODERM CQ - DOSED IN MG/24 HOURS) 21 mg/24hr patch Place 1 patch (21 mg total) onto the skin daily. Apply 21 mg patch daily x 6 wk, then 14mg  patch daily x 2 wk, then 7 mg patch daily x 2 wk 08/25/16   Eppie Gibson, MD  nicotine (NICODERM CQ - DOSED IN MG/24 HR) 7 mg/24hr patch Place 1 patch (7 mg total) onto the skin daily. Apply 21 mg patch daily x 6 wk, then 14mg  patch daily x 2 wk, then 7 mg patch daily x 2 wk 08/25/16   Eppie Gibson, MD  oxyCODONE-acetaminophen (PERCOCET/ROXICET) 5-325 MG tablet Take 1-2 tablets by mouth every 6 (six) hours as needed for severe pain. 08/21/16   Caccavale, Sophia, PA-C    ALLERGIES:  No Known Allergies  SOCIAL HISTORY:  Social History  Substance Use Topics  . Smoking status: Current Every Day Smoker    Packs/day: 0.50    Types: Cigarettes  . Smokeless tobacco: Never Used  . Alcohol use Yes     Comment: 2- 40 oz 5 times weekly -  Drinks daily    FAMILY HISTORY: Family History  Problem Relation Age of Onset  . Hypertension Mother   . Hypertension Father     EXAM: BP (!) 126/98 (BP Location: Right Arm)   Pulse 84   Temp 97.6 F (36.4 C) (Oral)   Resp 18   Ht 5\' 7"  (1.702 m)   Wt 59.6 kg (131 lb 8 oz)   SpO2 100%   BMI 20.60 kg/m  CONSTITUTIONAL: Alert and oriented and responds appropriately to questions. Well-appearing; well-nourished HEAD: Normocephalic EYES: Conjunctivae clear, pupils appear equal, EOMI ENT: normal nose; moist mucous membranes; No pharyngeal erythema or petechiae, no tonsillar hypertrophy or exudate, no uvular deviation, no unilateral swelling, no trismus or drooling, no muffled voice, normal phonation, no stridor,  multiple dental caries present, no drainable dental abscess noted, no Ludwig's angina, tongue sits flat in the bottom of the mouth, no angioedema, no facial erythema or warmth, no facial swelling; no pain with movement of the neck. Patient has a lesion noted in the posterior left lower molar area consistent with his squamous cell carcinoma without any bleeding or signs of superimposed infection. NECK: Supple, no meningismus, no nuchal rigidity, no LAD  CARD: RRR; S1 and S2 appreciated; no murmurs, no clicks, no rubs, no gallops RESP: Normal chest excursion without splinting or tachypnea; breath sounds clear and equal bilaterally; no wheezes, no rhonchi, no rales, no hypoxia or respiratory distress, speaking full sentences ABD/GI: Normal bowel sounds; non-distended; soft, non-tender, no rebound, no guarding, no peritoneal signs, no hepatosplenomegaly BACK:  The back appears normal and is non-tender to palpation, there is no CVA tenderness EXT: Normal ROM in all joints; non-tender to palpation; no edema; normal capillary refill; no cyanosis, no calf tenderness or swelling    SKIN: Normal color for age and race; warm; no rash NEURO: Moves all extremities equally PSYCH: The patient's mood and manner are appropriate. Grooming and personal hygiene are appropriate.  MEDICAL DECISION MAKING: Patient here with history of squamous cell carcinoma he was being followed at [facility] who presents emergency department with complaints of pain in his mouth. Per the [facility] controlled substance reporting system does appear he has received for prescriptions of pain medication in the month of July but has not received anything in over a month. Will provide him with Percocet the emergency department and discharge with prescription for the same as this does appear to be a very uncomfortable lesion. There is no sign of any superimposed infection at this time and he has outpatient follow-up scheduled with [facility]. We did  discuss return precautions. I feel he is safe for discharge.  At this time, I do not feel there is any life-threatening condition present. I have reviewed and discussed all results (EKG, imaging, lab, urine as appropriate) and exam findings with patient/family. I have reviewed nursing notes and appropriate previous records.  I feel the patient is safe to be discharged home without further emergent workup and can continue workup as an outpatient as needed. Discussed usual and customary return precautions. Patient/family verbalize understanding and are comfortable with this plan.  Outpatient follow-up has been provided if needed. All questions have been answered.        Jhayden Demuro, Delice Bison, DO 10/11/16 [phone]

## 2016-10-16 HISTORY — PX: FREE FLAP RADIAL FOREARM: SHX1678

## 2016-10-16 HISTORY — PX: OTHER SURGICAL HISTORY: SHX169

## 2016-10-16 HISTORY — PX: TRACHEOSTOMY: SUR1362

## 2016-10-16 HISTORY — PX: NECK DISSECTION: SUR422

## 2016-10-16 HISTORY — PX: SKIN GRAFT: SHX250

## 2016-10-27 HISTORY — PX: GASTROSTOMY: SHX151

## 2016-11-17 ENCOUNTER — Telehealth: Payer: Self-pay | Admitting: *Deleted

## 2016-11-17 NOTE — Telephone Encounter (Signed)
Oncology Nurse Navigator Documentation  In follow-up to secure e-mail received from Dr. Thera Flake, [facility], re patient's release for post-surgical adjuvant RT, arranged 10/5 12:30 appt with Dr. Isidore Moos.  Spoke with [name], transportation coordinator [place] where he currently resides, she voiced understanding.  I provided her my phone number for future needs.  Dr. Gavin Pound and navigator Alfredo Martinez informed of appt via secure e-mail.  [name], RN, BSN, [place] Oncology Nurse [place] at [place] [phone]

## 2016-11-18 NOTE — Progress Notes (Signed)
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  10/16/16 FINAL PATHOLOGIC DIAGNOSIS MICROSCOPIC EXAMINATION AND DIAGNOSIS  A. "TUMOR NEXT TO BONE", EXCISION: Scant fragments of bone and dense fibrous tissue, negative for carcinoma.  B. "PHARYNGEAL WALL MARGIN", EXCISION: Squamous mucosa with acute inflammation and skeletal muscle, negative for carcinoma.  C. "HARD PALATE MARGIN", EXCISION: Squamous mucosa, negative for carcinoma.  D. "TONGUE BASE MARGIN", EXCISION: Squamous mucosa with chronic inflammation, negative for carcinoma.  E. "INFERIOR BUCCAL MUCOSA MARGIN", EXCISION: Squamous mucosa and skeletal muscle, negative for carcinoma.  F. "SUPERIOR BUCCAL MUCOSAL MARGIN", EXCISION: Squamous mucosa with chronic inflammation, negative for carcinoma.  G. LEVEL 1A NECK CONTENTS, LYMPH NODE DISSECTION: Three lymph nodes, negative for metastatic carcinoma (0/3).  H. LEFT LEVEL 1B NECK CONTENTS, LYMPH NODE DISSECTION: Metastatic carcinoma in two of five lymph nodes (2/5). Largest metastatic deposit measures 2.4 cm. Definitive extranodal extension is not identified. Salivary gland parenchyma, negative for tumor.  I. LEFT LEVEL 2B NECK CONTENTS, LYMPH NODE DISSECTION: Eight lymph nodes, negative for metastatic carcinoma (0/8).  J. LEFT LEVEL 2A, 3, 4, AND 5 NECK CONTENTS, LYMPH NODE DISSECTION: Metastatic carcinoma in one of twenty-two lymph nodes (1/22). Largest metastatic deposit measures 3.5 cm. Extranodal extension is present and measures 1.5 mm beyond the lymph node capsule.  K. LEFT TONSIL, SOFT PALATE, FLOOR OF MOUTH, BASE OF TONGUE, AND MANDIBLE, PARTIAL GLOSSOPHARYNGECTOMY AND PARTIAL MANDIBULECTOMY: Invasive squamous cell carcinoma, keratinizing type, moderately  differentiated, is identified forming a 3.7 x 1.8 x 1.0 cm mass in the left retromolar area.Tumor invades to a depth of 0.8 cm.Lymphovascular invasion is identified. Perineural invasion is identified.  The medial soft tissue margin is positive for invasive squamous cell carcinoma (focal).  L. LEFT PARAPHARYNGEAL MASS, EXCISION: Invasive squamous cell carcinoma forming a 1.2 x 1.2 x 0.5 cm mass. Invasive carcinoma involves the unoriented soft tissue margin over a linear length of 0.3 cm.  M. TEETH, REMOVAL: Two teeth (gross examination only).  Patient presented months ago with symptoms of: He reports pain to the left side of his mouth and ear which has been present for six months.   Biopsies of mouth revealed: Invasive Squamous cell carcinoma  Nutrition Status Yes No Comments  Weight changes? [x]  []  He has gained about 3-4 lbs in the past few weeks since surgery.   Swallowing concerns? [x]  []  He is swallowing pureed foods at his facility. He is drinking liquids without difficulty.   PEG? [x]  []  Yes, placed 10/27/16, he receives continuous tube feeding at night.    Referrals Yes No Comments  Social Work? []  [x]    Dentistry? []  [x]    Swallowing therapy? []  [x]    Nutrition? [x]  []    Med/Onc? []  [x]     Safety Issues Yes No Comments  Prior radiation? []  [x]    Pacemaker/ICD? []  [x]    Possible current pregnancy? []  [x]    Is the patient on methotrexate? []  [x]     Tobacco/Marijuana/Snuff/ETOH use: He tells me he is smoking one cigarette every other day.  He has a history of drinking. He stopped at the time of his surgery 10/16/16  Past/Anticipated interventions by otolaryngology, if any:  Dr. 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  ------- . FREE FLAP RADIAL FOREARM Left 10/16/2016  Procedure: FREE FLAP RADIAL FOREARM; Surgeon: Wyline Beady, MD; Location: [place] MAIN OR; Service: ENT;  Laterality: Left;  . FREE FLAP SCAPULAR Left  10/16/2016  Procedure: FREE FLAP SCAPULAR; Surgeon: Wyline Beady, MD; Location: [place] MA[facility] OR; Service: ENT; Laterality: Left;  Marland Kitchen GASTROSTOMY TUBE PLACEMENT N/A 10/27/2016  Procedure: GASTROSTOMY PERCUTANEOUS ENDOSCOPIC (PEG), Trach change; Surgeon: Rennis Golden, MD; Location: [place]; Service: General; Laterality: N/A;  . NECK DISSECTION Bilateral 10/16/2016  Procedure: SELECTIVE NECK DISSECTION; Surgeon: Wyline Beady, MD; Location: [place] MAIN OR; Service: ENT; Laterality: Bilateral;  . REMOVAL ORAL CANCER Left 10/16/2016  Procedure: RESECTION OF ORAL CANCER; Surgeon: Wyline Beady, MD; Location: [facility] MAIN OR; Service: ENT; Laterality: Left;  . SKIN GRAFT Left 10/16/2016  Procedure: SPLIT THICKNESS SKIN GRAFT LEG; Surgeon: Wyline Beady, MD; Location: [place] [facility] OR; Service: ENT; Laterality: Left;  . TRACHEOSTOMY N/A 10/16/2016  Procedure: TRACHEOTOMY; Surgeon: Wyline Beady, MD; Location: [place] [facility]IN OR; Service: ENT; Laterality: N/A;    Past/Anticipated interventions by medical oncology, if any:  N/A  Current Complaints / other details:   He is currently living at [facility] facility.   BP (!) 135/99   Pulse 80   Temp 98.2 F (36.8 C)   Ht 5\' 7"  (1.702 m)   Wt 125 lb 3.2 oz (56.8 kg)   SpO2 100% Comment: room air  BMI 19.61 kg/m    Wt Readings from Last 3 Encounters:  11/20/16 125 lb 3.2 oz (56.8 kg)  10/11/16 131 lb 8 oz (59.6 kg)  08/25/16 127 lb 3.2 oz (57.7 kg)

## 2016-11-20 ENCOUNTER — Telehealth: Payer: Self-pay | Admitting: *Deleted

## 2016-11-20 ENCOUNTER — Ambulatory Visit
Admission: RE | Admit: 2016-11-20 | Discharge: 2016-11-20 | Disposition: A | Discharge status: Home / Self Care | Payer: Medicaid Other | Source: Ambulatory Visit | Attending: Radiation Oncology | Admitting: Radiation Oncology

## 2016-11-20 ENCOUNTER — Encounter: Payer: Self-pay | Admitting: *Deleted

## 2016-11-20 ENCOUNTER — Encounter: Payer: Self-pay | Admitting: Radiation Oncology

## 2016-11-20 VITALS — BP 135/99 | HR 80 | Temp 98.2°F | Ht 67.0 in | Wt 125.2 lb

## 2016-11-20 DIAGNOSIS — K219 Gastro-esophageal reflux disease without esophagitis: Secondary | ICD-10-CM | POA: Insufficient documentation

## 2016-11-20 DIAGNOSIS — Z6820 Body mass index (BMI) 20.0-20.9, adult: Secondary | ICD-10-CM | POA: Diagnosis not present

## 2016-11-20 DIAGNOSIS — C062 Malignant neoplasm of retromolar area: Secondary | ICD-10-CM | POA: Diagnosis present

## 2016-11-20 DIAGNOSIS — R634 Abnormal weight loss: Secondary | ICD-10-CM | POA: Diagnosis not present

## 2016-11-20 DIAGNOSIS — Z79899 Other long term (current) drug therapy: Secondary | ICD-10-CM | POA: Insufficient documentation

## 2016-11-20 DIAGNOSIS — F17210 Nicotine dependence, cigarettes, uncomplicated: Secondary | ICD-10-CM | POA: Insufficient documentation

## 2016-11-20 DIAGNOSIS — I10 Essential (primary) hypertension: Secondary | ICD-10-CM | POA: Diagnosis not present

## 2016-11-20 DIAGNOSIS — M542 Cervicalgia: Secondary | ICD-10-CM | POA: Diagnosis not present

## 2016-11-20 DIAGNOSIS — Z8249 Family history of ischemic heart disease and other diseases of the circulatory system: Secondary | ICD-10-CM | POA: Diagnosis not present

## 2016-11-20 NOTE — Progress Notes (Signed)
Oncology Nurse Navigator Documentation  Met with Lawrence Arias during f/u consult with Dr. Isidore Moos.  He was unaccompanied. He reported:  Swallowing soft foods, drinking liquids wo/ difficulty.  Receiving continuous feed nutritional supplement via button PEG 6pm - 6am.    Expecting to stay at [facility] through end of this year. I discussed his attendance at next Tuesday morning's H&N MDC, he agreed, voiced understanding I will call [facility] to arrange. He voiced understanding appt with [facility] Dental Medicine and/or CT Chest will be coordinated for Tuesday as well if possible. He was encouraged to call me with needs/concerns  [name], RN, BSN, [place] at [place] [phone]

## 2016-11-20 NOTE — Telephone Encounter (Signed)
Oncology Nurse Navigator Documentation  Spoke with [facility] transportation coordinator [name], informed her Mr. Orvis has a 10/9 0830 appt at [facility], requested transport.  She voiced understanding.  [name], RN, BSN, [place] Oncology Nurse [place] at [place] [phone]

## 2016-11-20 NOTE — Progress Notes (Signed)
Radiation Oncology         [phone] ________________________________  Name: Lawrence Arias MRN: [id number]  Date: 11/20/2016  DOB: 1961/02/13  Follow-Up Visit Note  CC: System, Provider Not In  Melissa Montane, MD  Diagnosis and Prior Radiotherapy:       ICD-10-CM   1. 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 TSH    Ambulatory referral to Social Work    Ambulatory referral to Oncology    Ambulatory referral to Physical Therapy    Amb Referral to Nutrition and Diabetic E    Referral to Neuro Rehab    Ambulatory referral to Dentistry    BUN    Creatinine, serum    CT Soft Tissue Neck W Contrast    CT Chest W Contrast   -Pathologic staging: T2N3bMx squamous cell carcinoma, Grade 2, of the left retromolar area, with PNI and LVSI and ECEmi. Margin status unclear.  CHIEF COMPLAINT:  Here for follow-up and surveillance of invasive squamous cell carcinoma  Narrative:  The patient returns today for routine follow-up. He had surgery by Dr Hendricks Limes at [facility] 10-16-16 with results of  TUMOR SITE: Retromolar area TUMOR LATERALITY: Left TUMOR FOCALITY: Unifocal TUMOR SIZE: GREATEST DIMENSION:3.7 cm ADDITIONAL DIMENSIONS: 1.8 x 1.0 cm TUMOR DEPTH OF INVASION (DOI): 8 mm HISTOLOGIC TYPE: Squamous cell carcinoma, conventional HISTOLOGIC GRADE: G2: Moderately differentiated TUMOR EXTENSION: Tumor extends to involve the left parapharyngeal space (Specimen L) SPECIMEN MARGINS: Involved by invasive tumor at medial soft tissue margin (Specimen K).Involved by invasive tumor at the unoriented soft tissue margin of the parapharyngeal space excision (Specimen L). TUMOR BED (SEPARATELY SUBMITTED) MARGIN ORIENTATION: Unoriented to true margin surface MARGINS: Uninvolved by invasive tumor LYMPHOVASCULAR INVASION: Present PERINEURAL INVASION: Present REGIONAL LYMPH NODES: NUMBER OF LYMPH NODES INVOLVED: 3 NUMBER OF LYMPH NODES EXAMINED: 34 LATERALITY OF  LYMPH NODES INVOLVED: Ipsilateral SIZE OF LARGEST METASTATIC DEPOSIT: 3.5 cm EXTRANODAL EXTENSION: Present DISTANCE FROM LYMPH NODE CAPSULE: 1.5 mm(ENEmi) PATHOLOGIC STAGE CLASSIFICATION (pTNM, AJCC 8th Ed): pT2 pN3b  Pt had a CXR completed on 10/24/2016 with results revealing: 1.Tracheostomy tube at the thoracic inlet and enteric tube tip projects over the stomach. 2.No acute cardiopulmonary disease. Pt had a [place] Urinary system/renal study completd on 10/28/2016 with results revealing: 1.Findings compatible with medical renal disease. 2.No hydronephrosis. Pt had a CT Neck/Thyroid complted on 11/04/2016 with results revealing: IMPRESSION: 1. Postoperative changes of the left neck, floor of mouth and left oropharynx and hypopharynx related to recent squamous cell carcinoma resection. Peripherally enhancing gas and fluid collection in the left neck just inferior to the angle of the mandible may be postoperative, but this far removed from the time of surgery, it is concerning for infection. Large amount of left facial soft tissue swelling may indicate cellulitis. 2. Assessment for recurrent or residual neoplasm is limited due to the degree of postoperative change and lack of prior studies for comparison.  He returns to the office today to discuss radiotherapy options. He states that he is doing well since the surgery that was completed at [facility] reports that he was referred to Dr. Enrique Sack to aid with dentistry in the past but he didn't go to the appointment. He denies being evaluated by a dentist at [facility]. He notes that the has a feeding tube that is working. He states that he had once swallowing therapy session completed at [facility]. He denies being on a thyroid medication at this time.  He is in a care facility.  ALLERGIES:  has No Known Allergies.  Meds: Current Outpatient Prescriptions  Medication Sig Dispense Refill  . acetaminophen (TYLENOL) 500 MG tablet Take  1,000 mg by mouth every 6 (six) hours as needed for moderate pain.    Marland Kitchen amLODipine (NORVASC) 5 MG tablet Take 5 mg by mouth daily.  0  . buPROPion (WELLBUTRIN SR) 150 MG 12 hr tablet Start one week before quit date. Take 1 tab daily x 3 days, then 1 tab BID thereafter. 60 tablet 2  . gabapentin (NEURONTIN) 300 MG capsule Take 1 capsule (300 mg total) by mouth 3 (three) times daily. To address cancer related pain. 90 capsule 0  . lidocaine (XYLOCAINE) 2 % solution Use as directed 20 mLs in the mouth or throat as needed for mouth pain. 100 mL 0  . nicotine (NICODERM CQ - DOSED IN MG/24 HOURS) 14 mg/24hr patch Place 1 patch (14 mg total) onto the skin daily. Apply 21 mg patch daily x 6 wk, then 14mg  patch daily x 2 wk, then 7 mg patch daily x 2 wk (Patient not taking: Reported on 11/20/2016) 14 patch 0  . nicotine (NICODERM CQ - DOSED IN MG/24 HOURS) 21 mg/24hr patch Place 1 patch (21 mg total) onto the skin daily. Apply 21 mg patch daily x 6 wk, then 14mg  patch daily x 2 wk, then 7 mg patch daily x 2 wk (Patient not taking: Reported on 11/20/2016) 14 patch 2  . nicotine (NICODERM CQ - DOSED IN MG/24 HR) 7 mg/24hr patch Place 1 patch (7 mg total) onto the skin daily. Apply 21 mg patch daily x 6 wk, then 14mg  patch daily x 2 wk, then 7 mg patch daily x 2 wk (Patient not taking: Reported on 11/20/2016) 14 patch 0  . oxyCODONE-acetaminophen (PERCOCET/ROXICET) 5-325 MG tablet Take 1-2 tablets by mouth every 6 (six) hours as needed. (Patient not taking: Reported on 11/20/2016) 20 tablet 0   No current facility-administered medications for this encounter.     Physical Findings: The patient is in no acute distress. Patient is alert and oriented. Wt Readings from Last 3 Encounters:  11/20/16 125 lb 3.2 oz (56.8 kg)  10/11/16 131 lb 8 oz (59.6 kg)  08/25/16 127 lb 3.2 oz (57.7 kg)    height is 5\' 7"  (1.702 m) and weight is 125 lb 3.2 oz (56.8 kg). His temperature is 98.2 F (36.8 C). His blood pressure is  135/99 (abnormal) and his pulse is 80. His oxygen saturation is 100%. .  General: Alert and oriented, in no acute distress HEENT: Head is normocephalic. Extraocular movements are intact. Oropharynx is notable for graft in left posterior oral cavity appears healthy. Tongue deviates slightly to the right. Patient is missing several teeth.  Neck: Neck is notable for lymphedema and firmness throughout the neck and submental region, particularly on the left side. Trach in place. Surgical scars are healing well.  Heart: Regular in rate and rhythm with no murmurs, rubs, or gallops. Chest: Clear to auscultation bilaterally, with no rhonchi or rales. Low pitched wheeze in the bases bilaterally.  Abdomen: Soft, nontender, nondistended, with no rigidity or guarding. PEG tube intact with dried formula around the stoma.  Extremities: No cyanosis or edema. Lymphatics: see Neck Exam Psychiatric: Judgment and insight are intact. Affect is appropriate.     Lab Findings: Lab Results  Component Value Date   WBC 3.9 (L) 05/21/2016   HGB 10.8 (L) 07/24/2016   HCT 35.9 (L) 05/21/2016   MCV 95.0  05/21/2016   PLT 275 05/21/2016     Radiographic Findings: No results found.  Impression/Plan:    1) Head and Neck Cancer Status: High Risk post operative disease in need of adjuvant therapy. Discussed 6-7 week course of radiotherapy in detail - We discussed the potential risks, benefits, and side effects of radiotherapy. We talked in detail about acute and late effects. We discussed that some of the most bothersome acute effects may be mucositis, dysgeusia, salivary changes, skin irritation, hair loss, dehydration, weight loss and fatigue. We talked about late effects which include but are not necessarily limited to dysphagia, hypothyroidism, nerve injury, spinal cord injury, xerostomia, trismus, and neck edema. No guarantees of treatment were given. A consent form was signed and placed in the patient's medical  record. The patient is enthusiastic about proceeding with treatment. I look forward to participating in the patient's care.    2) Nutritional Status: Reliant on PEG tube, will refer to Nutritionist for further support.  PEG tube: Yes, placed on 10/27/16 and the patient receives continuous tube feedings at night.   3) Risk Factors: The patient has been educated about risk factors including alcohol and tobacco abuse; they understand that avoidance of alcohol and tobacco is important to prevent recurrences as well as other cancers  4) Swallowing: He is swallowing pureed foods and drinking PO liquids without difficulty  5) Dental: Encouraged to continue regular followup with dentistry, and dental hygiene including fluoride rinses.   6) Thyroid function: will recheck soon   7) Other: Will refer the patient to Dr. Irene Limbo for Medical Oncology follow up.  Margin status unclear. [name], RN, our Head and Neck Oncology Navigator will ask [facility] pathology to clarify and add to tumor board  8) Follow-up in 4 days in [facility]. After consultation on 08/25/2016, I ordered a CT Chest but it doesn't appear to have been done. CT Chest will be re-ordered during today's visit. I'll also restage neck with up to date CT.  Some patients recur within weeks.  The patient was encouraged to call with any issues or questions before then. Refer to Dentistry.  Simulation upon completion of these appointments and clearance by Dr Enrique Sack _____________________________________   Eppie Gibson, MD   This document serves as a record of services personally performed by Eppie Gibson, MD. It was created on her behalf by [facility], a trained medical scribe. The creation of this record is based on the scribe's personal observations and the provider's statements to them. This document has been checked and approved by the attending provider.

## 2016-11-24 ENCOUNTER — Ambulatory Visit: Payer: Medicaid Other | Attending: Radiation Oncology

## 2016-11-24 ENCOUNTER — Ambulatory Visit
Admission: RE | Admit: 2016-11-24 | Discharge: 2016-11-24 | Disposition: A | Discharge status: Home / Self Care | Payer: Medicaid Other | Source: Ambulatory Visit | Attending: Radiation Oncology | Admitting: Radiation Oncology

## 2016-11-24 ENCOUNTER — Encounter: Payer: Self-pay | Admitting: *Deleted

## 2016-11-24 ENCOUNTER — Encounter (HOSPITAL_COMMUNITY): Payer: Self-pay | Admitting: Dentistry

## 2016-11-24 ENCOUNTER — Ambulatory Visit: Payer: Medicaid Other | Admitting: Nutrition

## 2016-11-24 ENCOUNTER — Ambulatory Visit: Payer: Medicaid Other | Admitting: Physical Therapy

## 2016-11-24 ENCOUNTER — Ambulatory Visit (HOSPITAL_COMMUNITY): Payer: Medicaid - Dental | Admitting: Dentistry

## 2016-11-24 VITALS — BP 141/99 | HR 85 | Temp 98.2°F

## 2016-11-24 VITALS — BP 152/108 | HR 67 | Temp 98.1°F | Wt 124.8 lb

## 2016-11-24 DIAGNOSIS — R600 Localized edema: Secondary | ICD-10-CM

## 2016-11-24 DIAGNOSIS — K036 Deposits [accretions] on teeth: Secondary | ICD-10-CM

## 2016-11-24 DIAGNOSIS — R29898 Other symptoms and signs involving the musculoskeletal system: Secondary | ICD-10-CM | POA: Insufficient documentation

## 2016-11-24 DIAGNOSIS — K0530 Chronic periodontitis, unspecified: Secondary | ICD-10-CM

## 2016-11-24 DIAGNOSIS — K06010 Localized gingival recession, unspecified: Secondary | ICD-10-CM

## 2016-11-24 DIAGNOSIS — K031 Abrasion of teeth: Secondary | ICD-10-CM

## 2016-11-24 DIAGNOSIS — K0889 Other specified disorders of teeth and supporting structures: Secondary | ICD-10-CM

## 2016-11-24 DIAGNOSIS — C062 Malignant neoplasm of retromolar area: Secondary | ICD-10-CM

## 2016-11-24 DIAGNOSIS — Z01818 Encounter for other preprocedural examination: Secondary | ICD-10-CM

## 2016-11-24 DIAGNOSIS — K08409 Partial loss of teeth, unspecified cause, unspecified class: Secondary | ICD-10-CM

## 2016-11-24 DIAGNOSIS — M264 Malocclusion, unspecified: Secondary | ICD-10-CM

## 2016-11-24 DIAGNOSIS — K06020 Generalized gingival recession, unspecified: Secondary | ICD-10-CM
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
## 2016-11-24 NOTE — Progress Notes (Signed)
Patient was seen during head and neck clinic  56 year old male diagnosed with left retromolar trigone SCC.  Past medical history includes hypertension, GERD, tobacco, trach, PEG.  Medications include Wellbutrin.  Labs were reviewed.  Height: 67 inches. Weight: 124.8 pounds. Usual body weight: 145 pounds per patient. BMI: 19.55.  Patient reports he has difficulty swallowing and is consuming a pured/soft diet. He denies nausea, vomiting, constipation, and diarrhea. Patient states he receives continuous tube feeding for 12 hours overnight via his PEG.  Patient does not know the name of his tube feeding but is sure it runs at 100 mL an hour. Patient frustrated that his weight is stable. Verbalizes increased desire to eat soft textures instead of pured food.  Nutrition diagnosis:  Unintended weight loss related to retromolar trigone SCC as evidenced by 18% weight loss from usual body weight.  Intervention: Patient educated to continue strategies for increased calorie and protein intake at mealtimes. Educated patient to begin Ensure Plus 3 times a day between meals. Patient should continue continuous tube feedings for now in an effort to support weight gain. Will contact [facility] [facility] and get information on Tube Feeding. Questions were answered.  Teach back method used.  Contact information provided.  Monitoring, evaluation, goals: Patient will tolerate increased calories and protein to minimize weight loss.  Next visit: To be scheduled with treatments weekly.  **Disclaimer: This note was dictated with voice recognition software. Similar sounding words can inadvertently be transcribed and this note may contain transcription errors which may not have been corrected upon publication of note.**

## 2016-11-24 NOTE — Progress Notes (Signed)
Oncology Nurse Navigator Documentation  Met with Mr. Audia during [facility].  He was unaccompanied.  Provided verbal and written overview of [facility], the clinicians who will be seeing him, encouraged him to ask questions during his time with them.  He was seen by Nutrition,  PT, and [place].  Spoke with him at end of [facility], addressed questions. I encouraged him to call me with needs/concerns.  [name], RN, BSN, [facility] [place] [phone]

## 2016-11-24 NOTE — Patient Instructions (Signed)

## 2016-11-24 NOTE — Progress Notes (Signed)
LIMITED ORAL EXAMINATION  Date of Examiantion:  11/24/2016 Patient Name:   Lawrence Arias Date of Birth:   1960-05-17 Medical Record Number: [id number]  VITALS: BP (!) 141/99 (BP Location: Left Arm)   Pulse 85   Temp 98.2 F (36.8 C) (Oral)   CHIEF COMPLAINT: Patient referred by Dr. Isidore Moos for a pre-radiation therapy dental examination.   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 Patient had surgical resection on 10/16/2016 with Dr. Hendricks Limes at [facility] to include left segmental mandibulectomy, left partial oropharyngectomy, left floor of mouth resection, left base of tongue resection,  left selective neck dissection, extraction of tooth #'s 16 and 17, and complex reconstructive surgery including left mandibular plating. Patient is anticipated to receive postoperative radiation therapy with Dr. Isidore Moos. Patient is now seen as part of a medically necessary pre-radiation therapy dental protocol examination.  Patient currently denies acute toothaches, swellings, or abscesses. Patient is complaining of intermittent pain associated with the cancer surgery. Patient has a maximum interincisal opening of approximately 35 mm. Patient was last seen by his primary dentist in March of 2017 for an exam and cleaning. Patient saw Dr. Iantha Fallen at that time. Patient was seen prior to the surgical resection by Dental Medicine on 08/25/2016. At that time it was recommended that tooth numbers 10, 16, 17, 19, 23, 24, 25, 26, 31, and 32 be extracted. Patient initially refused these extractions pending consultation appointment with [facility] head and neck surgeons. These extraction recommendations were forwarded to [facility] head and neck surgeons. The only teeth extracted were tooth numbers 16 and 17 that were extracted during the head and neck surgeon  procedure.  PROBLEM LIST: Patient Active Problem List   Diagnosis Date Noted  . Squamous cell cancer of retromolar trigone ([place]) 08/18/2016    Priority: High  . Oral cancer ([place]) 08/25/2016    PMH: Past Medical History:  Diagnosis Date  . Cancer ([place])   . GERD (gastroesophageal reflux disease)    uses OTC prn  . Hypertension   . Tongue lesion     PSH: Past Surgical History:  Procedure Laterality Date  . DIRECT LARYNGOSCOPY N/A 07/24/2016   Procedure: DIRECT LARYNGOSCOPY;  Surgeon: Melissa Montane, MD;  Location: [place];  Service: ENT;  Laterality: N/A;  Direct laryngoscopy with biopsy tongue lesion  . FRACTURE SURGERY     left wrist, remote  . FREE FLAP RADIAL FOREARM Left 10/16/2016   Dr. Hendricks [facility]  . free flap scapular Left 10/16/2016   Dr. Hendricks Limes - [facility] hospital  . GASTROSTOMY  10/27/2016   Dr. Daphene Calamity- [facility] hospital.   . NECK DISSECTION Bilateral 10/16/2016   Selective neck dissection, Dr. Hendricks Limes [facility]  . 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 Hendricks Limes [facility]  . SKIN GRAFT Left 10/16/2016   Split thickness skin graft, leg Dr. Hendricks Limes. [facility]  . TONGUE BIOPSY  07/24/2016   Procedure: TONGUE BIOPSY;  Surgeon: Melissa Montane, MD;  Location: [place];  Service: ENT;;  . TRACHEOSTOMY  10/16/2016   Dr. Hendricks Limes [facility]    ALLERGIES: No Known Allergies  MEDICATIONS: Current Outpatient Prescriptions  Medication Sig Dispense Refill  . acetaminophen (TYLENOL) 500 MG tablet Take 1,000 mg by mouth every 6 (six) hours as needed for moderate pain.    Marland Kitchen amLODipine (NORVASC)  5 MG tablet Take 5 mg by mouth daily.  0  . buPROPion (WELLBUTRIN SR) 150 MG 12 hr tablet Start one week before quit date. Take 1 tab daily x 3 days, then 1 tab BID thereafter. 60 tablet 2  . gabapentin (NEURONTIN) 300 MG capsule Take 1 capsule (300 mg total) by mouth 3 (three) times daily. To address cancer related  pain. 90 capsule 0  . lidocaine (XYLOCAINE) 2 % solution Use as directed 20 mLs in the mouth or throat as needed for mouth pain. 100 mL 0  . nicotine (NICODERM CQ - DOSED IN MG/24 HOURS) 14 mg/24hr patch Place 1 patch (14 mg total) onto the skin daily. Apply 21 mg patch daily x 6 wk, then 14mg  patch daily x 2 wk, then 7 mg patch daily x 2 wk (Patient not taking: Reported on 11/20/2016) 14 patch 0  . nicotine (NICODERM CQ - DOSED IN MG/24 HOURS) 21 mg/24hr patch Place 1 patch (21 mg total) onto the skin daily. Apply 21 mg patch daily x 6 wk, then 14mg  patch daily x 2 wk, then 7 mg patch daily x 2 wk (Patient not taking: Reported on 11/20/2016) 14 patch 2  . nicotine (NICODERM CQ - DOSED IN MG/24 HR) 7 mg/24hr patch Place 1 patch (7 mg total) onto the skin daily. Apply 21 mg patch daily x 6 wk, then 14mg  patch daily x 2 wk, then 7 mg patch daily x 2 wk (Patient not taking: Reported on 11/20/2016) 14 patch 0  . oxyCODONE-acetaminophen (PERCOCET/ROXICET) 5-325 MG tablet Take 1-2 tablets by mouth every 6 (six) hours as needed. (Patient not taking: Reported on 11/20/2016) 20 tablet 0   No current facility-administered medications for this visit.     LABS: Lab Results  Component Value Date   WBC 3.9 (L) 05/21/2016   HGB 10.8 (L) 07/24/2016   HCT 35.9 (L) 05/21/2016   MCV 95.0 05/21/2016   PLT 275 05/21/2016      Component Value Date/Time   NA 129 (L) 05/21/2016 1811   K 5.2 (H) 05/21/2016 1811   CL 93 (L) 05/21/2016 1811   CO2 19 (L) 05/21/2016 1811   GLUCOSE 87 05/21/2016 1811   BUN 12 05/21/2016 1811   CREATININE 0.98 05/21/2016 1811   CALCIUM 9.5 05/21/2016 1811   GFRNONAA >60 05/21/2016 1811   GFRAA >60 05/21/2016 1811   Lab Results  Component Value Date   INR 1.1 08/25/2008   INR 1.1 01/25/2007   INR 1.1 09/26/2006   No results found for: PTT  SOCIAL HISTORY: Social History   Social History  . Marital status: Single    Spouse name: N/A  . Number of children: 3  . Years of  education: N/A   Occupational History  . Not on file.   Social History Main Topics  . Smoking status: Current Some Day Smoker    Packs/day: 0.50    Types: Cigarettes  . Smokeless tobacco: Never Used     Comment: he is smoking 0ne every other day (11/20/16)  . Alcohol use No  . Drug use: No  . Sexual activity: Not on file   Other Topics Concern  . Not on file   Social History Narrative  . No narrative on file    FAMILY HISTORY: Family History  Problem Relation Age of Onset  . Hypertension Mother   . Hypertension Father     REVIEW OF SYSTEMS: Reviewed with the patient as per History of present illness. Psych:  Patient denies having dental phobia.  DENTAL HISTORY: CHIEF COMPLAINT: Patient referred by Dr. Isidore Moos for a pre-radiation therapy dental examination.   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 Patient had surgical resection on 10/16/2016 with Dr. Hendricks Limes at [facility] to include left segmental mandibulectomy, left partial oropharyngectomy, left floor of mouth resection, left base of tongue resection,  left selective neck dissection, extraction of tooth #'s 16 and 17, and complex reconstructive surgery including left mandibular plating. Patient is anticipated to receive postoperative radiation therapy with Dr. Isidore Moos. Patient is now seen as part of a medically necessary pre-radiation therapy dental protocol examination.  Patient currently denies acute toothaches, swellings, or abscesses. Patient is complaining of intermittent pain associated with the cancer surgery. Patient has a maximum interincisal opening of approximately 35 mm. Patient was last seen by his primary dentist in March of 2017 for an exam and cleaning. Patient saw Dr. Iantha Fallen at that time. Patient was seen prior to the surgical resection by Dental Medicine on 08/25/2016. At that time it was  recommended that tooth numbers 10, 16, 17, 19, 23, 24, 25, 26, 31, and 32 be extracted. Patient initially refused these extractions pending consultation appointment with [facility] head and neck surgeons. These extraction recommendations were forwarded to [facility] head and neck surgeons. CHIEF COMPLAINT: Patient referred by Dr. Isidore Moos for a pre-radiation therapy dental examination.   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 Patient had surgical resection on 10/16/2016 with Dr. Hendricks Limes at [facility] to include left segmental mandibulectomy, left partial oropharyngectomy, left floor of mouth resection, left base of tongue resection,  left selective neck dissection, extraction of tooth #'s 16 and 17, and complex reconstructive surgery including left mandibular plating. Patient is anticipated to receive postoperative radiation therapy with Dr. Isidore Moos. Patient is now seen as part of a medically necessary pre-radiation therapy dental protocol examination.  Patient currently denies acute toothaches, swellings, or abscesses. Patient is complaining of intermittent pain associated with the cancer surgery. Patient has a maximum interincisal opening of approximately 35 mm. Patient was last seen by his primary dentist in March of 2017 for an exam and cleaning. Patient saw Dr. Iantha Fallen at that time. Patient was seen prior to the surgical resection by Dental Medicine on 08/25/2016. At that time it was recommended that tooth numbers 10, 16, 17, 19, 23, 24, 25, 26, 31, and 32 be extracted. Patient initially refused these extractions pending consultation appointment with [facility] head and neck surgeons. These extraction recommendations were forwarded to [facility] head and neck surgeons. The only teeth extracted were tooth numbers 16 and 17  that were extracted during the head and neck surgeon procedure.   DENTAL EXAMINATION: GENERAL: The patient is a well-developed, slightly built male in no acute distress. HEAD AND NECK: Patient has left head and neck consistent with surgical resection and reconstructive surgery.  INTRAORAL EXAM: The patient has maximum interincisal opening of 35 mm with deviation to the right on maximum opening. Patient has incipient xerostomia. Patient has left soft palate, left buccal mucosa, and left mandible consistent with surgical resection and reconstructive surgery.  DENTITION: Patient is missing tooth numbers 1-4, 7, 8, 9, 14, 15, 16, 17 and 18. PERIODONTAL: Patient has chronic periodontitis with plaque and calculus accumulations, generalized gingival  recession, and tooth mobility as per dental charting form. There is moderate to severe bone loss noted. DENTAL CARIES/SUBOPTIMAL RESTORATIONS: Multiple flexure lesions noted as per dental charting form. ENDODONTIC: Patient currently denies acute pulpitis symptoms. There is no evidence of periapical pathology or radiolucency. CROWN AND BRIDGE: There are no crown restorations noted. PROSTHODONTIC: Patient has no partial dentures. OCCLUSION: Patient has a poor occlusal scheme secondary to multiple missing teeth, supra-eruption and drifting of the unopposed teeth into the edentulous areas, and a class III type of malocclusion involving 23 through 26. Patient has deviation from left to right on maximum opening.  RADIOGRAPHIC INTERPRETATION: An orthopantogram was taken.  There are multiple missing teeth. There is moderate to severe bone loss. There is supra-eruption and drifting of the unopposed teeth into the edentulous areas. The patient has surgical resection consistent with left coronoidectomy, left mandible resection from the angle of the mandible to the distal of #19, additional bone graft to the left mandible with plating and screws from the left ramus to the  mesial of #22. Multiple surgical clips are noted.  ASSESSMENTS: 1. Squamous cell carcinoma of the left retromolar trigone with extension into the soft palate 2. Status post surgical resection and reconstructive surgery including bone graft, flap reconstruction, and plating from the left ramus to the word of the mandible in the area of #22.  3. Preradiation therapy dental protocol 4. Chronic periodontitis with bone loss 5. Gingival recession 6. Accretions 7. Tooth mobility 8. Multiple flexure lesions  9. Multiple missing teeth 10. Supra-eruption and drifting of the unopposed teeth into the edentulous areas 11. No history of partial dentures 12. Poor occlusal scheme and malocclusion   PLAN/RECOMMENDATIONS: 1. I discussed the risks, benefits, and complications of various treatment options with the patient in relationship to his medical and dental conditions, previous surgical resection and reconstructive surgery, anticipated postoperative radiation therapy, and risk for radiation therapy side effects to include xerostomia, radiation caries, trismus, mucositis, taste changes, gum and jawbone changes, and risk for infection and osteoradionecrosis. We discussed various treatment options to include no treatment, extraction of tooth numbers 10, 19, 23 - 26, 31 and 32 with alveoloplasty, pre-prosthetic surgery as indicated, periodontal therapy, dental restorations, root canal therapy, crown and bridge therapy, implant therapy, and replacement of missing teeth as indicated. We also discussed referral to an oral surgeon due to the complexity of the surgical treatment at this time. The patient currently agrees to be referred to an oral surgeon for evaluation for surgical options at this time.  However, prior to referral to an oral surgeon, I will discuss the anticipated ports and doses with Dr. Isidore Moos as well as the ability to proceed with dental extractions at this time.  Tooth number 19 is at risk for  osteoradionecrosis due to proximity of the anticipated ports and doses of 6000 cGy or more. Tooth numbers 10, 23, 24, 25, and 26 ideally need to be extracted secondary to periodontal disease and tooth mobility. Tooth numbers 31 and 32 could be considered for extraction secondary to significant supra-eruption into the ideal occlusal plane. A prescription for PreviDent 5000+ will be prescribed for the patient for use at bedtime. Patient is to apply a small amount of gel to the toothbrush. Patient is to then brush his teeth for 2 minutes and spit out excess. Refills to be given for one year.  2. Discussion of findings with medical team and coordination of future medical and dental care as needed.    Pete Glatter.  Enrique Sack, DDS

## 2016-11-26 ENCOUNTER — Telehealth: Payer: Self-pay | Admitting: *Deleted

## 2016-11-26 NOTE — Telephone Encounter (Signed)
CALLED PATIENT TO INFORM OF CT FOR 11-30-16 - ARRIVAL TIME - 2:45 PM @ [facility], PT. TO BE NPO- 4 HRS. PRIOR TO TEST, SPOKE WITH PATIENT AND HE IS AWARE OF THIS SCAN

## 2016-11-27 ENCOUNTER — Telehealth: Payer: Self-pay | Admitting: *Deleted

## 2016-11-27 NOTE — Telephone Encounter (Signed)
Oncology Nurse Navigator Documentation  Rec'd call back from [facility] transportation coordinator, [name].  Informed her of patient's 10/15 3:00 pm CT [facility] with 2:45 arrival.  She agreed to reiterate NPO status 4 hrs prior to scan with patient's RN.  [name], RN, BSN, [place] Neck Oncology Nurse [place] at [place] [phone]

## 2016-11-30 ENCOUNTER — Ambulatory Visit (HOSPITAL_COMMUNITY)
Admission: RE | Admit: 2016-11-30 | Discharge: 2016-11-30 | Disposition: A | Discharge status: Home / Self Care | Payer: Medicaid Other | Source: Ambulatory Visit | Attending: Radiation Oncology | Admitting: Radiation Oncology

## 2016-11-30 ENCOUNTER — Telehealth: Payer: Self-pay | Admitting: *Deleted

## 2016-11-30 DIAGNOSIS — C062 Malignant neoplasm of retromolar area: Secondary | ICD-10-CM | POA: Insufficient documentation

## 2016-11-30 MED ORDER — IOPAMIDOL (ISOVUE-300) INJECTION 61%
INTRAVENOUS | Status: AC
  Filled 2016-11-30: qty 75

## 2016-11-30 MED ORDER — IOPAMIDOL (ISOVUE-300) INJECTION 61%
75.0000 mL | Freq: Once | INTRAVENOUS | Status: AC | PRN
  Administered 2016-11-30: 75 mL via INTRAVENOUS

## 2016-11-30 NOTE — Telephone Encounter (Signed)
Oncology Nurse Navigator Documentation  Coordinated 10/16 1430 appt with oral surgeon Owsley and [facility] transportation coordinator [name].  [name], RN, BSN, [place] at [place] [phone]

## 2016-12-02 ENCOUNTER — Telehealth: Payer: Self-pay

## 2016-12-02 ENCOUNTER — Telehealth: Payer: Self-pay | Admitting: *Deleted

## 2016-12-02 NOTE — Telephone Encounter (Signed)
Oncology Nurse Navigator Documentation  Spoke with [facility] transportation coordinator, [name], informed her of pt's 10/18 0940 [place] appt and 10/24 1030 oral surgeon Owsley appt.   Spoke with pt's RN Crystal, informed her of NPO status starting midnight 10/23 with exception of morning BP medication (per Romana Juniper, Dr. Benson Norway).   Spoke with pt, informed him of appt tomorrow, confirmed his understanding of 10/24 appt, NPO requirement.  [name], RN, BSN, [place] Neck Oncology Nurse [place] at [place] [phone]

## 2016-12-02 NOTE — Telephone Encounter (Signed)
At Dr. Pearlie Oyster request I called [name] and gave him results from his recent CT Chest. There was no evidence of metastatic disease. [name] was very pleased with this result and voiced his appreciation for the phone call. He knows to call if he has any further questions or concerns.

## 2016-12-03 ENCOUNTER — Telehealth: Payer: Self-pay

## 2016-12-03 ENCOUNTER — Encounter: Payer: Self-pay | Admitting: Hematology and Oncology

## 2016-12-03 ENCOUNTER — Telehealth: Payer: Self-pay | Admitting: *Deleted

## 2016-12-03 ENCOUNTER — Ambulatory Visit (HOSPITAL_BASED_OUTPATIENT_CLINIC_OR_DEPARTMENT_OTHER): Payer: Medicaid Other | Admitting: Hematology and Oncology

## 2016-12-03 ENCOUNTER — Ambulatory Visit (HOSPITAL_BASED_OUTPATIENT_CLINIC_OR_DEPARTMENT_OTHER): Payer: Medicaid Other

## 2016-12-03 VITALS — BP 148/108 | HR 95 | Temp 98.4°F | Resp 18 | Ht 67.0 in | Wt 131.6 lb

## 2016-12-03 DIAGNOSIS — C062 Malignant neoplasm of retromolar area: Secondary | ICD-10-CM

## 2016-12-03 DIAGNOSIS — Z720 Tobacco use: Secondary | ICD-10-CM

## 2016-12-03 DIAGNOSIS — R740 Nonspecific elevation of levels of transaminase and lactic acid dehydrogenase [LDH]: Secondary | ICD-10-CM

## 2016-12-03 DIAGNOSIS — R7401 Elevation of levels of liver transaminase levels: Secondary | ICD-10-CM

## 2016-12-03 DIAGNOSIS — G893 Neoplasm related pain (acute) (chronic): Secondary | ICD-10-CM | POA: Diagnosis not present

## 2016-12-03 DIAGNOSIS — C770 Secondary and unspecified malignant neoplasm of lymph nodes of head, face and neck: Secondary | ICD-10-CM

## 2016-12-03 LAB — COMPREHENSIVE METABOLIC PANEL
ALT: 37 U/L (ref 0–55)
ANION GAP: 10 meq/L (ref 3–11)
AST: 25 U/L (ref 5–34)
Albumin: 3.8 g/dL (ref 3.5–5.0)
Alkaline Phosphatase: 105 U/L (ref 40–150)
BUN: 11.2 mg/dL (ref 7.0–26.0)
CALCIUM: 9.7 mg/dL (ref 8.4–10.4)
CO2: 27 meq/L (ref 22–29)
Chloride: 103 mEq/L (ref 98–109)
Creatinine: 0.8 mg/dL (ref 0.7–1.3)
Glucose: 96 mg/dl (ref 70–140)
Potassium: 4.1 mEq/L (ref 3.5–5.1)
Sodium: 140 mEq/L (ref 136–145)
TOTAL PROTEIN: 8 g/dL (ref 6.4–8.3)
Total Bilirubin: 0.24 mg/dL (ref 0.20–1.20)

## 2016-12-03 LAB — CBC WITH DIFFERENTIAL/PLATELET
BASO%: 1.7 % (ref 0.0–2.0)
Basophils Absolute: 0.1 10*3/uL (ref 0.0–0.1)
EOS ABS: 0.3 10*3/uL (ref 0.0–0.5)
EOS%: 5.9 % (ref 0.0–7.0)
HCT: 30.1 % — ABNORMAL LOW (ref 38.4–49.9)
HGB: 10.1 g/dL — ABNORMAL LOW (ref 13.0–17.1)
LYMPH%: 36.3 % (ref 14.0–49.0)
MCH: 33.8 pg — ABNORMAL HIGH (ref 27.2–33.4)
MCHC: 33.7 g/dL (ref 32.0–36.0)
MCV: 100.5 fL — AB (ref 79.3–98.0)
MONO#: 0.4 10*3/uL (ref 0.1–0.9)
MONO%: 9.7 % (ref 0.0–14.0)
NEUT%: 46.4 % (ref 39.0–75.0)
NEUTROS ABS: 2.1 10*3/uL (ref 1.5–6.5)
PLATELETS: 262 10*3/uL (ref 140–400)
RBC: 2.99 10*6/uL — AB (ref 4.20–5.82)
RDW: 18 % — ABNORMAL HIGH (ref 11.0–14.6)
WBC: 4.5 10*3/uL (ref 4.0–10.3)
lymph#: 1.7 10*3/uL (ref 0.9–3.3)

## 2016-12-03 NOTE — Progress Notes (Signed)
START ON PATHWAY REGIMEN - Head and Neck     A cycle is every 21 days:     Cisplatin   **Always confirm dose/schedule in your pharmacy ordering system**    Patient Characteristics: Oral Cavity, Stage III, IVA, IVB; Unresectable Disease Classification: Oral Cavity AJCC T Category: T2 Current Disease Status: No Distant Metastases and No Recurrent Disease AJCC M Category: M0 AJCC N Category: pN3 AJCC 8 Stage Grouping: IVB Intent of Therapy: Curative Intent, Discussed with Patient

## 2016-12-03 NOTE — Telephone Encounter (Signed)
Oncology Nurse Navigator Documentation  Following discussions with [facility] IR, spoke with patient's nurse Dennison.  Informed her of 12/15/16 1230 appt [facility] for [facility] placement.  Explained NPO status beginning 0800.  Explained BID Lovenox he is receiving per our earlier verification, should be held after HS dose 10/28.  If [facility] MD needs further clarification, provide [facility] IR phone number per Tiffany.  Later spoke with [facility] transportation coordinator, Helene Kelp, confirmed pt's 10/22 1000 Chemo Ed appt, 10/30 [facility] appt.  She voiced understanding.  [name], RN, BSN, [place] Neck Oncology Nurse [place] at [place] [phone]

## 2016-12-04 ENCOUNTER — Telehealth: Payer: Self-pay

## 2016-12-04 NOTE — Telephone Encounter (Signed)
Spoke with patient concerning upcoming appointment. Also left messages with the Helene Kelp about appointments.

## 2016-12-04 NOTE — Telephone Encounter (Signed)
Called and left a message of the upcoming appointment for [facility] with address and contact number of location for Helene Kelp the on site resident (transportation). Per 10/19 follow up.

## 2016-12-07 ENCOUNTER — Other Ambulatory Visit: Payer: Medicaid Other

## 2016-12-09 NOTE — Progress Notes (Signed)
[facility] New Visit:  Assessment: Squamous cell cancer of retromolar trigone ([place]) 56 y.o. 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  Based on the clinical features of his malignancy, the patient isn't a very high risk of local and distal recurrence of his malignancy. With that in mind, I agree with recommendation by Dr. Isidore Moos to consider adjuvant chemoradiotherapy.  Based on the patient's current performance status, patient should be considered for intense systemic chemotherapy such as high-dose cisplatin if he has no limiting factor such as preceding hearing loss.  Plan: --Consult IR for Port/PEGT --Audiology consultation for baseline hearing test --Patient needs to undergo dental extractions with a period of healing subsequently. This will delay initiation of adjuvant therapy, but it is medically necessary.  Return to clinic in 3 weeks with labs, clinic, and possible initiation of systemic chemotherapy.  Voice recognition software was used and creation of this note. Despite my best effort at editing the text, some misspelling/errors may have occurred.  Orders Placed This Encounter  Procedures  . IR FLUORO GUIDE PORT INSERTION LEFT    Standing Status:   Future    Standing Expiration Date:   02/02/2018    Order Specific Question:   Reason for Exam (SYMPTOM  OR DIAGNOSIS REQUIRED)    Answer:   [place]us[place] for systemic chemotherapy    Order Specific Question:   Preferred Imaging Location?    Answer:   [facility]  . US Abdomen Complete    Standing Status:   Future    Standing Expiration Date:   12/03/2017    Order Specific Question:   Reason for Exam (SYMPTOM  OR DIAGNOSIS REQUIRED)    Answer:   Severe transaminitis, please eval liver abnormalities    Order Specific Question:    Preferred imaging location?    Answer:   [facility]  . CBC with Differential    Standing Status:   Future    Number of Occurrences:   1    Standing Expiration Date:   12/03/2017  . Comprehensive metabolic panel    Standing Status:   Future    Number of Occurrences:   1    Standing Expiration Date:   12/03/2017  . Ambulatory referral to Audiology    Referral Priority:   Routine    Referral Type:   Audiology Exam    Referral Reason:   Specialty Services Required    Number of Visits Requested:   1    All questions were answered.  . The patient knows to call the clinic with any problems, questions or concerns.  This note was electronically signed.    History of Presenting Illness Lawrence Arias 56 y.o. presenting to the [facility] for consideration for adjuvant chemoradiotherapy for the diagnosis of squamous cell carcinoma over the left retromolar trigone.   Since the diagnosis in July of this year, patient underwent surgery on 10/16/16 including graft tissue placement. Recovering well from the operation, and he is referred to medical oncology for possible systemic chemotherapy as an adjunctive for the adjuvant radiation therapy. His primary ENT is Dr. Janace Hoard and Radiation Oncologist is Dr Isidore Moos.  Please see details of the clinical course sofar  below in the oncological history.  At this time, patient denies any fever, chills, night sweats. Denies chest pain, shortness of breath, or cough. Continues to  have moderate pain in the head and neck area which is well-controlled with her current medication. Denies any dysphagia, odynophagia, nausea, vomiting, abdominal pain, diarrhea, or constipation. No dysuria or hematuria. No sensory deficit or weakness in the extremities or face at this time.   Oncological/hematological History:   Squamous cell cancer of retromolar trigone ([place])   07/24/2016 Initial Diagnosis    Squamous cell cancer of retromolar trigone [facility]): Initial  biopsy demonstrating moderately differentiated invasive squamous cell carcinoma.       08/14/2016 Imaging    CT neck: Nonvisualization of left retromolar trigone mucosal lesion. No evidence of invasion into the adjacent structures. 2. Left level IIa lymph node measuring 1 cm, likely indicating spread of disease. No contralateral adenopathy.      09/29/2016 Imaging    CT Neck [facility]): Left-sided pathological lymphadenopathy of metastatic disease at level IB, IIA. Possible involvement of level IA, level III, and level IV on the left side.      10/16/2016 Pathology Results    Surgical Pathology:  LEFT TONSIL, SOFT PALATE, FLOOR OF MOUTH, BASE OF TONGUE, AND MANDIBLE, PARTIAL GLOSSOPHARYNGECTOMY AND PARTIAL MANDIBULECTOMY: Invasive squamous cell carcinoma, keratinizing type, moderately differentiated, is identified forming a 3.7x1.8x1.0cm mass in the left retromolar area.Tumor invades to a depth of 0.8 cm.Lymphovascular invasion is identified. Perineural invasion is identified. The medial soft tissue margin is positive for invasive squamous cell carcinoma (focal). LEFT PARAPHARYNGEAL MASS, EXCISION: Invasive squamous cell carcinoma forming a 1.2x1.2x0.5cm mass. Invasive carcinoma involves the unoriented soft tissue margin over a linear length of 0.3 cm. TEETH, REMOVAL: Two teeth (gross examination only).  Margins: "TUMOR NEXT TO BONE", EXCISION: Scant fragments of bone and dense fibrous tissue, negative for carcinoma. "PHARYNGEAL WALL MARGIN", EXCISION: Squamous mucosa with acute inflammation and skeletal muscle, negative for carcinoma. "HARD PALATE MARGIN", EXCISION: Squamous mucosa, negative for carcinoma. "TONGUE BASE MARGIN", EXCISION: Squamous mucosa with chronic inflammation, negative for carcinoma. "INFERIOR BUCCAL MUCOSA MARGIN", EXCISION: Squamous mucosa and skeletal muscle, negative for carcinoma. "SUPERIOR BUCCAL MUCOSAL MARGIN", EXCISION: Squamous mucosa with chronic inflammation,  negative for carcinoma.  Lymph nodes:  LEVEL 1A NECK CONTENTS, LYMPH NODE DISSECTION: Three lymph nodes, negative for metastatic carcinoma (0/3). LEFT LEVEL 1B NECK CONTENTS, LYMPH NODE DISSECTION: Metastatic carcinoma in two of five lymph nodes (2/5). Largest metastatic deposit measures 2.4 cm. Definitive extranodal extension is not identified. Salivary gland parenchyma, negative for tumor. LEFT LEVEL 2B NECK CONTENTS, LYMPH NODE DISSECTION: Eight lymph nodes, negative for metastatic carcinoma (0/8). LEFT LEVEL 2A, 3, 4, AND 5 NECK CONTENTS, LYMPH NODE DISSECTION: Metastatic carcinoma in one of twenty-two lymph nodes (1/22). Largest metastatic deposit measures 3.5 cm. Extranodal extension is present and measures 1.5 mm beyond the lymph node capsule.  COMMENT: Immunohistochemical staining for p16 (Block A4) shows the invasive carcinoma is diffusely positive for p16.The positive control stains appropriately.      10/16/2016 Surgery    Dr Harriett Rush Sunny Schlein [place]  [facility]): LEFT TONSIL, SOFT PALATE, FLOOR OF MOUTH, BASE OF TONGUE, AND MANDIBLE, PARTIAL GLOSSOPHARYNGECTOMY AND PARTIAL MANDIBULECTOMY, LEFT PARAPHARYNGEAL MASS, EXCISION, TEETH, REMOVAL       11/04/2016 Imaging    CT Neck [facility]): No pathological lymphadenopathy. Surgical changes noted. Significant fluid collection in the left upper neck with internal gas and significant soft tissue swelling.      11/30/2016 Imaging    CT Neck: Postop resection of retromolar trigone carcinoma on the left with free flap. Postsurgical changes left neck without evidence of recurrent tumor.  CT Chest: No suspicious pulmonary nodules or masses identified. No thoracic adenopathy.       Medical History: Past Medical History:  Diagnosis Date  . Cancer ([place])   . GERD (gastroesophageal reflux disease)    uses OTC prn  . Hypertension   . Tongue lesion     Surgical History: Past Surgical History:  Procedure Laterality Date  . DIRECT  LARYNGOSCOPY N/A 07/24/2016   Procedure: DIRECT LARYNGOSCOPY;  Surgeon: Melissa Montane, MD;  Location: [facility];  Service: ENT;  Laterality: N/A;  Direct laryngoscopy with biopsy tongue lesion  . FRACTURE SURGERY     left wrist, remote  . FREE FLAP RADIAL FOREARM Left 10/16/2016   Dr. Hendricks [facility]imes [facility]  . free flap scapular Left 10/16/2016   Dr. Hendricks Limes - [facility] hospital  . GASTROSTOMY  10/27/2016   Dr. Daphene Calamity- [facility] hospital.   . NECK DISSECTION Bilateral 10/16/2016   Selective neck dissection, Dr. Hendricks Limes [facility]  . 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 Hendricks Limes [facility]  . SKIN GRAFT Left 10/16/2016   Split thickness skin graft, leg Dr. Hendricks Limes. [facility]  . TONGUE BIOPSY  07/24/2016   Procedure: TONGUE BIOPSY;  Surgeon: Melissa Montane, MD;  Location: [facility];  Service: ENT;;  . TRACHEOSTOMY  10/16/2016   Dr. Hendricks Limes [facility]    Family History: Family History  Problem Relation Age of Onset  . Hypertension Mother   . Hypertension Father     Social History: Social History   Social History  . Marital status: Single    Spouse name: N/A  . Number of children: 3  . Years of education: N/A   Occupational History  . Not on file.   Social History Main Topics  . Smoking status: Current Some Day Smoker    Packs/day: 0.50    Types: Cigarettes  . Smokeless tobacco: Never Used     Comment: he is smoking 0ne every other day (11/20/16)  . Alcohol use No  . Drug use: No  . Sexual activity: Not on file   Other Topics Concern  . Not on file   Social History Narrative  . No narrative on file    Allergies: No Known Allergies  Medications:  Current Outpatient Prescriptions  Medication Sig Dispense Refill  . acetaminophen (TYLENOL) 500 MG tablet Take 1,000 mg by mouth every 6 (six) hours as needed for moderate pain.    Marland Kitchen amLODipine (NORVASC) 5 MG tablet Take 5 mg by mouth daily.  0  . buPROPion  (WELLBUTRIN SR) 150 MG 12 hr tablet Start one week before quit date. Take 1 tab daily x 3 days, then 1 tab BID thereafter. 60 tablet 2  . gabapentin (NEURONTIN) 300 MG capsule Take 1 capsule (300 mg total) by mouth 3 (three) times daily. To address cancer related pain. 90 capsule 0  . lidocaine (XYLOCAINE) 2 % solution Use as directed 20 mLs in the mouth or throat as needed for mouth pain. 100 mL 0  . nicotine (NICODERM CQ - DOSED IN MG/24 HOURS) 14 mg/24hr patch Place 1 patch (14 mg total) onto the skin daily. Apply 21 mg patch daily x 6 wk, then '14mg'$  patch daily x 2 wk, then 7 mg patch daily x 2 wk 14 patch 0  . nicotine (NICODERM CQ - DOSED IN MG/24 HOURS) 21 mg/24hr patch Place 1 patch (21 mg total) onto the skin daily. Apply 21 mg  patch daily x 6 wk, then '14mg'$  patch daily x 2 wk, then 7 mg patch daily x 2 wk 14 patch 2  . nicotine (NICODERM CQ - DOSED IN MG/24 HR) 7 mg/24hr patch Place 1 patch (7 mg total) onto the skin daily. Apply 21 mg patch daily x 6 wk, then '14mg'$  patch daily x 2 wk, then 7 mg patch daily x 2 wk 14 patch 0  . oxyCODONE-acetaminophen (PERCOCET/ROXICET) 5-325 MG tablet Take 1-2 tablets by mouth every 6 (six) hours as needed. (Patient not taking: Reported on 12/03/2016) 20 tablet 0   No current facility-administered medications for this visit.     Review of Systems: Review of Systems  All other systems reviewed and are negative.    PHYSICAL EXAMINATION Blood pressure (!) 148/108, pulse 95, temperature 98.4 F (36.9 C), temperature source Oral, resp. rate 18, height '5\' 7"'$  (1.702 m), weight 131 lb 9.6 oz (59.7 kg), SpO2 100 %.  ECOG PERFORMANCE STATUS: 2 - Symptomatic, <50% confined to bed  Physical Exam  Constitutional: He is oriented to person, place, and time and well-developed, well-nourished, and in no distress. No distress.  HENT:  Surgical incisions are well-healed. Left buccal mucosal surface replaced with a graft tissue. No obvious visible cancer.  Eyes:  Pupils are equal, round, and reactive to light. EOM are normal. No scleral icterus.  Neck: Normal range of motion. Neck supple. No thyromegaly present.  Cardiovascular: Normal rate, regular rhythm and intact distal pulses.   No murmur heard. Pulmonary/Chest: Effort normal and breath sounds normal. No respiratory distress. He has no wheezes. He has no rales.  Abdominal: Soft. Bowel sounds are normal. He exhibits no distension and no mass. There is no tenderness. There is no rebound and no guarding.  Musculoskeletal: Normal range of motion. He exhibits no edema.  Lymphadenopathy:    He has no cervical adenopathy.  Neurological: He is alert and oriented to person, place, and time. He has normal reflexes. No cranial nerve deficit. Coordination normal.  Skin: Skin is dry. No rash noted. He is not diaphoretic. No erythema. No pallor.     LABORATORY DATA: I have personally reviewed the data as listed: Appointment on 12/03/2016  Component Date Value Ref Range Status  . WBC 12/03/2016 4.5  4.0 - 10.3 10e3/uL Final  . NEUT# 12/03/2016 2.1  1.5 - 6.5 10e3/uL Final  . HGB 12/03/2016 10.1* 13.0 - 17.1 g/dL Final  . HCT 12/03/2016 30.1* 38.4 - 49.9 % Final  . Platelets 12/03/2016 262  140 - 400 10e3/uL Final  . MCV 12/03/2016 100.5* 79.3 - 98.0 fL Final  . MCH 12/03/2016 33.8* 27.2 - 33.4 pg Final  . MCHC 12/03/2016 33.7  32.0 - 36.0 g/dL Final  . RBC 12/03/2016 2.99* 4.20 - 5.82 10e6/uL Final  . RDW 12/03/2016 18.0* 11.0 - 14.6 % Final  . lymph# 12/03/2016 1.7  0.9 - 3.3 10e3/uL Final  . MONO# 12/03/2016 0.4  0.1 - 0.9 10e3/uL Final  . Eosinophils Absolute 12/03/2016 0.3  0.0 - 0.5 10e3/uL Final  . Basophils Absolute 12/03/2016 0.1  0.0 - 0.1 10e3/uL Final  . NEUT% 12/03/2016 46.4  39.0 - 75.0 % Final  . LYMPH% 12/03/2016 36.3  14.0 - 49.0 % Final  . MONO% 12/03/2016 9.7  0.0 - 14.0 % Final  . EOS% 12/03/2016 5.9  0.0 - 7.0 % Final  . BASO% 12/03/2016 1.7  0.0 - 2.0 % Final  . Sodium  12/03/2016 140  136 - 145 mEq/L  Final  . Potassium 12/03/2016 4.1  3.5 - 5.1 mEq/L Final  . Chloride 12/03/2016 103  98 - 109 mEq/L Final  . CO2 12/03/2016 27  22 - 29 mEq/L Final  . Glucose 12/03/2016 96  70 - 140 mg/dl Final   Glucose reference range is for nonfasting patients. Fasting glucose reference range is 70- 100.  Marland Kitchen BUN 12/03/2016 11.2  7.0 - 26.0 mg/dL Final  . Creatinine 12/03/2016 0.8  0.7 - 1.3 mg/dL Final  . Total Bilirubin 12/03/2016 0.24  0.20 - 1.20 mg/dL Final  . Alkaline Phosphatase 12/03/2016 105  40 - 150 U/L Final  . AST 12/03/2016 25  5 - 34 U/L Final  . ALT 12/03/2016 37  0 - 55 U/L Final  . Total Protein 12/03/2016 8.0  6.4 - 8.3 g/dL Final  . Albumin 12/03/2016 3.8  3.5 - 5.0 g/dL Final  . Calcium 12/03/2016 9.7  8.4 - 10.4 mg/dL Final  . Anion Gap 12/03/2016 10  3 - 11 mEq/L Final  . EGFR 12/03/2016 >60  >60 ml/min/1.73 m2 Final   eGFR is calculated using the CKD-EPI Creatinine Equation (2009)        Ardath Sax, MD

## 2016-12-10 ENCOUNTER — Telehealth (HOSPITAL_COMMUNITY): Payer: Self-pay | Admitting: Dentistry

## 2016-12-10 ENCOUNTER — Encounter: Payer: Self-pay | Admitting: Hematology and Oncology

## 2016-12-10 NOTE — Assessment & Plan Note (Signed)
56 y.o. 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  Based on the clinical features of his malignancy, the patient isn't a very high risk of local and distal recurrence of his malignancy. With that in mind, I agree with recommendation by Dr. Isidore Moos to consider adjuvant chemoradiotherapy.  Based on the patient's current performance status, patient should be considered for intense systemic chemotherapy such as high-dose cisplatin if he has no limiting factor such as preceding hearing loss.  Plan: --Consult IR for Port/PEGT --Audiology consultation for baseline hearing test --Patient needs to undergo dental extractions with a period of healing subsequently. This will delay initiation of adjuvant therapy, but it is medically necessary.

## 2016-12-10 NOTE — Telephone Encounter (Signed)
12/10/2016  Patient:            Lawrence Arias Date of Birth:  1960/12/01 MRN:                [id number]   I called Dr. Raynelle Dick office and spoke with Vaughan Basta. The patient had extraction of tooth numbers 10, 19, 23 through 26, 31, and 32 with alveoloplasty on 12/09/2016. No postoperative appointment is scheduled at this time. The patient should be ready for start of radiation therapy on 12/23/2016. The head and neck navigator, [name], is to contact Dr. Raynelle Dick office to confirm clearance date for start of radiation therapy.  Lenn Cal, DDS

## 2016-12-11 ENCOUNTER — Telehealth: Payer: Self-pay | Admitting: *Deleted

## 2016-12-11 NOTE — Progress Notes (Signed)
error 

## 2016-12-11 NOTE — Telephone Encounter (Signed)
Oncology Nurse Navigator Documentation  Spoke with [facility] [employer] coordinator [name] re next Monday's 0900 [place] and [id number] [facility] placement with arrival for both appts to [facility]iology.  I reviewed NPO requirement for both procedures which she will communicate to RN.  She voiced understanding of information.  [name], RN, BSN, [place] Neck Oncology Nurse [place] at [place] [phone]

## 2016-12-14 ENCOUNTER — Ambulatory Visit (HOSPITAL_COMMUNITY)
Admission: RE | Admit: 2016-12-14 | Discharge: 2016-12-14 | Disposition: A | Discharge status: Home / Self Care | Payer: Medicaid Other | Source: Ambulatory Visit | Attending: Hematology and Oncology | Admitting: Hematology and Oncology

## 2016-12-14 ENCOUNTER — Other Ambulatory Visit: Payer: Self-pay | Admitting: Radiology

## 2016-12-14 DIAGNOSIS — R740 Nonspecific elevation of levels of transaminase and lactic acid dehydrogenase [LDH]: Secondary | ICD-10-CM | POA: Insufficient documentation

## 2016-12-14 DIAGNOSIS — R7401 Elevation of levels of liver transaminase levels: Secondary | ICD-10-CM

## 2016-12-15 ENCOUNTER — Ambulatory Visit (HOSPITAL_COMMUNITY)
Admission: RE | Admit: 2016-12-15 | Discharge: 2016-12-15 | Disposition: A | Discharge status: Home / Self Care | Payer: Medicaid Other | Source: Ambulatory Visit | Attending: Hematology and Oncology | Admitting: Hematology and Oncology

## 2016-12-15 DIAGNOSIS — C062 Malignant neoplasm of retromolar area: Secondary | ICD-10-CM

## 2016-12-15 MED ORDER — CEFAZOLIN SODIUM-DEXTROSE 2-4 GM/100ML-% IV SOLN: 2.0000 g | Freq: Once | INTRAVENOUS | Status: DC

## 2016-12-15 MED ORDER — SODIUM CHLORIDE 0.9 % IV SOLN: INTRAVENOUS | Status: DC

## 2016-12-15 NOTE — Progress Notes (Signed)
Patient ID: Lawrence Arias, male   DOB: February 16, 1961, 56 y.o.   MRN: [id number] Patient presented to hospital today for Port-A-Cath placement. Upon questioning patient stated that he had coffee with cream and sugar this morning at 11:45. Due to need for IV conscious sedation port-a cath placement has been rescheduled for 12/22/16. Preprocedure instructions given to pt.

## 2016-12-15 NOTE — Progress Notes (Signed)
Patient's port a cath cancelled due to drinking coffee. Rescheduled and patient given papers which state when to stop Lovenox and when to stop food and drink prior to coming back for rescheduled appt. Called Hoyle Sauer at [facility] where patient currently is a resident and informed of above. Patient and [place] both verbalized understanding of upcoming appt and directions. Called Marjean Donna (transportation for [employer]) at [phone] and informed her patient needed to be picked up. Patient received no meds/sedation while here as procedure cancelled. Lorainne stated she will come pick him up and she has patient's phone number (and he has hers) and he will be waiting for transportation at [facility]. Offered for patient to wait in his room in Short Stay but he wanted to wait downstairs.

## 2016-12-16 ENCOUNTER — Ambulatory Visit: Payer: Medicaid Other | Admitting: Radiation Oncology

## 2016-12-16 ENCOUNTER — Ambulatory Visit: Admission: RE | Admit: 2016-12-16 | Payer: Medicaid Other | Source: Ambulatory Visit | Admitting: Radiation Oncology

## 2016-12-16 ENCOUNTER — Ambulatory Visit
Admission: RE | Admit: 2016-12-16 | Discharge: 2016-12-16 | Disposition: A | Discharge status: Home / Self Care | Payer: Medicaid Other | Source: Ambulatory Visit | Attending: Radiation Oncology | Admitting: Radiation Oncology

## 2016-12-16 ENCOUNTER — Telehealth: Payer: Self-pay | Admitting: *Deleted

## 2016-12-16 NOTE — Telephone Encounter (Signed)
Oncology Nurse Navigator Documentation  Spoke with [facility] [employer] coordinator [name] re arrival time for next week's appts:   11/5 1130 [place], 11/6 1230 [facility], 11/9 [place].  She voiced understanding.  [name], RN, BSN, [place] Neck Oncology Nurse [place] at [place] [phone]

## 2016-12-17 NOTE — Progress Notes (Signed)
error 

## 2016-12-18 ENCOUNTER — Telehealth: Payer: Self-pay | Admitting: *Deleted

## 2016-12-18 NOTE — Telephone Encounter (Signed)
Oncology Nurse Navigator Documentation  Called [facility], spoke with patient's nurse Vonna Kotyk, reminded Lovenox to be held after 11/4 HS dose in preparation for 11/6 PAC placement.  He confirmed orders already placed.  [name], RN, BSN, [place] Neck Oncology Nurse [place] at [place] [phone]

## 2016-12-21 ENCOUNTER — Ambulatory Visit
Admission: RE | Admit: 2016-12-21 | Discharge: 2016-12-21 | Disposition: A | Discharge status: Home / Self Care | Payer: Medicaid Other | Source: Ambulatory Visit | Attending: Radiation Oncology | Admitting: Radiation Oncology

## 2016-12-21 ENCOUNTER — Telehealth: Payer: Self-pay | Admitting: *Deleted

## 2016-12-21 ENCOUNTER — Ambulatory Visit: Payer: Medicaid Other | Attending: Radiation Oncology

## 2016-12-21 ENCOUNTER — Encounter: Payer: Self-pay | Admitting: *Deleted

## 2016-12-21 ENCOUNTER — Other Ambulatory Visit: Payer: Self-pay | Admitting: Student

## 2016-12-21 VITALS — BP 149/105 | HR 104 | Temp 98.1°F | Wt 138.0 lb

## 2016-12-21 DIAGNOSIS — C062 Malignant neoplasm of retromolar area: Secondary | ICD-10-CM

## 2016-12-21 DIAGNOSIS — R1310 Dysphagia, unspecified: Secondary | ICD-10-CM

## 2016-12-21 MED ORDER — SODIUM CHLORIDE 0.9% FLUSH
10.0000 mL | Freq: Once | INTRAVENOUS | Status: AC
  Administered 2016-12-21: 10 mL via INTRAVENOUS

## 2016-12-21 NOTE — Progress Notes (Signed)
Oncology Nurse Navigator Documentation  Met with Lawrence Arias during CT SIM. He tolerated procedure without difficulty, denied questions/concerns. Showed him [facility] 3 tmt area, explained registration, arrival and preparation procedures.  He voiced understanding. Provided him 2 copies of Epic calendar of upcoming appts, one of which I asked him to deliver to [name], Copy.  He voiced understanding. We discussed his 1200 arrival tomorrow to [facility] Radiology for [facility] placement, NPO status beginning midnight.  He confirmed he has not received Lovenox since Sunday.  [name], RN, BSN, [place] Neck Oncology Nurse [place] at [place] [phone]

## 2016-12-21 NOTE — Telephone Encounter (Signed)
Oncology Nurse Navigator Documentation  Sent fax to [facility] Audiology requesting baseline audiology, requested call/email when appt scheduled.  Provided guidance to speak with Helene Kelp at [facility] to arrange transportation for appt.  'Successful TX Notice' received.  [name], RN, BSN, [place] Neck Oncology Nurse [place] at [place] [phone]

## 2016-12-21 NOTE — Therapy (Signed)
[facility] [address] [place], [place], [address] Phone: [phone]   Fax:  [phone]  Patient Details  Name: Lawrence Arias MRN: [id number] Date of Birth: [birth year] Referring Provider:  Eppie Gibson, MD  Encounter Date: 12/21/2016  ST Arrived, no charge/cancel Pt arrived, however he resides in an assisted living facility and per pt report has rec'd ST there in the past 4 weeks. Facility will need to provide written documentation that services can be provided by [facility], and demo awareness that pt's residential facility will be billed.  Pt's primary SLP at pt's residential facility can also consult this SLP for assistance, if desired.  This information provided to Wynona Meals, Head and Neck RN navigator.   [facility] ,[place], [place]  12/21/2016, 3:00 PM  [facility] [facility] [address] [place], [place], [address] Phone: [phone]   Fax:  [phone]

## 2016-12-21 NOTE — Progress Notes (Addendum)
Head and Neck Cancer Simulation, IMRT treatment planning, and Special treatment procedure note   Outpatient  Diagnosis:    ICD-10-CM   1. Cancer of retromolar area [facility]) C06.2     The patient was taken to the CT simulator and laid in the supine position on the table. An Aquaplast head and shoulder mask was custom fitted to the patient's anatomy. High-resolution CT axial imaging was obtained of the head and neck with contrast. I verified that the quality of the imaging is good for treatment planning. 1 Medically Necessary Treatment Device was fabricated and supervised by me: Aquaplast mask.   Treatment planning note I plan to treat the patient with IMRT. I plan to treat the patient's tumor and bilateral neck nodes. I plan to treat to a total dose of 66 [place] in 33  fractions. Dose calculation was ordered from dosimetry.  IMRT planning Note  IMRT is medically necessary and an important modality to deliver adequate dose to the patient's at risk tissues while sparing the patient's normal structures, including the: esophagus, parotid tissue, mandible, brain stem, spinal cord, oral cavity, brachial plexus.  This justifies the use of IMRT in the patient's treatment.   Special Treatment Procedure Note:  The patient will be receiving chemotherapy concurrently. Chemotherapy heightens the risk of side effects. I have considered this during the patient's treatment planning process and will monitor the patient accordingly for side effects on a weekly basis. Concurrent chemotherapy increases the complexity of this patient's treatment and therefore this constitutes a special treatment procedure.  -----------------------------------  Eppie Gibson, MD

## 2016-12-22 ENCOUNTER — Other Ambulatory Visit: Payer: Self-pay | Admitting: Hematology and Oncology

## 2016-12-22 ENCOUNTER — Ambulatory Visit (HOSPITAL_COMMUNITY)
Admission: RE | Admit: 2016-12-22 | Discharge: 2016-12-22 | Disposition: A | Discharge status: Home / Self Care | Payer: Medicaid Other | Source: Ambulatory Visit | Attending: Hematology and Oncology | Admitting: Hematology and Oncology

## 2016-12-22 ENCOUNTER — Encounter (HOSPITAL_COMMUNITY): Payer: Self-pay

## 2016-12-22 DIAGNOSIS — Z79899 Other long term (current) drug therapy: Secondary | ICD-10-CM | POA: Diagnosis not present

## 2016-12-22 DIAGNOSIS — K219 Gastro-esophageal reflux disease without esophagitis: Secondary | ICD-10-CM | POA: Insufficient documentation

## 2016-12-22 DIAGNOSIS — C062 Malignant neoplasm of retromolar area: Secondary | ICD-10-CM | POA: Insufficient documentation

## 2016-12-22 DIAGNOSIS — F17210 Nicotine dependence, cigarettes, uncomplicated: Secondary | ICD-10-CM | POA: Insufficient documentation

## 2016-12-22 DIAGNOSIS — C760 Malignant neoplasm of head, face and neck: Secondary | ICD-10-CM | POA: Diagnosis present

## 2016-12-22 DIAGNOSIS — Z930 Tracheostomy status: Secondary | ICD-10-CM | POA: Insufficient documentation

## 2016-12-22 HISTORY — PX: IR US GUIDE VASC ACCESS RIGHT: IMG2390

## 2016-12-22 HISTORY — PX: IR FLUORO GUIDE PORT INSERTION RIGHT: IMG5741

## 2016-12-22 LAB — CBC
HCT: 33.6 % — ABNORMAL LOW (ref 39.0–52.0)
HEMOGLOBIN: 11.5 g/dL — AB (ref 13.0–17.0)
MCH: 33.3 pg (ref 26.0–34.0)
MCHC: 34.2 g/dL (ref 30.0–36.0)
MCV: 97.4 fL (ref 78.0–100.0)
Platelets: 421 10*3/uL — ABNORMAL HIGH (ref 150–400)
RBC: 3.45 MIL/uL — AB (ref 4.22–5.81)
RDW: 15.9 % — ABNORMAL HIGH (ref 11.5–15.5)
WBC: 5.9 10*3/uL (ref 4.0–10.5)

## 2016-12-22 LAB — PROTIME-INR
INR: 1.27
PROTHROMBIN TIME: 15.8 s — AB (ref 11.4–15.2)

## 2016-12-22 MED ORDER — CEFAZOLIN SODIUM-DEXTROSE 2-4 GM/100ML-% IV SOLN
2.0000 g | INTRAVENOUS | Status: AC
  Administered 2016-12-22: 2 g via INTRAVENOUS

## 2016-12-22 MED ORDER — HEPARIN SOD (PORK) LOCK FLUSH 100 UNIT/ML IV SOLN
INTRAVENOUS | Status: AC
  Filled 2016-12-22: qty 5

## 2016-12-22 MED ORDER — MIDAZOLAM HCL 2 MG/2ML IJ SOLN
INTRAMUSCULAR | Status: AC
  Filled 2016-12-22: qty 4

## 2016-12-22 MED ORDER — LIDOCAINE HCL 1 % IJ SOLN
INTRAMUSCULAR | Status: AC
  Filled 2016-12-22: qty 20

## 2016-12-22 MED ORDER — LIDOCAINE HCL 1 % IJ SOLN
INTRAMUSCULAR | Status: AC | PRN
  Administered 2016-12-22: 10 mL

## 2016-12-22 MED ORDER — LIDOCAINE-EPINEPHRINE (PF) 2 %-1:200000 IJ SOLN
INTRAMUSCULAR | Status: AC | PRN
  Administered 2016-12-22: 10 mL

## 2016-12-22 MED ORDER — MIDAZOLAM HCL 2 MG/2ML IJ SOLN
INTRAMUSCULAR | Status: AC | PRN
  Administered 2016-12-22 (×2): 1 mg via INTRAVENOUS

## 2016-12-22 MED ORDER — LIDOCAINE-EPINEPHRINE (PF) 2 %-1:200000 IJ SOLN
INTRAMUSCULAR | Status: AC
  Filled 2016-12-22: qty 20

## 2016-12-22 MED ORDER — FENTANYL CITRATE (PF) 100 MCG/2ML IJ SOLN
INTRAMUSCULAR | Status: AC
  Filled 2016-12-22: qty 2

## 2016-12-22 MED ORDER — FENTANYL CITRATE (PF) 100 MCG/2ML IJ SOLN
INTRAMUSCULAR | Status: AC | PRN
  Administered 2016-12-22 (×2): 50 ug via INTRAVENOUS

## 2016-12-22 MED ORDER — SODIUM CHLORIDE 0.9 % IV SOLN
INTRAVENOUS | Status: DC
  Administered 2016-12-22: 13:00:00 via INTRAVENOUS

## 2016-12-22 MED ORDER — CEFAZOLIN SODIUM-DEXTROSE 2-4 GM/100ML-% IV SOLN
INTRAVENOUS | Status: AC
  Filled 2016-12-22: qty 100

## 2016-12-22 NOTE — Discharge Instructions (Signed)
Moderate Conscious Sedation, Adult, Care After These instructions provide you with information about caring for yourself after your procedure. Your health care provider may also give you more specific instructions. Your treatment has been planned according to current medical practices, but problems sometimes occur. Call your health care provider if you have any problems or questions after your procedure. What can I expect after the procedure? After your procedure, it is common:  To feel sleepy for several hours.  To feel clumsy and have poor balance for several hours.  To have poor judgment for several hours.  To vomit if you eat too soon.  Follow these instructions at home: For at least 24 hours after the procedure:   Do not: ? Participate in activities where you could fall or become injured. ? Drive. ? Use heavy machinery. ? Drink alcohol. ? Take sleeping pills or medicines that cause drowsiness. ? Make important decisions or sign legal documents. ? Take care of children on your own.  Rest. Eating and drinking  Follow the diet recommended by your health care provider.  If you vomit: ? Drink water, juice, or soup when you can drink without vomiting. ? Make sure you have little or no nausea before eating solid foods. General instructions  Have a responsible adult stay with you until you are awake and alert.  Take over-the-counter and prescription medicines only as told by your health care provider.  If you smoke, do not smoke without supervision.  Keep all follow-up visits as told by your health care provider. This is important. Contact a health care provider if:  You keep feeling nauseous or you keep vomiting.  You feel light-headed.  You develop a rash.  You have a fever. Get help right away if:  You have trouble breathing. This information is not intended to replace advice given to you by your health care provider. Make sure you discuss any questions you have  with your health care provider. Document Released: 11/23/2012 Document Revised: 07/08/2015 Document Reviewed: 05/25/2015 Elsevier Interactive Patient Education  2018 [place] Insertion, Care After This sheet gives you information about how to care for yourself after your procedure. Your health care provider may also give you more specific instructions. If you have problems or questions, contact your health care provider. What can I expect after the procedure? After your procedure, it is common to have:  Discomfort at the port insertion site.  Bruising on the skin over the port. This should improve over 3-4 days.  Follow these instructions at home: [facility] care  After your port is placed, you will get a manufacturer's information card. The card has information about your port. Keep this card with you at all times.  Take care of the port as told by your health care provider. Ask your health care provider if you or a family member can get training for taking care of the port at home. A home health care nurse may also take care of the port.  Make sure to remember what type of port you have. Incision care  Follow instructions from your health care provider about how to take care of your port insertion site. Make sure you: ? Wash your hands with soap and water before you change your bandage (dressing). If soap and water are not available, use hand sanitizer. ? Change your dressing as told by your health care provider.  Remove dressing tomorrow 12/23/16. ? Leave skin glue in place. These skin closures may need  to stay in place for 2 weeks or longer. If adhesive strip edges start to loosen and curl up, you may trim the loose edges.   DO NOT use EMLA cream for 2 weeks after port placement as this cream will remove surgical glue.  Check your port insertion site every day for signs of infection. Check for: ? More redness, swelling, or pain. ? More fluid or  blood. ? Warmth. ? Pus or a bad smell. General instructions  Do not take baths, swim, or use a hot tub until your health care provider approves.  Do not lift anything that is heavier than 10 lb (4.5 kg) for a week, or as told by your health care provider.  Ask your health care provider when it is okay to: ? Return to work or school. ? Resume usual physical activities or sports.  Do not drive for 24 hours if you were given a medicine to help you relax (sedative).  Take over-the-counter and prescription medicines only as told by your health care provider.  Wear a medical alert bracelet in case of an emergency. This will tell any health care providers that you have a port.  Keep all follow-up visits as told by your health care provider. This is important. Contact a health care provider if:  You have a fever or chills.  You have more redness, swelling, or pain around your port insertion site.  You have more fluid or blood coming from your port insertion site.  Your port insertion site feels warm to the touch.  You have pus or a bad smell coming from the port insertion site. Get help right away if:  You have chest pain or shortness of breath.  You have bleeding from your port that you cannot control. Summary  Take care of the port as told by your health care provider.  Change your dressing as told by your health care provider.  Keep all follow-up visits as told by your health care provider. This information is not intended to replace advice given to you by your health care provider. Make sure you discuss any questions you have with your health care provider. Document Released: 11/23/2012 Document Revised: 12/25/2015 Document Reviewed: 12/25/2015 Elsevier Interactive Patient Education  2017 [employer].

## 2016-12-22 NOTE — Procedures (Signed)
Interventional Radiology Procedure Note  Procedure: Placement of a right IJ approach single lumen PowerPort.  Tip is positioned at the superior cavoatrial junction and catheter is ready for immediate use.  Complications: No immediate Recommendations:  - Ok to shower tomorrow - Do not submerge for 7 days - Routine line care   Signed,  Tequisha Maahs K. Savita Runner, MD   

## 2016-12-22 NOTE — H&P (Signed)
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 He is to begin chemotherapy soon and is referred for port placement. PMHx, meds, labs, imaging reviewed. Has been NPO this am. Feels well, no recent fevers, chills, illness. Friend/significant other at bedside.  Past Medical History:  Diagnosis Date  . Cancer ([place])   . GERD (gastroesophageal reflux disease)    uses OTC prn  . Hypertension   . Tongue lesion     Past Surgical History:  Procedure Laterality Date  . FRACTURE SURGERY     left wrist, remote  . FREE FLAP RADIAL FOREARM Left 10/16/2016   [facility] Hendricks [facility]  . free flap scapular Left 10/16/2016   Dr. Hendricks Limes - [facility] hospital  . GASTROSTOMY  10/27/2016   Dr. Daphene Calamity- [facility] hospital.   . NECK DISSECTION Bilateral 10/16/2016   Selective neck dissection, Dr. Hendricks Limes [facility]  . 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 Hendricks Limes [facility]  . SKIN GRAFT Left 10/16/2016   Split thickness skin graft, leg Dr. Hendricks Limes. [facility]  . TRACHEOSTOMY  10/16/2016   Dr. Hendricks Limes [facility]    Allergies: Patient has no known allergies.  Medications: Prior to Admission medications   Medication Sig Start Date End Date Taking? Authorizing Provider  acetaminophen (TYLENOL) 500 MG tablet Take 1,000 mg by mouth every 6 (six) hours as needed for moderate pain.   Yes [provider]  amLODipine (NORVASC) 5 MG tablet Take 5 mg by mouth daily. 10/11/15   [provider]  buPROPion (WELLBUTRIN SR) 150 MG 12 hr tablet Start one week before quit date. Take 1 tab daily x 3 days, then 1 tab BID thereafter. 08/25/16   Eppie Gibson, MD  gabapentin (NEURONTIN) 300  MG capsule Take 1 capsule (300 mg total) by mouth 3 (three) times daily. To address cancer related pain. 08/25/16   Eppie Gibson, MD  lidocaine (XYLOCAINE) 2 % solution Use as directed 20 mLs in the mouth or throat as needed for mouth pain. 08/15/16   Carlisle Cater, PA-C  nicotine (NICODERM CQ - DOSED IN MG/24 HOURS) 14 mg/24hr patch Place 1 patch (14 mg total) onto the skin daily. Apply 21 mg patch daily x 6 wk, then 14mg  patch daily x 2 wk, then 7 mg patch daily x 2 wk 08/25/16   Eppie Gibson, MD  nicotine (NICODERM CQ - DOSED IN MG/24 HOURS) 21 mg/24hr patch Place 1 patch (21 mg total) onto the skin daily. Apply 21 mg patch daily x 6 wk, then 14mg  patch daily x 2 wk, then 7 mg patch daily x 2 wk 08/25/16   Eppie Gibson, MD  nicotine (NICODERM CQ - DOSED IN MG/24 HR) 7 mg/24hr patch Place 1 patch (7 mg total) onto the skin daily. Apply 21 mg patch daily x 6 wk, then 14mg  patch daily x 2 wk, then 7 mg patch daily x 2 wk 08/25/16   Eppie Gibson, MD  oxyCODONE-acetaminophen (PERCOCET/ROXICET) 5-325 MG tablet Take 1-2 tablets by mouth every 6 (six) hours as needed. Patient not taking: Reported on 12/03/2016 10/11/16   Ward, Delice Bison, DO     Family History  Problem Relation Age of Onset  . Hypertension Mother   .  Hypertension Father     Social History   Socioeconomic History  . Marital status: Single    Spouse name: None  . Number of children: 3  . Years of education: None  . Highest education level: None  Social Needs  . Financial resource strain: None  . Food insecurity - worry: None  . Food insecurity - inability: None  . Transportation needs - medical: None  . Transportation needs - non-medical: None  Occupational History  . None  Tobacco Use  . Smoking status: Current Some Day Smoker    Packs/day: 0.50    Types: Cigarettes  . Smokeless tobacco: Never Used  . Tobacco comment: he is smoking 0ne every other day (11/20/16)  Substance and Sexual Activity  . Alcohol use: No  . Drug  use: No  . Sexual activity: None  Other Topics Concern  . None  Social History Narrative  . None    Review of Systems: A 12 point ROS discussed and pertinent positives are indicated in the HPI above.  All other systems are negative.  Review of Systems  Vital Signs: BP (!) 139/98   Pulse (!) 105   Temp 98.5 F (36.9 C) (Oral)   Resp 17   SpO2 99%   Physical Exam  Constitutional: He is oriented to person, place, and time. He appears well-developed. No distress.  HENT:  Head: Normocephalic.  Mouth/Throat: Oropharynx is clear and moist.  Neck: Normal range of motion. No JVD present. No tracheal deviation present.  Cardiovascular: Normal rate, regular rhythm and normal heart sounds.  Pulmonary/Chest: Effort normal and breath sounds normal. No respiratory distress.  Neurological: He is alert and oriented to person, place, and time.  Skin: Skin is warm and dry.  Psychiatric: He has a normal mood and affect. Judgment normal.    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 CONTRAST:  56mL ISOVUE-300 IOPAMIDOL (ISOVUE-300) INJECTION 61% COMPARISON:  CT neck 08/14/2016 FINDINGS: Pharynx and larynx: Interval resection of retromolar trigone lesion on the left with free flap containing fat and soft tissue. Transit mandible approach on the left. Soft tissue thickening in the left neck related to lymph node dissection. This is the first postop CT and will serve as baseline for further studies. No evidence of recurrent tumor. Tracheostomy in satisfactory position. Salivary glands: Left submandibular gland resected. Right submandibular gland normal. Parotid gland normal bilaterally. Thyroid: Normal Lymph  nodes: Negative for malignant adenopathy. Vascular: Left jugular vein apparently resected. A right jugular vein patent. Both carotid arteries patent with mild atherosclerotic disease. Limited intracranial: Negative Visualized orbits: Negative Mastoids and visualized paranasal sinuses: Negative Skeleton: Osteotomy and repair of left mandible for surgical approach. Negative for bony lesion. Upper chest: CT chest from today reported separately. Other: None IMPRESSION: Postop resection of retromolar trigone carcinoma on the left with free flap. Postsurgical changes left neck without evidence of recurrent tumor. This will serve as a baseline for future imaging studies. Electronically Signed   By: Franchot Gallo M.D.   On: 11/30/2016 15:55   Ct Chest W Contrast  Result Date: 11/30/2016 CLINICAL DATA:  Oral cavity cancer.  Status post therapy. EXAM: CT CHEST WITH CONTRAST TECHNIQUE: Multidetector CT imaging of the chest was performed during intravenous contrast administration.  CONTRAST:  16mL ISOVUE-300 IOPAMIDOL (ISOVUE-300) INJECTION 61% COMPARISON:  None. FINDINGS: Cardiovascular: Normal heart size. No pericardial effusion. Mild aortic atherosclerosis. No aneurysm. Calcification in the LAD coronary artery noted. Mediastinum/Nodes: The trachea appears patent and is midline. Unremarkable appearance of the esophagus. No mediastinal or hilar adenopathy. Small axillary lymph nodes are identified bilaterally. Surgical clips within the left supraclavicular area identified. Lungs/Pleura: No pleural effusion. Moderate changes of centrilobular emphysema. No suspicious pulmonary nodules or masses identified. Upper Abdomen: No acute abnormality. Musculoskeletal: Chronic right posterior rib deformities. There is a postsurgical deformity involving the left scapula with surrounding callus formation. This is new since 09/15/2015. IMPRESSION: 1. No suspicious pulmonary nodules or masses identified. No thoracic adenopathy. 2.  Postsurgical changes involving the left scapula identified. Etiology indeterminate. Has there been a resected metastasis from the left scapula? Electronically Signed   By: Kerby Moors M.D.   On: 11/30/2016 16:31   US Abdomen Complete  Result Date: 12/14/2016 CLINICAL DATA:  Transaminitis EXAM: ABDOMEN ULTRASOUND COMPLETE COMPARISON:  11/11/2009 FINDINGS: Gallbladder: No gallstones or wall thickening visualized. No sonographic Murphy sign noted by sonographer. Common bile duct: Diameter: Upper limits normal, 6.8 mm. Liver: No focal lesion identified. Within normal limits in parenchymal echogenicity. Portal vein is patent on color Doppler imaging with normal direction of blood flow towards the liver. IVC: No abnormality visualized. Pancreas: Visualized portion unremarkable. Spleen: Size and appearance within normal limits. Right Kidney: Length: 10.3 cm. Echogenicity within normal limits. No mass or hydronephrosis visualized. Left Kidney: Length: 10.8 cm. Echogenicity within normal limits. No mass or hydronephrosis visualized. Abdominal aorta: No aneurysm visualized. Other findings: None. IMPRESSION: Unremarkable abdominal ultrasound. Electronically Signed   By: Rolm Baptise M.D.   On: 12/14/2016 09:39    Labs:  CBC: Recent Labs    05/21/16 1811 07/24/16 0839 12/03/16 1119 12/22/16 1311  WBC 3.9*  --  4.5 5.9  HGB 13.2 10.8* 10.1* 11.5*  HCT 35.9*  --  30.1* 33.6*  PLT 275  --  262 421*    COAGS: Recent Labs    12/22/16 1311  INR 1.27    BMP: Recent Labs    05/21/16 1811 12/03/16 1119  NA 129* 140  K 5.2* 4.1  CL 93*  --   CO2 19* 27  GLUCOSE 87 96  BUN 12 11.2  CALCIUM 9.5 9.7  CREATININE 0.98 0.8  GFRNONAA >60  --   GFRAA >60  --     LIVER FUNCTION TESTS: Recent Labs    05/21/16 1811 12/03/16 1119  BILITOT 1.7* 0.24  AST 662* 25  ALT 381* 37  ALKPHOS 237* 105  PROT 8.4* 8.0  ALBUMIN 4.1 3.8    TUMOR MARKERS: No results for input(s): AFPTM, CEA, CA199,  CHROMGRNA in the last 8760 hours.  Assessment and Plan: Head/neck/jaw cancer For port placement Labs ok .Risks and benefits discussed with the patient including, but not limited to bleeding, infection, pneumothorax, or fibrin sheath development and need for additional procedures. All of the patient's questions were answered, patient is agreeable to proceed. Consent signed and in chart.    Thank you for this interesting consult.  I greatly enjoyed meeting DUSTIN BUMBAUGH and look forward to participating in their care.  A copy of this report was sent to the requesting provider on this date.  Electronically Signed: Ascencion Dike, PA-C 12/22/2016, 2:09 PM   I spent a total of 20 minutes in face to face in clinical consultation, greater than 50% of which was  counseling/coordinating care for port

## 2016-12-24 ENCOUNTER — Other Ambulatory Visit: Payer: Self-pay

## 2016-12-24 DIAGNOSIS — C062 Malignant neoplasm of retromolar area: Secondary | ICD-10-CM

## 2016-12-25 ENCOUNTER — Ambulatory Visit: Payer: Self-pay

## 2016-12-25 ENCOUNTER — Telehealth: Payer: Self-pay

## 2016-12-25 ENCOUNTER — Ambulatory Visit: Payer: Self-pay | Admitting: Hematology and Oncology

## 2016-12-25 ENCOUNTER — Other Ambulatory Visit: Payer: Self-pay | Admitting: Radiation Oncology

## 2016-12-25 ENCOUNTER — Other Ambulatory Visit: Payer: Self-pay | Admitting: *Deleted

## 2016-12-25 ENCOUNTER — Other Ambulatory Visit: Payer: Self-pay

## 2016-12-25 NOTE — Telephone Encounter (Signed)
Scheduling message sent to have lab and MD visit and infusion appointments from 11/30 because Dr. Lebron Conners will be out of the office.

## 2016-12-25 NOTE — Telephone Encounter (Signed)
High priority scheduling message sent to change pt's infusion appt to 6.5 hours instead of 2 hour appt on 11/12. Pt to receive cddp. Appt missed 11/9 d/t transportation issues with the facility where the patient is staying.

## 2016-12-26 ENCOUNTER — Telehealth: Payer: Self-pay | Admitting: *Deleted

## 2016-12-26 NOTE — Telephone Encounter (Signed)
Oncology Nurse Navigator Documentation  Spoke with patient, confirmed his understanding of 11/12 appts starting with 8:15 lab, 8:40 Dr. Lebron Conners, 9:30 Infusion. He stated is staying with sister through the weekend, he will arrange transportation to [facility].  [name], RN, BSN, [place]ck Oncology Nurse [place] at [place] [phone]

## 2016-12-28 ENCOUNTER — Telehealth: Payer: Self-pay

## 2016-12-28 ENCOUNTER — Ambulatory Visit: Payer: Self-pay | Admitting: Hematology and Oncology

## 2016-12-28 ENCOUNTER — Ambulatory Visit: Payer: Self-pay

## 2016-12-28 ENCOUNTER — Other Ambulatory Visit: Payer: Self-pay

## 2016-12-28 ENCOUNTER — Encounter: Payer: Self-pay | Admitting: Nutrition

## 2016-12-28 DIAGNOSIS — C062 Malignant neoplasm of retromolar area: Secondary | ICD-10-CM | POA: Diagnosis not present

## 2016-12-28 NOTE — Telephone Encounter (Signed)
Called and spoke to patient at 11:10am about today's missed appointment. This is the second appointment the patient has missed.  Patient stated his daughter had a baby last night and he was just not able to make it today. Dr. Lebron Conners made aware and stated to reschedule patient again for lab, MD, infusion appointments next week (week of 11/19). Scheduling message sent to schedulers. Patient made aware of upcoming radiation appointment on Wednesday 12/30/16 at 12:45pm. Patient stated he will be at the appointment.

## 2016-12-28 NOTE — Telephone Encounter (Signed)
Patient did not show up for 8:15 lab and 8:40 MD appointment/infusion appointment. Called and left voicemail message for [name] who is listed as contact for patient's transportation.

## 2016-12-29 ENCOUNTER — Ambulatory Visit: Payer: Self-pay

## 2016-12-29 ENCOUNTER — Telehealth: Payer: Self-pay | Admitting: *Deleted

## 2016-12-29 NOTE — Telephone Encounter (Signed)
Oncology Nurse Navigator Documentation  Spoke with [facility] scheduler Anitra Lauth, informed pt has 11/19 8:30 audiology evaluation.  Unable to leave VM for patient at listed number, called [facility] transportation coordinator [name] to inform, LM asking for call-back.  Dr. Lebron Conners informed.  [name], RN, BSN, [place] Neck Oncology Nurse [place] at [place] [phone]

## 2016-12-30 ENCOUNTER — Telehealth: Payer: Self-pay | Admitting: *Deleted

## 2016-12-30 ENCOUNTER — Ambulatory Visit: Payer: Medicaid Other

## 2016-12-30 ENCOUNTER — Ambulatory Visit: Payer: Self-pay

## 2016-12-30 ENCOUNTER — Ambulatory Visit: Payer: Medicaid Other | Admitting: Radiation Oncology

## 2016-12-30 ENCOUNTER — Telehealth: Payer: Self-pay | Admitting: Hematology and Oncology

## 2016-12-30 NOTE — Telephone Encounter (Signed)
Called the [facility] re appointments for Patient and was transferred to leave message for [name], who arranges transportation. Left message re appointments for 11/23 and also confirmed 11/19 and 11/21.   Appointments scheduled per 11/9 and 11/12 schedule messages.

## 2016-12-30 NOTE — Telephone Encounter (Signed)
Oncology Nurse Navigator Documentation  Called/spoke with Mr. Krenz, emphasized importance of arriving for RT New Start tomorrow since he missed appt today.  He voiced understanding and confirmed 12:00 arrival to register for 12:15 tmt.    I informed him of 11/19 0830 audiology evaluation at [facility], emphasized importance regarding chemo planned by Dr. Lebron Conners.  He voiced understanding.  I reminded him of new appt 11/23  to start chemo since he missed yesterday's appt. I emphasized importance of starting chemo to maximize the benefit of RT.  He voiced understanding.  He indicated he is not currently staying at [facility] but staying with dtr.  He assured me he can arrange transportation for upcoming appts.  [name], RN, BSN, [place] Neck Oncology Nurse [place] at [place] [phone]

## 2016-12-31 ENCOUNTER — Ambulatory Visit
Admission: RE | Admit: 2016-12-31 | Discharge: 2016-12-31 | Disposition: A | Discharge status: Home / Self Care | Payer: Medicaid Other | Source: Ambulatory Visit | Attending: Radiation Oncology | Admitting: Radiation Oncology

## 2016-12-31 DIAGNOSIS — C062 Malignant neoplasm of retromolar area: Secondary | ICD-10-CM | POA: Diagnosis not present

## 2016-12-31 MED ORDER — SONAFINE EX EMUL
1.0000 "application " | Freq: Once | CUTANEOUS | Status: AC
  Administered 2016-12-31: 1 via TOPICAL

## 2016-12-31 NOTE — Progress Notes (Signed)

## 2017-01-01 ENCOUNTER — Ambulatory Visit
Admission: RE | Admit: 2017-01-01 | Discharge: 2017-01-01 | Disposition: A | Discharge status: Home / Self Care | Payer: Medicaid Other | Source: Ambulatory Visit | Attending: Radiation Oncology | Admitting: Radiation Oncology

## 2017-01-01 ENCOUNTER — Ambulatory Visit: Payer: Self-pay

## 2017-01-01 DIAGNOSIS — C062 Malignant neoplasm of retromolar area: Secondary | ICD-10-CM | POA: Diagnosis not present

## 2017-01-03 ENCOUNTER — Telehealth: Payer: Self-pay | Admitting: *Deleted

## 2017-01-03 ENCOUNTER — Ambulatory Visit: Admission: RE | Admit: 2017-01-03 | Payer: Medicaid Other | Source: Ambulatory Visit

## 2017-01-03 NOTE — Telephone Encounter (Signed)
Called and spoke with Nira Conn at [facility], patient is admitted there but is out on leave to be with his family, will fax schedule to their facility to Nursing st[facility] [phone] fax, hasn't shown for rad tx today  10:17 AM

## 2017-01-04 ENCOUNTER — Ambulatory Visit: Payer: Medicaid Other

## 2017-01-04 ENCOUNTER — Ambulatory Visit: Payer: Self-pay

## 2017-01-05 ENCOUNTER — Telehealth: Payer: Self-pay | Admitting: *Deleted

## 2017-01-05 ENCOUNTER — Ambulatory Visit
Admission: RE | Admit: 2017-01-05 | Discharge: 2017-01-05 | Disposition: A | Discharge status: Home / Self Care | Payer: Medicaid Other | Source: Ambulatory Visit | Attending: Radiation Oncology | Admitting: Radiation Oncology

## 2017-01-05 ENCOUNTER — Encounter: Payer: Self-pay | Admitting: *Deleted

## 2017-01-05 DIAGNOSIS — C062 Malignant neoplasm of retromolar area: Secondary | ICD-10-CM | POA: Diagnosis not present

## 2017-01-05 NOTE — Progress Notes (Signed)
Oncology Nurse Navigator Documentation  Spoke with Mr. Rohrman prior to RT.   He arrived late as he did yesterday.  He stated "bus route messed me up".  I suggested he initiate travel earlier to accommodate delays.    I counseled him on importance of arriving for all scheduled appts and at least 15 minutes before scheduled time.  Explained he can expect to wait if he is late; he refused to wait yesterday upon late arrival.  Emphasized importance of receiving all scheduled treatments to maximize outcome.     He stated he is going to call Social Services and arrange transportation for next week.  I encouraged him to so today, to call me with phone # so I can follow-up with agency.  He agreed.  He stated he did not keep audiology appt yesterday as scheduled.  I counseled him on importance of test as results help gauge best choice of chemo.  I indicated I will speak with Dr. Lebron Conners re adjustment to chemo plan. RTT Heather provided patient updated schedule, counseled again on importance of arriving for appts as scheduled.  [name], RN, BSN, [place] Neck Oncology Nurse [place] at [place] [phone]

## 2017-01-05 NOTE — Telephone Encounter (Signed)
Oncology Nurse Navigator Documentation  Unable to reach pt on his phone, spoke with pt's dtr, [name], with whom is currently staying.  Asked her to remind him of 10:45 appt with arrival 10:15.  She indicated she has been reminding him of appts.  She noted he did not keep Monday's audiology appt.  [name], RN, BSN, [place] Neck Oncology Nurse [place] at [place] [phone]

## 2017-01-06 ENCOUNTER — Encounter: Payer: Self-pay | Admitting: Nutrition

## 2017-01-06 ENCOUNTER — Ambulatory Visit: Payer: Medicaid Other

## 2017-01-06 ENCOUNTER — Ambulatory Visit: Payer: Self-pay

## 2017-01-06 ENCOUNTER — Ambulatory Visit
Admission: RE | Admit: 2017-01-06 | Discharge: 2017-01-06 | Disposition: A | Discharge status: Home / Self Care | Payer: Medicaid Other | Source: Ambulatory Visit | Attending: Radiation Oncology | Admitting: Radiation Oncology

## 2017-01-06 DIAGNOSIS — C062 Malignant neoplasm of retromolar area: Secondary | ICD-10-CM | POA: Diagnosis not present

## 2017-01-06 NOTE — Progress Notes (Signed)
PATIENT DID NOT SHOW UP FOR NUTRITION APPOINTMENT.

## 2017-01-08 ENCOUNTER — Ambulatory Visit: Payer: Self-pay

## 2017-01-08 ENCOUNTER — Other Ambulatory Visit: Payer: Self-pay

## 2017-01-08 ENCOUNTER — Telehealth: Payer: Self-pay

## 2017-01-08 ENCOUNTER — Ambulatory Visit: Payer: Self-pay | Admitting: Hematology and Oncology

## 2017-01-08 NOTE — Telephone Encounter (Signed)
Letter sent to pt regarding frequent "no shows". Patient has had frequent reminders regarding appointments in advance, and has had at least 3 no shows with medical oncology alone. In letter, explained to patient that if he should desire to continue being seen, he will need to f/u with his PCP in order to obtain a new referral. This is in order to give our patients the best care and availability possible. Staff message sent to [name], Nurse Navigator to inform of communication with patient. Future medical oncology and infusion appointments cancelled per Dr. Lebron Conners.

## 2017-01-11 ENCOUNTER — Ambulatory Visit: Payer: Self-pay

## 2017-01-11 ENCOUNTER — Encounter: Payer: Self-pay | Admitting: *Deleted

## 2017-01-11 ENCOUNTER — Ambulatory Visit
Admission: RE | Admit: 2017-01-11 | Discharge: 2017-01-11 | Disposition: A | Discharge status: Home / Self Care | Payer: Medicaid Other | Source: Ambulatory Visit | Attending: Radiation Oncology | Admitting: Radiation Oncology

## 2017-01-11 ENCOUNTER — Other Ambulatory Visit: Payer: Self-pay | Admitting: Radiation Oncology

## 2017-01-11 ENCOUNTER — Ambulatory Visit: Payer: Medicaid Other | Admitting: Nutrition

## 2017-01-11 DIAGNOSIS — C411 Malignant neoplasm of mandible: Secondary | ICD-10-CM

## 2017-01-11 DIAGNOSIS — C062 Malignant neoplasm of retromolar area: Secondary | ICD-10-CM | POA: Diagnosis not present

## 2017-01-11 MED ORDER — HYDROCODONE-ACETAMINOPHEN 7.5-325 MG/15ML PO SOLN: 15.0000 mL | ORAL | 0 refills | Status: DC | PRN

## 2017-01-11 MED ORDER — POLYETHYLENE GLYCOL 3350 17 GM/SCOOP PO POWD: ORAL | 3 refills | Status: AC

## 2017-01-11 NOTE — Progress Notes (Signed)
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 Patient no longer living at [facility].  He reports he lives with his daughter and plans on getting his own place soon. Patient is not using his feeding tube at this time and states he has not used it since he was discharged from [facility].  He has not been flushing his tube daily. Feeding tube is missing certain parts and has not been flushed.  Skin around tube needs to be evaluated. Reports he has a good appetite but he has taste alterations. Weight documented as 132 pounds increased from 124.8 pounds in October. Patient reports he can chew and swallow soft foods on the right side. Patient states he took the bus to his appointment today.  Nutrition diagnosis: Unintended weight loss continues.  Intervention: Patient educated to consume one bottle Ensure Plus 3 times a day between meals. Patient was provided with one complementary case of Ensure Plus Patient encouraged to continue to eat soft foods as desired. I have asked nursing to look at patient's feeding tube.  I have alerted social worker that patient may require assistance with transportation. Questions were answered.  Teach back method used.  Monitoring, evaluation, goals: Patient will tolerate increased oral intake to promote weight maintenance/weight gain.  Next visit: I will continue to follow as needed.  **Disclaimer: This note was dictated with voice recognition software. Similar sounding words can inadvertently be transcribed and this note may contain transcription errors which may not have been corrected upon publication of note.**

## 2017-01-12 ENCOUNTER — Ambulatory Visit
Admission: RE | Admit: 2017-01-12 | Discharge: 2017-01-12 | Disposition: A | Discharge status: Home / Self Care | Payer: Medicaid Other | Source: Ambulatory Visit | Attending: Radiation Oncology | Admitting: Radiation Oncology

## 2017-01-12 DIAGNOSIS — C062 Malignant neoplasm of retromolar area: Secondary | ICD-10-CM | POA: Diagnosis not present

## 2017-01-12 NOTE — Progress Notes (Signed)
Oncology Nurse Navigator Documentation  Met with Mr. Keating prior to 12:00 RT. Provided him updated Epic appt calendar, emphasized importance of arriving for appts minimally 20 minutes prior to designated time. He stated his dtr is going to contact SS to arrange transportation so he does not need to rely on bus.  I encouraged this to be done ASAP, again requested that I be provided phone # so that I can confirm appts with transportation service. I joined him for weekly PUT with Dr. Isidore Moos. Provided cleaning of his button PEG.  He stated he has not been flushing PEG b/c does not have extension, believes it is with his belongings at [facility].   He stated he has left [facility] permanently, is living with his dtr, plans to retrieve his belongings.  I encouraged him to contact me with questions/concerns.  [name], RN, BSN, [place] Oncology Nurse [place] at [place] [phone]

## 2017-01-13 ENCOUNTER — Ambulatory Visit: Payer: Self-pay

## 2017-01-13 ENCOUNTER — Ambulatory Visit
Admission: RE | Admit: 2017-01-13 | Discharge: 2017-01-13 | Disposition: A | Discharge status: Home / Self Care | Payer: Medicaid Other | Source: Ambulatory Visit | Attending: Radiation Oncology | Admitting: Radiation Oncology

## 2017-01-13 DIAGNOSIS — C062 Malignant neoplasm of retromolar area: Secondary | ICD-10-CM | POA: Diagnosis not present

## 2017-01-14 ENCOUNTER — Ambulatory Visit
Admission: RE | Admit: 2017-01-14 | Discharge: 2017-01-14 | Disposition: A | Discharge status: Home / Self Care | Payer: Medicaid Other | Source: Ambulatory Visit | Attending: Radiation Oncology | Admitting: Radiation Oncology

## 2017-01-14 DIAGNOSIS — C062 Malignant neoplasm of retromolar area: Secondary | ICD-10-CM | POA: Diagnosis not present

## 2017-01-15 ENCOUNTER — Ambulatory Visit: Payer: Self-pay

## 2017-01-15 ENCOUNTER — Ambulatory Visit
Admission: RE | Admit: 2017-01-15 | Discharge: 2017-01-15 | Disposition: A | Discharge status: Home / Self Care | Payer: Medicaid Other | Source: Ambulatory Visit | Attending: Radiation Oncology | Admitting: Radiation Oncology

## 2017-01-15 ENCOUNTER — Other Ambulatory Visit: Payer: Self-pay

## 2017-01-15 ENCOUNTER — Ambulatory Visit: Payer: Self-pay | Admitting: Hematology and Oncology

## 2017-01-15 DIAGNOSIS — C062 Malignant neoplasm of retromolar area: Secondary | ICD-10-CM | POA: Diagnosis not present

## 2017-01-18 ENCOUNTER — Encounter: Payer: Self-pay | Admitting: *Deleted

## 2017-01-18 ENCOUNTER — Other Ambulatory Visit: Payer: Self-pay | Admitting: Radiation Oncology

## 2017-01-18 ENCOUNTER — Ambulatory Visit
Admission: RE | Admit: 2017-01-18 | Discharge: 2017-01-18 | Disposition: A | Discharge status: Home / Self Care | Payer: Medicaid Other | Source: Ambulatory Visit | Attending: Radiation Oncology | Admitting: Radiation Oncology

## 2017-01-18 DIAGNOSIS — C411 Malignant neoplasm of mandible: Secondary | ICD-10-CM

## 2017-01-18 DIAGNOSIS — C062 Malignant neoplasm of retromolar area: Secondary | ICD-10-CM | POA: Diagnosis not present

## 2017-01-18 MED ORDER — FLUCONAZOLE 100 MG PO TABS: ORAL_TABLET | ORAL | 0 refills | Status: DC

## 2017-01-18 MED ORDER — HYDROCODONE-ACETAMINOPHEN 7.5-325 MG/15ML PO SOLN: 15.0000 mL | ORAL | 0 refills | Status: DC | PRN

## 2017-01-18 NOTE — Progress Notes (Signed)
Oncology Nurse Navigator Documentation  Patient file review/update, Navigator Spreadsheet update.  Rick Jordane Hisle, RN, BSN, CHPN Head & Neck Oncology Nurse Navigator [facility] at [place] [phone]   

## 2017-01-19 ENCOUNTER — Ambulatory Visit
Admission: RE | Admit: 2017-01-19 | Discharge: 2017-01-19 | Disposition: A | Discharge status: Home / Self Care | Payer: Medicaid Other | Source: Ambulatory Visit | Attending: Radiation Oncology | Admitting: Radiation Oncology

## 2017-01-19 ENCOUNTER — Ambulatory Visit: Payer: Self-pay

## 2017-01-19 DIAGNOSIS — C062 Malignant neoplasm of retromolar area: Secondary | ICD-10-CM | POA: Diagnosis not present

## 2017-01-20 ENCOUNTER — Ambulatory Visit
Admission: RE | Admit: 2017-01-20 | Discharge: 2017-01-20 | Disposition: A | Discharge status: Home / Self Care | Payer: Medicaid Other | Source: Ambulatory Visit | Attending: Radiation Oncology | Admitting: Radiation Oncology

## 2017-01-20 DIAGNOSIS — C062 Malignant neoplasm of retromolar area: Secondary | ICD-10-CM | POA: Diagnosis not present

## 2017-01-21 ENCOUNTER — Ambulatory Visit
Admission: RE | Admit: 2017-01-21 | Discharge: 2017-01-21 | Disposition: A | Discharge status: Home / Self Care | Payer: Medicaid Other | Source: Ambulatory Visit | Attending: Radiation Oncology | Admitting: Radiation Oncology

## 2017-01-21 DIAGNOSIS — C062 Malignant neoplasm of retromolar area: Secondary | ICD-10-CM | POA: Diagnosis not present

## 2017-01-22 ENCOUNTER — Ambulatory Visit
Admission: RE | Admit: 2017-01-22 | Discharge: 2017-01-22 | Disposition: A | Discharge status: Home / Self Care | Payer: Medicaid Other | Source: Ambulatory Visit | Attending: Radiation Oncology | Admitting: Radiation Oncology

## 2017-01-22 DIAGNOSIS — C062 Malignant neoplasm of retromolar area: Secondary | ICD-10-CM | POA: Diagnosis not present

## 2017-01-25 ENCOUNTER — Ambulatory Visit: Payer: Medicaid Other

## 2017-01-26 ENCOUNTER — Ambulatory Visit
Admission: RE | Admit: 2017-01-26 | Discharge: 2017-01-26 | Disposition: A | Discharge status: Home / Self Care | Payer: Medicaid Other | Source: Ambulatory Visit | Attending: Radiation Oncology | Admitting: Radiation Oncology

## 2017-01-26 DIAGNOSIS — C062 Malignant neoplasm of retromolar area: Secondary | ICD-10-CM | POA: Diagnosis not present

## 2017-01-27 ENCOUNTER — Ambulatory Visit
Admission: RE | Admit: 2017-01-27 | Discharge: 2017-01-27 | Disposition: A | Discharge status: Home / Self Care | Payer: Medicaid Other | Source: Ambulatory Visit | Attending: Radiation Oncology | Admitting: Radiation Oncology

## 2017-01-27 DIAGNOSIS — C062 Malignant neoplasm of retromolar area: Secondary | ICD-10-CM | POA: Diagnosis not present

## 2017-01-28 ENCOUNTER — Ambulatory Visit
Admission: RE | Admit: 2017-01-28 | Discharge: 2017-01-28 | Disposition: A | Discharge status: Home / Self Care | Payer: Medicaid Other | Source: Ambulatory Visit | Attending: Radiation Oncology | Admitting: Radiation Oncology

## 2017-01-28 DIAGNOSIS — C062 Malignant neoplasm of retromolar area: Secondary | ICD-10-CM | POA: Diagnosis not present

## 2017-01-29 ENCOUNTER — Ambulatory Visit
Admission: RE | Admit: 2017-01-29 | Discharge: 2017-01-29 | Disposition: A | Discharge status: Home / Self Care | Payer: Medicaid Other | Source: Ambulatory Visit | Attending: Radiation Oncology | Admitting: Radiation Oncology

## 2017-01-29 DIAGNOSIS — C062 Malignant neoplasm of retromolar area: Secondary | ICD-10-CM | POA: Diagnosis not present

## 2017-01-30 ENCOUNTER — Ambulatory Visit
Admission: RE | Admit: 2017-01-30 | Discharge: 2017-01-30 | Disposition: A | Discharge status: Home / Self Care | Payer: Medicaid Other | Source: Ambulatory Visit | Attending: Radiation Oncology | Admitting: Radiation Oncology

## 2017-01-30 DIAGNOSIS — C062 Malignant neoplasm of retromolar area: Secondary | ICD-10-CM | POA: Diagnosis not present

## 2017-02-01 ENCOUNTER — Ambulatory Visit
Admission: RE | Admit: 2017-02-01 | Discharge: 2017-02-01 | Disposition: A | Discharge status: Home / Self Care | Payer: Medicaid Other | Source: Ambulatory Visit | Attending: Radiation Oncology | Admitting: Radiation Oncology

## 2017-02-01 DIAGNOSIS — C062 Malignant neoplasm of retromolar area: Secondary | ICD-10-CM | POA: Diagnosis not present

## 2017-02-02 ENCOUNTER — Telehealth: Payer: Self-pay | Admitting: Hematology and Oncology

## 2017-02-02 ENCOUNTER — Ambulatory Visit
Admission: RE | Admit: 2017-02-02 | Discharge: 2017-02-02 | Disposition: A | Discharge status: Home / Self Care | Payer: Medicaid Other | Source: Ambulatory Visit | Attending: Radiation Oncology | Admitting: Radiation Oncology

## 2017-02-02 DIAGNOSIS — C062 Malignant neoplasm of retromolar area: Secondary | ICD-10-CM | POA: Diagnosis not present

## 2017-02-02 NOTE — Telephone Encounter (Signed)
Scheduled appt per 12/10 sch message from [place] - patient is aware of appt date and time.

## 2017-02-02 NOTE — Telephone Encounter (Signed)
Scheduled appt per 12/10 sch message from [place] - patient is aware of appt date and time.

## 2017-02-03 ENCOUNTER — Ambulatory Visit
Admission: RE | Admit: 2017-02-03 | Discharge: 2017-02-03 | Disposition: A | Discharge status: Home / Self Care | Payer: Medicaid Other | Source: Ambulatory Visit | Attending: Radiation Oncology | Admitting: Radiation Oncology

## 2017-02-03 DIAGNOSIS — C062 Malignant neoplasm of retromolar area: Secondary | ICD-10-CM | POA: Diagnosis not present

## 2017-02-04 ENCOUNTER — Ambulatory Visit
Admission: RE | Admit: 2017-02-04 | Discharge: 2017-02-04 | Disposition: A | Discharge status: Home / Self Care | Payer: Medicaid Other | Source: Ambulatory Visit | Attending: Radiation Oncology | Admitting: Radiation Oncology

## 2017-02-04 DIAGNOSIS — C062 Malignant neoplasm of retromolar area: Secondary | ICD-10-CM | POA: Diagnosis not present

## 2017-02-05 ENCOUNTER — Ambulatory Visit
Admission: RE | Admit: 2017-02-05 | Discharge: 2017-02-05 | Disposition: A | Discharge status: Home / Self Care | Payer: Medicaid Other | Source: Ambulatory Visit | Attending: Radiation Oncology | Admitting: Radiation Oncology

## 2017-02-05 DIAGNOSIS — C062 Malignant neoplasm of retromolar area: Secondary | ICD-10-CM | POA: Diagnosis not present

## 2017-02-08 ENCOUNTER — Ambulatory Visit: Payer: Medicaid Other

## 2017-02-10 ENCOUNTER — Ambulatory Visit: Payer: Self-pay

## 2017-02-10 ENCOUNTER — Ambulatory Visit
Admission: RE | Admit: 2017-02-10 | Discharge: 2017-02-10 | Disposition: A | Discharge status: Home / Self Care | Payer: Medicaid Other | Source: Ambulatory Visit | Attending: Radiation Oncology | Admitting: Radiation Oncology

## 2017-02-10 DIAGNOSIS — C062 Malignant neoplasm of retromolar area: Secondary | ICD-10-CM | POA: Diagnosis not present

## 2017-02-11 ENCOUNTER — Ambulatory Visit
Admission: RE | Admit: 2017-02-11 | Discharge: 2017-02-11 | Disposition: A | Discharge status: Home / Self Care | Payer: Medicaid Other | Source: Ambulatory Visit | Attending: Radiation Oncology | Admitting: Radiation Oncology

## 2017-02-11 ENCOUNTER — Other Ambulatory Visit: Payer: Self-pay | Admitting: Radiation Oncology

## 2017-02-11 DIAGNOSIS — C062 Malignant neoplasm of retromolar area: Secondary | ICD-10-CM | POA: Diagnosis not present

## 2017-02-11 DIAGNOSIS — C411 Malignant neoplasm of mandible: Secondary | ICD-10-CM

## 2017-02-11 MED ORDER — SONAFINE EX EMUL
1.0000 "application " | Freq: Two times a day (BID) | CUTANEOUS | Status: DC
  Administered 2017-02-11: 1 via TOPICAL

## 2017-02-11 MED ORDER — HYDROCODONE-ACETAMINOPHEN 7.5-325 MG/15ML PO SOLN: 15.0000 mL | ORAL | 0 refills | Status: DC | PRN

## 2017-02-12 ENCOUNTER — Ambulatory Visit
Admission: RE | Admit: 2017-02-12 | Discharge: 2017-02-12 | Disposition: A | Discharge status: Home / Self Care | Payer: Medicaid Other | Source: Ambulatory Visit | Attending: Radiation Oncology | Admitting: Radiation Oncology

## 2017-02-12 DIAGNOSIS — C062 Malignant neoplasm of retromolar area: Secondary | ICD-10-CM | POA: Diagnosis not present

## 2017-02-15 ENCOUNTER — Ambulatory Visit
Admission: RE | Admit: 2017-02-15 | Discharge: 2017-02-15 | Disposition: A | Discharge status: Home / Self Care | Payer: Medicaid Other | Source: Ambulatory Visit | Attending: Radiation Oncology | Admitting: Radiation Oncology

## 2017-02-15 DIAGNOSIS — Z510 Encounter for antineoplastic radiation therapy: Secondary | ICD-10-CM | POA: Diagnosis present

## 2017-02-15 DIAGNOSIS — C062 Malignant neoplasm of retromolar area: Secondary | ICD-10-CM | POA: Diagnosis not present

## 2017-02-17 ENCOUNTER — Ambulatory Visit: Payer: Medicaid Other

## 2017-02-17 ENCOUNTER — Ambulatory Visit
Admission: RE | Admit: 2017-02-17 | Discharge: 2017-02-17 | Disposition: A | Discharge status: Home / Self Care | Payer: Medicaid Other | Source: Ambulatory Visit | Attending: Radiation Oncology | Admitting: Radiation Oncology

## 2017-02-17 DIAGNOSIS — Z510 Encounter for antineoplastic radiation therapy: Secondary | ICD-10-CM | POA: Diagnosis not present

## 2017-02-18 ENCOUNTER — Ambulatory Visit: Payer: Medicaid Other

## 2017-02-18 ENCOUNTER — Ambulatory Visit
Admission: RE | Admit: 2017-02-18 | Discharge: 2017-02-18 | Disposition: A | Discharge status: Home / Self Care | Payer: Medicaid Other | Source: Ambulatory Visit | Attending: Radiation Oncology | Admitting: Radiation Oncology

## 2017-02-18 DIAGNOSIS — Z510 Encounter for antineoplastic radiation therapy: Secondary | ICD-10-CM | POA: Diagnosis not present

## 2017-02-19 ENCOUNTER — Ambulatory Visit
Admission: RE | Admit: 2017-02-19 | Discharge: 2017-02-19 | Disposition: A | Discharge status: Home / Self Care | Payer: Medicaid Other | Source: Ambulatory Visit | Attending: Radiation Oncology | Admitting: Radiation Oncology

## 2017-02-19 ENCOUNTER — Encounter: Payer: Self-pay | Admitting: Hematology and Oncology

## 2017-02-19 ENCOUNTER — Ambulatory Visit: Payer: Medicaid Other

## 2017-02-19 ENCOUNTER — Encounter: Payer: Self-pay | Admitting: *Deleted

## 2017-02-19 ENCOUNTER — Inpatient Hospital Stay: Payer: Medicaid Other | Attending: Hematology and Oncology | Admitting: Hematology and Oncology

## 2017-02-19 DIAGNOSIS — C062 Malignant neoplasm of retromolar area: Secondary | ICD-10-CM | POA: Diagnosis present

## 2017-02-19 DIAGNOSIS — Z510 Encounter for antineoplastic radiation therapy: Secondary | ICD-10-CM | POA: Diagnosis not present

## 2017-02-19 DIAGNOSIS — B370 Candidal stomatitis: Secondary | ICD-10-CM | POA: Insufficient documentation

## 2017-02-19 DIAGNOSIS — F17210 Nicotine dependence, cigarettes, uncomplicated: Secondary | ICD-10-CM | POA: Insufficient documentation

## 2017-02-19 DIAGNOSIS — G893 Neoplasm related pain (acute) (chronic): Secondary | ICD-10-CM | POA: Diagnosis not present

## 2017-02-19 DIAGNOSIS — Z79899 Other long term (current) drug therapy: Secondary | ICD-10-CM | POA: Insufficient documentation

## 2017-02-19 MED ORDER — OXYCODONE HCL 10 MG PO TABS: 10.0000 mg | ORAL_TABLET | ORAL | 0 refills | Status: AC | PRN

## 2017-02-19 NOTE — Progress Notes (Signed)
Oncology Nurse Navigator Documentation  Met with pt during Est Pt appt with Dr. Lebron Conners. He voiced understanding chemotherapy is not beneficial at this point in his tmt (RT) d/t initial MedOnc non-compliance. He reported near 100% oral intake of food and hydration, voiced understanding PEG can be removed a couple of weeks s/p EOT if wt continues to be stable. He voiced understanding PET will be scheduled ca 4 months after final RT. He was encouraged to continue HEP, follow-up appts with SLP. He reported still smoking, encouraged by Dr. Lebron Conners and this Navigator to stop in order to maximize tmt outcome.  He voiced understanding. He voiced understanding he will have follow-up with Dr. Lebron Conners following re-staging PET or as needed prior to that date. He was encouraged to contact me with needs/concerns.  [name], RN, BSN Head & Neck Oncology Nurse [place] at [place] [phone]

## 2017-02-22 ENCOUNTER — Ambulatory Visit
Admission: RE | Admit: 2017-02-22 | Discharge: 2017-02-22 | Disposition: A | Discharge status: Home / Self Care | Payer: Medicaid Other | Source: Ambulatory Visit | Attending: Radiation Oncology | Admitting: Radiation Oncology

## 2017-02-22 ENCOUNTER — Ambulatory Visit: Payer: Medicaid Other

## 2017-02-22 ENCOUNTER — Encounter: Payer: Self-pay | Admitting: *Deleted

## 2017-02-22 DIAGNOSIS — C062 Malignant neoplasm of retromolar area: Secondary | ICD-10-CM

## 2017-02-22 DIAGNOSIS — Z510 Encounter for antineoplastic radiation therapy: Secondary | ICD-10-CM | POA: Diagnosis not present

## 2017-02-22 MED ORDER — SONAFINE EX EMUL
1.0000 "application " | Freq: Once | CUTANEOUS | Status: AC
  Administered 2017-02-22: 1 via TOPICAL

## 2017-02-22 NOTE — Progress Notes (Signed)
Oncology Nurse Navigator Documentation  Met with Mr. Keys s/p RT, informed him he will see Garald Balding SLP during H&N [place] prior to 10:45 RT tomorrow.  I provided him 10:00 arrival to Radiation Waiting following lobby registration, emphasized importance of timeliness.  He voiced understanding.  [name], RN, BSN Head & Neck Oncology Nurse [place] at [place] [phone]

## 2017-02-23 ENCOUNTER — Encounter: Payer: Self-pay | Admitting: *Deleted

## 2017-02-23 ENCOUNTER — Ambulatory Visit
Admission: RE | Admit: 2017-02-23 | Discharge: 2017-02-23 | Disposition: A | Discharge status: Home / Self Care | Payer: Medicaid Other | Source: Ambulatory Visit | Attending: Radiation Oncology | Admitting: Radiation Oncology

## 2017-02-23 ENCOUNTER — Other Ambulatory Visit: Payer: Self-pay

## 2017-02-23 ENCOUNTER — Ambulatory Visit: Payer: Medicaid Other

## 2017-02-23 ENCOUNTER — Ambulatory Visit: Payer: Medicaid Other | Attending: Radiation Oncology

## 2017-02-23 DIAGNOSIS — R471 Dysarthria and anarthria: Secondary | ICD-10-CM | POA: Diagnosis present

## 2017-02-23 DIAGNOSIS — Z510 Encounter for antineoplastic radiation therapy: Secondary | ICD-10-CM | POA: Diagnosis not present

## 2017-02-23 DIAGNOSIS — R1310 Dysphagia, unspecified: Secondary | ICD-10-CM | POA: Diagnosis present

## 2017-02-23 NOTE — Progress Notes (Signed)
Oncology Nurse Navigator Documentation  Met with Lawrence Arias after final RT to offer support and to celebrate end of radiation treatment.  He was accompanied by his SO. I provided SO with a Certificate of Recognition for her supportive care while he rec'd RT . I provided verbal/written post-RT guidance:  Importance of keeping all follow-up appts, especially those with Nutrition and SLP.  Importance of protecting treatment area from sun.  Continuation of Sonafine application 2-3 times daily until supply exhausted after which transition to OTC lotion with vitamin E. I explained my role as navigator will continue for several more months and that I will be calling and/or joining him during follow-up visits.   I encouraged him to call me with needs/concerns.   Patient and SO verbalized understanding of information provided.  [name], RN, BSN, [place] at [place] [phone]

## 2017-02-23 NOTE — Progress Notes (Signed)
Oncology Nurse Navigator Documentation  Met with Lawrence Arias during H&N [place].  He was accompanied by SO.  Provided verbal and written overview of [place], the clinicians who will be seeing him, encouraged him to ask questions during his time with them.  He was seen by SLP. I provided/discussed opportunities for smoking cessation support:   QuitSmart class/individual counseling at [employer] information  I explained arrangements will be made for him to see PT Serafina Royals at [facility].  He voiced understanding.  I escorted him to RT s/p MDC.   I encouraged him to call me with needs/concerns.  [name], RN, BSN, [place] at [place] [phone]

## 2017-02-23 NOTE — Addendum Note (Signed)
Addended by: Garald Balding B on: 02/23/2017 05:00 PM   Modules accepted: Orders

## 2017-02-23 NOTE — Patient Instructions (Signed)
SWALLOWING EXERCISES Do these 6 of the 7 days per week until September 23, 2017, then 3 times per week afterwards  1. Effortful Swallows - Press your tongue against the roof of your mouth for 3 seconds, then squeeze          the muscles in your neck while you swallow your saliva or a sip of water - Repeat 20 times, 2-3 times a day, and use whenever you eat or drink  2. Masako Swallow - swallow with your tongue sticking out - Stick tongue out past your teeth and gently bite tongue with your teeth - Swallow, while holding your tongue with your teeth - Repeat 20 times, 2-3 times a day *use a wet spoon if your mouth gets dry*  3. Shaker Exercise - head lift - Lie flat on your back in your bed or on a couch without pillows - Raise your head and look at your feet - KEEP YOUR SHOULDERS DOWN - HOLD FOR 45-60 SECONDS, then lower your head back down - Repeat 3 times, 2-3 times a day  4. Mendelsohn Maneuver - "half swallow" exercise - Start to swallow, and keep your Adam's apple up by squeezing hard with the            muscles of the throat - Hold the squeeze for 5-7 seconds and then relax - Repeat 20 times, 2-3 times a day *use a wet spoon if your mouth gets dry*  5. Tongue Stretch/Teeth Clean - Move your tongue around the pocket between your gums and teeth, clockwise and then counter-clockwise - Repeat on the back side, clockwise and then counter-clockwise - Repeat 15-20 times, 2-3 times a day  6. Breath Hold - Say "HUH!" loudly, then hold your breath for 3 seconds at your voice box - Repeat 20 times, 2-3 times a day  7. Chin pushback - Open your mouth  - Place your fist UNDER your chin near your neck, and push back with your fist for 5 seconds - Repeat 10 times, 2-3 times a day

## 2017-02-23 NOTE — Therapy (Signed)
[facility] [address] [place], [place], [address] Phone: [phone]   Fax:  [phone]  Speech Language Pathology Evaluation  Patient Details  Name: ANDRICK RUST MRN: [id number] Date of Birth: 17-Mar-1960 Referring Provider: Eppie Gibson, MD   Encounter Date: 02/23/2017  End of Session - 02/23/17 1131    Visit Number  1    Number of Visits  4    Date for SLP Re-Evaluation  06/16/17    Authorization Type  medicaid - submitted 02-23-17    SLP Start Time  1030    SLP Stop Time   1112    SLP Time Calculation (min)  42 min    Activity Tolerance  Patient tolerated treatment well       Past Medical History:  Diagnosis Date  . Cancer ([place])   . GERD (gastroesophageal reflux disease)    uses OTC prn  . Hypertension   . Tongue lesion     Past Surgical History:  Procedure Laterality Date  . DIRECT LARYNGOSCOPY N/A 07/24/2016   Procedure: DIRECT LARYNGOSCOPY;  Surgeon: Melissa Montane, MD;  Location: [place];  Service: ENT;  Laterality: N/A;  Direct laryngoscopy with biopsy tongue lesion  . FRACTURE SURGERY     left wrist, remote  . FREE FLAP RADIAL FOREARM Left 10/16/2016   Dr. Hendricks [facility]  . free flap scapular Left 10/16/2016   Dr. Hendricks Limes - [facility]  . GASTROSTOMY  10/27/2016   Dr. Daphene Calamity- [facility] hospital.   . IR FLUORO GUIDE PORT INSERTION RIGHT  12/22/2016  . IR US GUIDE VASC ACCESS RIGHT  12/22/2016  . NECK DISSECTION Bilateral 10/16/2016   Selective neck dissection, Dr. Hendricks Limes [facility]  . 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 Hendricks Limes [facility]  . SKIN GRAFT Left 10/16/2016   Split thickness skin graft, leg Dr. Hendricks Limes. [facility]  . TONGUE BIOPSY  07/24/2016   Procedure: TONGUE BIOPSY;  Surgeon: Melissa Montane, MD;  Location: [place];  Service: ENT;;  . TRACHEOSTOMY  10/16/2016   Dr. Hendricks Limes [facility]    There  were no vitals filed for this visit.  Subjective Assessment - 02/23/17 1040    Subjective  Pt reports receiving exercises from SLP at [facility]. They are at home.    Patient is accompained by:  Family member    Currently in Pain?  No/denies         SLP Evaluation [facility] - 02/23/17 1040      SLP Visit Information   SLP Received On  02/23/17    Referring Provider  Eppie Gibson, MD    Onset Date  August 2018    Medical Diagnosis  Lt retromolar trigone SCCA      General Information   HPI  Pt demonstrated Masako from his reported current HEP. Could not recall other  exercises.      Balance Screen   Has the patient fallen in the past 6 months  No    Has the patient had a decrease in activity level because of a fear of falling?   No    Is the patient reluctant to leave their home because of a fear of falling?   No      Oral Motor/Sensory Function   Overall Oral Motor/Sensory Function  Impaired    Labial ROM  Reduced left    Labial Symmetry  Abnormal symmetry left  Labial Strength  Reduced Left    Lingual ROM  Reduced left    Lingual Symmetry  Within Functional Limits    Lingual Strength  Reduced Left    Lingual Sensation  Within Functional Limits    Lingual Coordination  Reduced    Facial ROM  Reduced left    Overall Oral Motor/Sensory Function  SLP appreciates surgical changes in pt's lt buccal and the area of what would be lt upper/lower molars. Pt reports chewing/mainuplating/"mushing" food on the RIGHT side of his oral cavity, which to this SLP looks most functional for pt at this time.       Motor Speech   Overall Motor Speech  Impaired    Resonance  Hypernasality mild    Articulation  Impaired    Level of Impairment  Sentence    Intelligibility  Intelligible      Pt currently tolerates pureed diet with thin liquids, and restricts chewing/"mashing" to rt side of oral cavity. He has his last day of rad tx today. No overt s/s aspiration PNA to date, nor any reported  by pt/family. Pt has some hardened muscle tissue in bil neck likely due to changes post surgery.  POs: Pt ate applesauce and drank water with delayed wet voice approx 90 seconds-2 minutes after POs. SLP educated pt about hydrophonic voice and what to do to clear (throat clear and reswallow). Pt return demonstrated this to SLP. Thyroid elevation appeared WNL, and swallows appeared mostly timely. Oral residue noted as minimal as pt restricts boluses to rt oral cavity. Pt's swallow deemed WFL at this time for pureed/thin.   Because data states the risk for dysphagia during and after radiation treatment is high due to undergoing radiation tx, SLP taught pt about the possibility of reduced/limited ability for PO intake during rad tx.   SLP educated pt re: changes to swallowing musculature after rad tx, and why adherence to dysphagia HEP provided today and PO consumption was necessary to inhibit muscular disuse atrophy and to reduce muscle fibrosis following rad tx. Pt demonstrated understanding of these things to SLP.    SLP then developed a HEP for pt and pt was instructed how to perform exercises involving lingual, vocal, and pharyngeal strengthening. SLP performed each exercise and pt return demonstrated each exercise. SLP ensured pt performance was correct prior to moving on to next exercise. Pt was instructed to complete this program 2-3 times a day, 6-7 days/week until early August 2019, then x2-3 a week after that.                 SLP Education - 02/23/17 1130    Education provided  Yes    Education Details  HEP, late effects head/neck radiation on swallowing, clear throat/reswallow with hydrophonic voice    Person(s) Educated  Patient    Methods  Explanation;Verbal cues;Demonstration    Comprehension  Verbalized understanding;Returned demonstration;Verbal cues required;Need further instruction         SLP Long Term Goals - 02/23/17 1317      SLP LONG TERM GOAL #1   Title  pt  will complete HEP with rare min A over two sessions    Baseline  total A    Time  3    Period  -- sessions    Status  New      SLP LONG TERM GOAL #2   Title  pt will tell SLP why he is completing HEP with modified independence    Baseline  total  A    Time  3    Period  -- sessions    Status  New      SLP LONG TERM GOAL #3   Title  pt will tell SLP when HEP frequency can be reduced to x2-3/week    Baseline  total A    Time  3    Period  -- sessions    Status  New       Plan - 02/23/17 1133    Clinical Impression Statement  Pt with oropharyngeal swallowing functional for dys I and thin, however the probability of swallowing difficulty increases with undergoing chemo and radiation therapy. PT had ST services at a SNF, however HEP is at home and pt does not adequately recall what exercises were on HEP. SLP is not convinced pt was regularly completing swallowing HEP as he would have likely recalled exercises had he been completing on a regular basis. Pt will need to be followed by SLP for regular assessment of accurate HEP completion as well as for safety with POs both during and following treatment/s.    Speech Therapy Frequency  -- approx once every four weeks    Duration  -- 4 total visits    Treatment/Interventions  Aspiration precaution training;Pharyngeal strengthening exercises;Diet toleration management by SLP;Trials of upgraded texture/liquids;Internal/external aids;Patient/family education;Compensatory strategies;SLP instruction and feedback    Potential to Achieve Goals  Good    SLP Home Exercise Plan  provided today    Consulted and Agree with Plan of Care  Patient       Patient will benefit from skilled therapeutic intervention in order to improve the following deficits and impairments:   Dysphagia, unspecified type  Dysarthria and anarthria    Problem List Patient Active Problem List   Diagnosis Date Noted  . Oral cancer ([place]) 08/25/2016  . Squamous cell cancer of  retromolar trigone ([place]) 08/18/2016    [place] ,[place], [place]  02/23/2017, 4:52 PM  [facility] [address] [place], [place], [address] Phone: [phone]   Fax:  [phone]  Name: TYTAN SANDATE MRN: [id number] Date of Birth: 11-May-1960

## 2017-02-26 ENCOUNTER — Encounter: Payer: Self-pay | Admitting: Radiation Oncology

## 2017-02-26 NOTE — Progress Notes (Signed)
  Radiation Oncology         [phone] ________________________________  Name: Lawrence Arias MRN: [id number]  Date: 02/26/2017  DOB: 10-Mar-1960  End of Treatment Note  Diagnosis:  Cancer of retromolar area Pathologic staging: T2N3bMx squamous cell carcinoma, Grade 2, of the left retromolar area, with PNI and LVSI and ECEmi. Margin status unclear.  Indication for treatment:  Curative    Radiation treatment dates:   12/31/2016 - 02/24/2016  Site/dose:  Left oral cavity and regional nodes in bilateral neck/ total dose 66 Gy in 33 fx  Beams/energy: VMAT IMRT /6X  Narrative: The patient tolerated radiation treatment relatively well but missed multiple treatments due to social stressors (these were made up at the end of treatment).  He was advised to minimize missed treatments as this could compromise his prognosis.  Support was given through navigation, social work, Engineer, maintenance (IT), SLP.     Plan: The patient has completed radiation treatment. The patient will return to radiation oncology clinic for routine followup in one half month. I advised them to call or return sooner if they have any questions or concerns related to their recovery or treatment.  -----------------------------------  Eppie Gibson, MD  This document serves as a record of services personally performed by Eppie Gibson, MD. It was created on her behalf by Valeta Harms, a trained medical scribe. The creation of this record is based on the scribe's personal observations and the provider's statements to them. This document has been checked and approved by the attending provider.

## 2017-03-03 ENCOUNTER — Encounter: Payer: Self-pay | Admitting: Radiation Oncology

## 2017-03-03 NOTE — Assessment & Plan Note (Signed)
57 y.o.   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  Patient was supposed to be on systemic chemotherapy, but did not follow-up with any of the scheduled appointments in the beginning of the treatment necessitating discontinuation of the systemic therapy plan.  At this time, he appears to be expressing interest in systemic chemotherapy.  He has only a few radiation treatments left.  At this point, I do not see significant benefit and systemic chemotherapy to be administered after completion of radiation.  Sequential administration of radiation followed by chemotherapy does not have nearly the same benefit as the concurrent administration.  In this situation, I would rather monitor the patient for potential recurrence and treat him with immunotherapy or chemotherapy if the cancer were to recur and save him potential toxicity at this junction.  Plan: -- Change pain medication to oxycodone 10 mg every 4 hours as needed, patient can supplement with acetaminophen 1000 mg 3 times a day as needed -- Return to our clinic as needed, I will be happy to continue providing symptom management, but I will defer follow-up of the patient to Dr. Isidore Moos and Dr. Janace Hoard in terms of disease assessment with PET/CT and recurrence monitoring in the future

## 2017-03-03 NOTE — Progress Notes (Signed)
[facility] Follow-up Visit:  Assessment: Squamous cell cancer of retromolar trigone ([place]) 57 y.o.   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  Patient was supposed to be on systemic chemotherapy, but did not follow-up with any of the scheduled appointments in the beginning of the treatment necessitating discontinuation of the systemic therapy plan.  At this time, he appears to be expressing interest in systemic chemotherapy.  He has only a few radiation treatments left.  At this point, I do not see significant benefit and systemic chemotherapy to be administered after completion of radiation.  Sequential administration of radiation followed by chemotherapy does not have nearly the same benefit as the concurrent administration.  In this situation, I would rather monitor the patient for potential recurrence and treat him with immunotherapy or chemotherapy if the cancer were to recur and save him potential toxicity at this junction.  Plan: -- Change pain medication to oxycodone 10 mg every 4 hours as needed, patient can supplement with acetaminophen 1000 mg 3 times a day as needed -- Return to our clinic as needed, I will be happy to continue providing symptom management, but I will defer follow-up of the patient to Dr. Isidore Moos and Dr. Janace Hoard in terms of disease assessment with PET/CT and recurrence monitoring in the future  Voice recognition software was used and creation of this note. Despite my best effort at editing the text, some misspelling/errors may have occurred.  No orders of the defined types were placed in this encounter.   Cancer Staging Oral cancer [facility]) Staging form: Oral Cavity, AJCC 8th Edition - Clinical: Stage III (cT3, cN1, cM0) - Signed by Eppie Gibson, MD on 08/25/2016  Squamous cell cancer of retromolar trigone ([place]) Staging form: Oral Cavity, AJCC 8th Edition -  Pathologic stage from 10/16/2016: Stage IVB (pT2, pN3b, cM0) - Unsigned   All questions were answered.  . The patient knows to call the clinic with any problems, questions or concerns.  This note was electronically signed.    History of Presenting Illness Lawrence Arias is a 57 y.o. 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 Since the diagnosis in Jul 2018, patient underwent surgery on 10/16/16 including graft tissue placement. Recovered well from the operation, and he is referred to medical oncology for possible systemic chemotherapy as an adjunctive for the adjuvant radiation therapy. His primary ENT is Dr. Janace Hoard and Radiation Oncologist is Dr Isidore Moos.  Due to concerns for residual/recurrent disease in the surgical bed, patient was recommended to undergo chemoradiotherapy and was planned for the treatment in mid October 2018.  Nevertheless, he did not show up for several appointments with our clinic and eventually we made decision to forego chemotherapy.  Patient is nearing completion of his radiation therapy and tolerated it quite well so far.   Patient returns to the clinic to discuss possible initiation of systemic chemotherapy.  At this time, patient denies any fever, chills, night sweats. Denies chest pain, shortness of breath, or cough. Continues to have moderate pain in the head and neck area which is well-controlled with her current medication. Denies any dysphagia, odynophagia, nausea, vomiting, abdominal pain, diarrhea, or constipation. No dysuria or hematuria. No sensory deficit or weakness in the extremities or face at this time.   Oncological/hematological History:   Squamous cell cancer of retromolar trigone ([place])   07/24/2016 Initial Diagnosis    Squamous  cell cancer of retromolar trigone [facility]): Initial biopsy demonstrating moderately differentiated invasive squamous cell carcinoma.       08/14/2016 Imaging    CT  neck: Nonvisualization of left retromolar trigone mucosal lesion. No evidence of invasion into the adjacent structures. 2. Left level IIa lymph node measuring 1 cm, likely indicating spread of disease. No contralateral adenopathy.      09/29/2016 Imaging    CT Neck [facility]): Left-sided pathological lymphadenopathy of metastatic disease at level IB, IIA. Possible involvement of level IA, level III, and level IV on the left side.      10/16/2016 Surgery    Dr Harriett Rush [facility] [place]  [facility]): LEFT TONSIL, SOFT PALATE, FLOOR OF MOUTH, BASE OF TONGUE, AND MANDIBLE, PARTIAL GLOSSOPHARYNGECTOMY AND PARTIAL MANDIBULECTOMY, LEFT PARAPHARYNGEAL MASS, EXCISION, TEETH, REMOVAL       10/16/2016 Pathology Results    Surgical Pathology:  LEFT TONSIL, SOFT PALATE, FLOOR OF MOUTH, BASE OF TONGUE, AND MANDIBLE, PARTIAL GLOSSOPHARYNGECTOMY AND PARTIAL MANDIBULECTOMY: Invasive squamous cell carcinoma, keratinizing type, moderately differentiated, is identified forming a 3.7x1.8x1.0cm mass in the left retromolar area.Tumor invades to a depth of 0.8 cm.Lymphovascular invasion is identified. Perineural invasion is identified. The medial soft tissue margin is positive for invasive squamous cell carcinoma (focal). LEFT PARAPHARYNGEAL MASS, EXCISION: Invasive squamous cell carcinoma forming a 1.2x1.2x0.5cm mass. Invasive carcinoma involves the unoriented soft tissue margin over a linear length of 0.3 cm. TEETH, REMOVAL: Two teeth (gross examination only).  Margins: "TUMOR NEXT TO BONE", EXCISION: Scant fragments of bone and dense fibrous tissue, negative for carcinoma. "PHARYNGEAL WALL MARGIN", EXCISION: Squamous mucosa with acute inflammation and skeletal muscle, negative for carcinoma. "HARD PALATE MARGIN", EXCISION: Squamous mucosa, negative for carcinoma. "TONGUE BASE MARGIN", EXCISION: Squamous mucosa with chronic inflammation, negative for carcinoma. "INFERIOR BUCCAL MUCOSA MARGIN", EXCISION: Squamous mucosa  and skeletal muscle, negative for carcinoma. "SUPERIOR BUCCAL MUCOSAL MARGIN", EXCISION: Squamous mucosa with chronic inflammation, negative for carcinoma.  Lymph nodes:  LEVEL 1A NECK CONTENTS, LYMPH NODE DISSECTION: Three lymph nodes, negative for metastatic carcinoma (0/3). LEFT LEVEL 1B NECK CONTENTS, LYMPH NODE DISSECTION: Metastatic carcinoma in two of five lymph nodes (2/5). Largest metastatic deposit measures 2.4 cm. Definitive extranodal extension is not identified. Salivary gland parenchyma, negative for tumor. LEFT LEVEL 2B NECK CONTENTS, LYMPH NODE DISSECTION: Eight lymph nodes, negative for metastatic carcinoma (0/8). LEFT LEVEL 2A, 3, 4, AND 5 NECK CONTENTS, LYMPH NODE DISSECTION: Metastatic carcinoma in one of twenty-two lymph nodes (1/22). Largest metastatic deposit measures 3.5 cm. Extranodal extension is present and measures 1.5 mm beyond the lymph node capsule.  COMMENT: Immunohistochemical staining for p16 (Block A4) shows the invasive carcinoma is diffusely positive for p16.The positive control stains appropriately.      11/04/2016 Imaging    CT Neck [facility]): No pathological lymphadenopathy. Surgical changes noted. Significant fluid collection in the left upper neck with internal gas and significant soft tissue swelling.      11/30/2016 Imaging    CT Neck: Postop resection of retromolar trigone carcinoma on the left with free flap. Postsurgical changes left neck without evidence of recurrent tumor.   CT Chest: No suspicious pulmonary nodules or masses identified. No thoracic adenopathy.      12/31/2016 - 02/23/2017 Radiation Therapy    Left oral cavity and regional nodes: --66Gy/33 fx         Medical History: Past Medical History:  Diagnosis Date  . Cancer ([place])   . GERD (gastroesophageal reflux disease)    uses OTC prn  .  History of radiation therapy 12/31/16- 02/23/2017   Left oral cavity and regional nodes, 66 Gy in 33 fractions.   . Hypertension   .  Tongue lesion     Surgical History: Past Surgical History:  Procedure Laterality Date  . DIRECT LARYNGOSCOPY N/A 07/24/2016   Procedure: DIRECT LARYNGOSCOPY;  Surgeon: Melissa Montane, MD;  Location: [facility];  Service: ENT;  Laterality: N/A;  Direct laryngoscopy with biopsy tongue lesion  . FRACTURE SURGERY     left wrist, remote  . FREE FLAP RADIAL FOREARM Left 10/16/2016   Dr. Hendricks L[facility]  . free flap scapular Left 10/16/2016   Dr. Hendricks Limes - [facility] hospital  . GASTROSTOMY  10/27/2016   Dr. Daphene Calamity- [facility] hospital.   . IR FLUORO GUIDE PORT INSERTION RIGHT  12/22/2016  . IR US GUIDE VASC ACCESS RIGHT  12/22/2016  . NECK DISSECTION Bilateral 10/16/2016   Selective neck dissection, Dr. Hendricks Limes [facility]  . 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 Hendricks Limes [facility]  . SKIN GRAFT Left 10/16/2016   Split thickness skin graft, leg Dr. Hendricks Limes. [facility]  . TONGUE BIOPSY  07/24/2016   Procedure: TONGUE BIOPSY;  Surgeon: Melissa Montane, MD;  Location: [place] [facility];  Service: ENT;;  . TRACHEOSTOMY  10/16/2016   Dr. Hendricks Limes [facility]    Family History: Family History  Problem Relation Age of Onset  . Hypertension Mother   . Hypertension Father     Social History: Social History   Socioeconomic History  . Marital status: Single    Spouse name: Not on file  . Number of children: 3  . Years of education: Not on file  . Highest education level: Not on file  Social Needs  . Financial resource strain: Not on file  . Food insecurity - worry: Not on file  . Food insecurity - inability: Not on file  . Transportation needs - medical: Not on file  . Transportation needs - non-medical: Not on file  Occupational History  . Not on file  Tobacco Use  . Smoking status: Current Some Day Smoker    Packs/day: 0.50    Types: Cigarettes  . Smokeless tobacco: Never Used  . Tobacco comment: he is smoking 0ne every other day  (11/20/16)  Substance and Sexual Activity  . Alcohol use: No  . Drug use: No  . Sexual activity: Not on file  Other Topics Concern  . Not on file  Social History Narrative  . Not on file    Allergies: No Known Allergies  Medications:  Current Outpatient Medications  Medication Sig Dispense Refill  . acetaminophen (TYLENOL) 500 MG tablet Take 1,000 mg by mouth every 6 (six) hours as needed for moderate pain.    Marland Kitchen amLODipine (NORVASC) 5 MG tablet Take 5 mg by mouth daily.  0  . buPROPion (WELLBUTRIN SR) 150 MG 12 hr tablet Start one week before quit date. Take 1 tab daily x 3 days, then 1 tab BID thereafter. 60 tablet 2  . fluconazole (DIFLUCAN) 100 MG tablet Take 2 tablets today, then 1 tablet daily x 20 more days. 22 tablet 0  . gabapentin (NEURONTIN) 300 MG capsule Take 1 capsule (300 mg total) by mouth 3 (three) times daily. To address cancer related pain. 90 capsule 0  . lidocaine (XYLOCAINE) 2 % solution Use as directed 20 mLs in the mouth or throat as needed for mouth pain. 100 mL 0  .  nicotine (NICODERM CQ - DOSED IN MG/24 HOURS) 14 mg/24hr patch Place 1 patch (14 mg total) onto the skin daily. Apply 21 mg patch daily x 6 wk, then 14mg  patch daily x 2 wk, then 7 mg patch daily x 2 wk 14 patch 0  . nicotine (NICODERM CQ - DOSED IN MG/24 HOURS) 21 mg/24hr patch Place 1 patch (21 mg total) onto the skin daily. Apply 21 mg patch daily x 6 wk, then 14mg  patch daily x 2 wk, then 7 mg patch daily x 2 wk 14 patch 2  . nicotine (NICODERM CQ - DOSED IN MG/24 HR) 7 mg/24hr patch Place 1 patch (7 mg total) onto the skin daily. Apply 21 mg patch daily x 6 wk, then 14mg  patch daily x 2 wk, then 7 mg patch daily x 2 wk 14 patch 0  . polyethylene glycol powder (MIRALAX) powder Use as directed for constipation. Follow directions on container. 850 g 3  . oxyCODONE 10 MG TABS Take 1 tablet (10 mg total) by mouth every 4 (four) hours as needed for up to 14 days for severe pain. 50 tablet 0   No current  facility-administered medications for this visit.     Review of Systems: Review of Systems  All other systems reviewed and are negative.    PHYSICAL EXAMINATION Blood pressure (!) 149/102, pulse 87, temperature 99.1 F (37.3 C), temperature source Oral, resp. rate 18, weight 124 lb 9.6 oz (56.5 kg), SpO2 100 %.  ECOG PERFORMANCE STATUS: 2 - Symptomatic, <50% confined to bed  Physical Exam  Constitutional: He is oriented to person, place, and time and well-developed, well-nourished, and in no distress. No distress.  HENT:  Surgical incisions are well-healed. Left buccal mucosal surface replaced with a graft tissue. No obvious visible cancer.  Eyes: EOM are normal. Pupils are equal, round, and reactive to light. No scleral icterus.  Neck: Normal range of motion. Neck supple. No thyromegaly present.  Cardiovascular: Normal rate, regular rhythm and intact distal pulses.  No murmur heard. Pulmonary/Chest: Effort normal and breath sounds normal. No respiratory distress. He has no wheezes. He has no rales.  Abdominal: Soft. Bowel sounds are normal. He exhibits no distension and no mass. There is no tenderness. There is no rebound and no guarding.  Musculoskeletal: Normal range of motion. He exhibits no edema.  Lymphadenopathy:    He has no cervical adenopathy.  Neurological: He is alert and oriented to person, place, and time. He has normal reflexes. No cranial nerve deficit. Coordination normal.  Skin: Skin is dry. No rash noted. He is not diaphoretic. No erythema. No pallor.     LABORATORY DATA: I have personally reviewed the data as listed: No visits with results within 1 Week(s) from this visit.  Latest known visit with results is:  Hospital Outpatient Visit on 12/22/2016  Component Date Value Ref Range Status  . WBC 12/22/2016 5.9  4.0 - 10.5 K/uL Final  . RBC 12/22/2016 3.45* 4.22 - 5.81 MIL/uL Final  . Hemoglobin 12/22/2016 11.5* 13.0 - 17.0 g/dL Final  . HCT 12/22/2016 33.6*  39.0 - 52.0 % Final  . MCV 12/22/2016 97.4  78.0 - 100.0 fL Final  . MCH 12/22/2016 33.3  26.0 - 34.0 pg Final  . MCHC 12/22/2016 34.2  30.0 - 36.0 g/dL Final  . RDW 12/22/2016 15.9* 11.5 - 15.5 % Final  . Platelets 12/22/2016 421* 150 - 400 K/uL Final  . Prothrombin Time 12/22/2016 15.8* 11.4 - 15.2 seconds Final  .  INR 12/22/2016 1.27   Final       Ardath Sax, MD

## 2017-03-03 NOTE — Progress Notes (Signed)
error 

## 2017-03-09 ENCOUNTER — Ambulatory Visit
Admission: RE | Admit: 2017-03-09 | Discharge: 2017-03-09 | Disposition: A | Discharge status: Home / Self Care | Payer: Medicaid Other | Source: Ambulatory Visit | Attending: Radiation Oncology | Admitting: Radiation Oncology

## 2017-03-09 HISTORY — DX: Personal history of irradiation: Z92.3

## 2017-03-12 ENCOUNTER — Encounter: Payer: Self-pay | Admitting: Hematology and Oncology

## 2017-03-12 ENCOUNTER — Other Ambulatory Visit: Payer: Self-pay | Admitting: Hematology and Oncology

## 2017-03-12 ENCOUNTER — Telehealth: Payer: Self-pay | Admitting: Hematology and Oncology

## 2017-03-12 ENCOUNTER — Inpatient Hospital Stay (HOSPITAL_BASED_OUTPATIENT_CLINIC_OR_DEPARTMENT_OTHER): Payer: Medicaid Other | Admitting: Hematology and Oncology

## 2017-03-12 ENCOUNTER — Other Ambulatory Visit: Payer: Self-pay

## 2017-03-12 ENCOUNTER — Encounter: Payer: Self-pay | Admitting: Nutrition

## 2017-03-12 VITALS — BP 169/116 | HR 118 | Temp 98.6°F | Resp 17 | Ht 67.0 in | Wt 122.7 lb

## 2017-03-12 DIAGNOSIS — F17210 Nicotine dependence, cigarettes, uncomplicated: Secondary | ICD-10-CM

## 2017-03-12 DIAGNOSIS — G893 Neoplasm related pain (acute) (chronic): Secondary | ICD-10-CM

## 2017-03-12 DIAGNOSIS — C062 Malignant neoplasm of retromolar area: Secondary | ICD-10-CM | POA: Diagnosis not present

## 2017-03-12 DIAGNOSIS — Z79899 Other long term (current) drug therapy: Secondary | ICD-10-CM

## 2017-03-12 DIAGNOSIS — B370 Candidal stomatitis: Secondary | ICD-10-CM

## 2017-03-12 MED ORDER — FLUCONAZOLE 200 MG PO TABS: 200.0000 mg | ORAL_TABLET | Freq: Every day | ORAL | 0 refills | Status: AC

## 2017-03-12 MED ORDER — OXYCODONE HCL 10 MG PO TABS: 10.0000 mg | ORAL_TABLET | ORAL | 0 refills | Status: DC | PRN

## 2017-03-12 NOTE — Telephone Encounter (Signed)
Letter/Calendar mailed to patient w/ follow up appointment

## 2017-03-12 NOTE — Progress Notes (Signed)
Patient was provided with 2nd case of Ensure Plus.

## 2017-03-16 ENCOUNTER — Telehealth: Payer: Self-pay | Admitting: *Deleted

## 2017-03-16 NOTE — Telephone Encounter (Signed)
CALLED PATIENT TO ASK ABOUT RESCHEDULING FU FROM 03-09-17, PT. AGREED TO COME IN ON 03-19-17 @ 2:40 PM

## 2017-03-18 NOTE — Progress Notes (Signed)
[name] presents for follow up of radiation completed 02/23/17 to his left oral cavity and regional nodes.   Pain issues, if any: He reports pain to his Left neck. He rates it a 9/10. He was prescribed oxycodone by Dr. Lebron Conners on 1/25, but didn't pick it up because he didn't know which pharmacy the medicine was sent to. I have informed him of the pharmacy and he plans to pick up the medicine today.  Using a feeding tube?: He has a feeding tube, he is only flushing it not using it for nutrition.  Weight changes, if any:  02/01/17 124.8 lbs 02/11/17 121.6 lbs 02/15/17 120.4 lbs 02/22/17 122.4 lbs 03/19/17 122.0 lb Swallowing issues, if any: He is eating softer foods like eggs, grits, and oatmeal.  Smoking or chewing tobacco? He tells me that he smokes several cigarettes daily.  Using fluoride trays daily? N/A Last ENT visit was on:  Other notable issues, if any:  Dr. Lebron Conners 03/12/17 next 03/26/17 Garald Balding ST 04/05/17  BP (!) 153/117   Pulse (!) 115   Temp 98.5 F (36.9 C)   Ht 5\' 7"  (1.702 m)   Wt 122 lb (55.3 kg)   SpO2 100% Comment: room air  BMI 19.11 kg/m   His blood pressure is high today. He tells me that he has not taken his Blood pressure medicine for one week. He plans to pick up the medicine today.

## 2017-03-19 ENCOUNTER — Encounter: Payer: Self-pay | Admitting: Radiation Oncology

## 2017-03-19 ENCOUNTER — Encounter: Payer: Self-pay | Admitting: *Deleted

## 2017-03-19 ENCOUNTER — Ambulatory Visit
Admission: RE | Admit: 2017-03-19 | Discharge: 2017-03-19 | Disposition: A | Discharge status: Home / Self Care | Payer: Medicaid Other | Source: Ambulatory Visit | Attending: Radiation Oncology | Admitting: Radiation Oncology

## 2017-03-19 VITALS — BP 153/117 | HR 115 | Temp 98.5°F | Ht 67.0 in | Wt 122.0 lb

## 2017-03-19 DIAGNOSIS — Z08 Encounter for follow-up examination after completed treatment for malignant neoplasm: Secondary | ICD-10-CM | POA: Diagnosis not present

## 2017-03-19 DIAGNOSIS — Z923 Personal history of irradiation: Secondary | ICD-10-CM | POA: Insufficient documentation

## 2017-03-19 DIAGNOSIS — C062 Malignant neoplasm of retromolar area: Secondary | ICD-10-CM | POA: Diagnosis not present

## 2017-03-19 DIAGNOSIS — F17200 Nicotine dependence, unspecified, uncomplicated: Secondary | ICD-10-CM | POA: Diagnosis not present

## 2017-03-19 DIAGNOSIS — Z79899 Other long term (current) drug therapy: Secondary | ICD-10-CM | POA: Insufficient documentation

## 2017-03-19 DIAGNOSIS — Z79891 Long term (current) use of opiate analgesic: Secondary | ICD-10-CM | POA: Diagnosis not present

## 2017-03-19 DIAGNOSIS — Z931 Gastrostomy status: Secondary | ICD-10-CM | POA: Diagnosis not present

## 2017-03-19 DIAGNOSIS — R208 Other disturbances of skin sensation: Secondary | ICD-10-CM | POA: Diagnosis not present

## 2017-03-19 NOTE — Progress Notes (Signed)
Radiation Oncology         [phone] ________________________________  Name: Lawrence Arias MRN: [id number]  Date: 03/19/2017  DOB: 1960-11-03  Follow-Up Visit Note  CC: System, Provider Not In  Melissa Montane, MD  Diagnosis and Prior Radiotherapy:       ICD-10-CM   1. 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    Oral cancer ([place]) Staging form: Oral Cavity, AJCC 8th Edition - Clinical: Stage III (cT3, cN1, cM0) - Signed by Eppie Gibson, MD on 08/25/2016  CHIEF COMPLAINT:  Here for follow-up and surveillance of oral cancer  Radiation treatment dates:   12/31/2016 - 02/24/2016  Site/dose:  Left oral cavity and regional nodes in bilateral neck/ total dose 66 Gy in 33 fx  Narrative:  The patient returns today for routine follow-up.  He is accompanied by his friend today. He is only flushing the PEG tube and he is not consuming nutritional supplements through the tube.   Still smoking.  On review of systems, he reports burning sensation to his left neck and improved appetite in the last week. He was informed that the area was infected and he was prescribed abx by Dr. Lebron Conners. He denies fever and any other symptoms.     ALLERGIES:  has No Known Allergies.  Meds: Current Outpatient Medications  Medication Sig Dispense Refill  . acetaminophen (TYLENOL) 500 MG tablet Take 1,000 mg by mouth every 6 (six) hours as needed for moderate pain.    Marland Kitchen buPROPion (WELLBUTRIN SR) 150 MG 12 hr tablet Start one week before quit date. Take 1 tab daily x 3 days, then 1 tab BID thereafter. 60 tablet 2  . gabapentin (NEURONTIN) 300 MG capsule Take 1 capsule (300 mg total) by mouth 3 (three) times daily. To address cancer related pain. 90 capsule 0  . lidocaine (XYLOCAINE) 2 % solution Use as directed 20 mLs in the mouth or throat as needed for mouth pain. 100 mL 0  . nicotine (NICODERM CQ - DOSED IN MG/24 HOURS) 14 mg/24hr patch Place 1 patch (14 mg total) onto the skin daily. Apply  21 mg patch daily x 6 wk, then 14mg  patch daily x 2 wk, then 7 mg patch daily x 2 wk 14 patch 0  . nicotine (NICODERM CQ - DOSED IN MG/24 HOURS) 21 mg/24hr patch Place 1 patch (21 mg total) onto the skin daily. Apply 21 mg patch daily x 6 wk, then 14mg  patch daily x 2 wk, then 7 mg patch daily x 2 wk 14 patch 2  . nicotine (NICODERM CQ - DOSED IN MG/24 HR) 7 mg/24hr patch Place 1 patch (7 mg total) onto the skin daily. Apply 21 mg patch daily x 6 wk, then 14mg  patch daily x 2 wk, then 7 mg patch daily x 2 wk 14 patch 0  . amLODipine (NORVASC) 5 MG tablet Take 5 mg by mouth daily.  0  . fluconazole (DIFLUCAN) 200 MG tablet Take 1 tablet (200 mg total) by mouth daily for 10 days. (Patient not taking: Reported on 03/19/2017) 10 tablet 0  . oxyCODONE 10 MG TABS Take 1 tablet (10 mg total) by mouth every 4 (four) hours as needed for up to 14 days for severe pain. (Patient not taking: Reported on 03/19/2017) 50 tablet 0  . polyethylene glycol powder (MIRALAX) powder Use as directed for constipation. Follow directions on container. (Patient not taking: Reported on 03/19/2017) 850 g 3   No current facility-administered medications for this  encounter.     Physical Findings: The patient is in no acute distress. Patient is alert and oriented. Wt Readings from Last 3 Encounters:  03/19/17 122 lb (55.3 kg)  03/12/17 122 lb 11.2 oz (55.7 kg)  02/19/17 124 lb 9.6 oz (56.5 kg)    height is 5\' 7"  (1.702 m) and weight is 122 lb (55.3 kg). His temperature is 98.5 F (36.9 C). His blood pressure is 153/117 (abnormal) and his pulse is 115 (abnormal). His oxygen saturation is 100%. .  General: Alert and oriented, in no acute distress HEENT: Head is normocephalic. Extraocular movements are intact. Oropharynx is notable for  no sign of tumor recurrence in mouth. Mucous membranes moist. No thrush.  Neck: Neck is notable for trach collar intact. Two subcutaneous nodules in the posterior Level 2 region of the left neck which  are each about 1 cm in size. Firm, swollen, mass beneath his surgical scar which is about 4 finger breadths in width in the left neck.   Skin: Skin in treatment fields shows satisfactory healing  Lymphatics: see Neck Exam Psychiatric: Judgment and insight are intact. Affect is appropriate.   Lab Findings: Lab Results  Component Value Date   WBC 5.9 12/22/2016   HGB 11.5 (L) 12/22/2016   HCT 33.6 (L) 12/22/2016   MCV 97.4 12/22/2016   PLT 421 (H) 12/22/2016      Radiographic Findings: No results found.  Impression/Plan:    1) Head and Neck Cancer Status: at high risk for recurrence (his attendance to radiotherapy was erratic and hence this protracted the treatment course for advanced, high risk disease) with subcutaneous nodules that are concerning for progressive diease. The swelling at the surgical scar could be edema/scar tissue vs recurrence  2) Nutritional Status: N/A PEG tube: Yes, he is only flushing the tube and not using it for nutrition - keep for now  3) Risk Factors: The patient has been educated about risk factors including alcohol and tobacco abuse; they understand that avoidance of alcohol and tobacco is important to prevent recurrences as well as other cancers. Still smoking - again urged to quit  4) Swallowing: He is eating softer foods, eggs, grits, and oatmeal  5) Dental: Encouraged to continue regular followup with dentistry, and dental hygiene including fluoride rinses.   6) Thyroid function: check at next visit  7) Other: I will follow up with Dr. Hendricks Limes to ask for him to see pt soon for evaluation - we can order CT imaging to be done here or at [facility]  I advised the pt to use vitamin E oil or lotions with vitamin E oil to aid in alleviating radiation related skin changes to left neck. .  I spoke with the patient today regarding smoking cessation and how it can improve his quality of life.   8) Follow-up in 3 months. The patient was encouraged to call  with any issues or questions before then.  I spent 15 minutes minutes face to face with the patient and more than 50% of that time was spent in counseling and/or coordination of care. _____________________________________   Eppie Gibson, MD  This document serves as a record of services personally performed by Eppie Gibson, MD. It was created on her behalf by Steva Colder, a trained medical scribe. The creation of this record is based on the scribe's personal observations and the provider's statements to them. This document has been checked and approved by the attending provider.

## 2017-03-22 ENCOUNTER — Other Ambulatory Visit: Payer: Self-pay | Admitting: Radiation Oncology

## 2017-03-22 ENCOUNTER — Encounter: Payer: Self-pay | Admitting: Radiation Oncology

## 2017-03-22 DIAGNOSIS — C069 Malignant neoplasm of mouth, unspecified: Secondary | ICD-10-CM

## 2017-03-23 ENCOUNTER — Telehealth: Payer: Self-pay | Admitting: *Deleted

## 2017-03-23 NOTE — Telephone Encounter (Signed)
CALLED PATIENT TO INFORM OF STAT LABS ON 03-25-17 @ 1:15 PM @ [place] AND HIS CT ON 03-25-17 - ARRIVAL TIME - 2:15 PM @ [facility], PT. TO BE NPO- 4 HRS. PRIOR TO TEST, SPOKE WITH PATIENT AND HE IS AWARE OF THESE APPTS. AND HE IS GOOD WITH THEM

## 2017-03-23 NOTE — Progress Notes (Signed)
Oncology Nurse Navigator Documentation  To provide support, encouragement and care continuity, met with Mr. Zelada during post-tmt follow-up with Dr. Isidore Moos.  He completed adjuvant RT 02/23/17 to his left oral cavity and regional nodes.  He was accompanied by a friend. He reported improved appetite, notable increase in oral intake beginning last week. He stated he is not using button PEG for nutritional supplement but continues to flush daily. He indicated L neck swelling potential infection per Dr. Lebron Conners.  Dr. Isidore Moos suggested return to Dr. Hendricks Limes for further evaluation with STAT CT Neck.  She indicated she wd speak with Dr. Hendricks Limes and make referral.  He voiced understanding. Pt again encouraged to stop smoking.  He acknowleded. I encourage them to call me with needs/concerns.  [name], RN, BSN Head & Neck Oncology Nurse [place] [facility] [place] [phone]

## 2017-03-25 ENCOUNTER — Ambulatory Visit (HOSPITAL_COMMUNITY)
Admission: RE | Admit: 2017-03-25 | Discharge: 2017-03-25 | Disposition: A | Discharge status: Home / Self Care | Payer: Medicaid Other | Source: Ambulatory Visit | Attending: Radiation Oncology | Admitting: Radiation Oncology

## 2017-03-25 ENCOUNTER — Ambulatory Visit
Admission: RE | Admit: 2017-03-25 | Discharge: 2017-03-25 | Disposition: A | Discharge status: Home / Self Care | Payer: Medicaid Other | Source: Ambulatory Visit | Attending: Radiation Oncology | Admitting: Radiation Oncology

## 2017-03-25 DIAGNOSIS — R918 Other nonspecific abnormal finding of lung field: Secondary | ICD-10-CM | POA: Diagnosis not present

## 2017-03-25 DIAGNOSIS — I700 Atherosclerosis of aorta: Secondary | ICD-10-CM | POA: Diagnosis not present

## 2017-03-25 DIAGNOSIS — J438 Other emphysema: Secondary | ICD-10-CM | POA: Insufficient documentation

## 2017-03-25 DIAGNOSIS — C069 Malignant neoplasm of mouth, unspecified: Secondary | ICD-10-CM | POA: Diagnosis not present

## 2017-03-25 DIAGNOSIS — J432 Centrilobular emphysema: Secondary | ICD-10-CM | POA: Insufficient documentation

## 2017-03-25 DIAGNOSIS — R221 Localized swelling, mass and lump, neck: Secondary | ICD-10-CM | POA: Insufficient documentation

## 2017-03-25 DIAGNOSIS — Z08 Encounter for follow-up examination after completed treatment for malignant neoplasm: Secondary | ICD-10-CM | POA: Diagnosis not present

## 2017-03-25 LAB — BUN & CREATININE (CHCC)
BUN: 4 mg/dL — AB (ref 7–26)
Creatinine: 0.82 mg/dL (ref 0.70–1.30)

## 2017-03-25 MED ORDER — IOPAMIDOL (ISOVUE-300) INJECTION 61%
100.0000 mL | Freq: Once | INTRAVENOUS | Status: AC | PRN
  Administered 2017-03-25: 100 mL via INTRAVENOUS

## 2017-03-25 MED ORDER — IOPAMIDOL (ISOVUE-300) INJECTION 61%
INTRAVENOUS | Status: AC
  Filled 2017-03-25: qty 100

## 2017-03-25 MED ORDER — SODIUM CHLORIDE 0.9 % IJ SOLN
INTRAMUSCULAR | Status: AC
  Filled 2017-03-25: qty 50

## 2017-03-26 ENCOUNTER — Telehealth: Payer: Self-pay | Admitting: Hematology and Oncology

## 2017-03-26 ENCOUNTER — Encounter: Payer: Self-pay | Admitting: Hematology and Oncology

## 2017-03-26 ENCOUNTER — Other Ambulatory Visit: Payer: Self-pay

## 2017-03-26 ENCOUNTER — Inpatient Hospital Stay: Payer: Medicaid Other | Attending: Hematology and Oncology | Admitting: Hematology and Oncology

## 2017-03-26 VITALS — BP 154/96 | HR 108 | Temp 98.5°F | Resp 18 | Ht 67.0 in | Wt 120.7 lb

## 2017-03-26 DIAGNOSIS — F17210 Nicotine dependence, cigarettes, uncomplicated: Secondary | ICD-10-CM | POA: Insufficient documentation

## 2017-03-26 DIAGNOSIS — C062 Malignant neoplasm of retromolar area: Secondary | ICD-10-CM | POA: Diagnosis not present

## 2017-03-26 DIAGNOSIS — Z79899 Other long term (current) drug therapy: Secondary | ICD-10-CM | POA: Diagnosis not present

## 2017-03-26 DIAGNOSIS — Z923 Personal history of irradiation: Secondary | ICD-10-CM | POA: Insufficient documentation

## 2017-03-26 DIAGNOSIS — G893 Neoplasm related pain (acute) (chronic): Secondary | ICD-10-CM | POA: Diagnosis not present

## 2017-03-26 MED ORDER — OXYCODONE HCL 10 MG PO TABS: 10.0000 mg | ORAL_TABLET | ORAL | 0 refills | Status: AC | PRN

## 2017-03-26 MED ORDER — OXYCODONE HCL ER 10 MG PO T12A: 10.0000 mg | EXTENDED_RELEASE_TABLET | Freq: Two times a day (BID) | ORAL | 0 refills | Status: DC

## 2017-03-26 NOTE — Telephone Encounter (Signed)
Gave patient avs with appts per 2/8 los.

## 2017-04-02 NOTE — Assessment & Plan Note (Signed)
57 y.o.   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  Patient was supposed to be on systemic chemotherapy, but did not follow-up with any of the scheduled appointments in the beginning of the treatment necessitating discontinuation of the systemic therapy plan.  Later in the course of radiation, patient returned requesting initiation of systemic therapy, but my opinion, the benefit was minimal at that point and I did not proceed with any systemic treatment considering patient had less than a week left of radiation at that time.  Patient presents to the clinic today with complaints of oral and throat pain.  Examinations consistent with development of thrush.  Plan: -Continue oxycodone 10 mg every 4 hours as needed, patient can supplement with acetaminophen 1000 mg 3 times a day as needed -Start fluconazole 200 mg daily times 10 days -Return to my clinic in 2 weeks for continued symptom management.

## 2017-04-02 NOTE — Progress Notes (Signed)
[facility] Follow-up Visit:  Assessment: Squamous cell cancer of retromolar trigone ([place]) 57 y.o.   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  Patient was supposed to be on systemic chemotherapy, but did not follow-up with any of the scheduled appointments in the beginning of the treatment necessitating discontinuation of the systemic therapy plan.  Later in the course of radiation, patient returned requesting initiation of systemic therapy, but my opinion, the benefit was minimal at that point and I did not proceed with any systemic treatment considering patient had less than a week left of radiation at that time.  Patient presents to the clinic today with complaints of oral and throat pain.  Examinations consistent with development of thrush.  Plan: -Continue oxycodone 10 mg every 4 hours as needed, patient can supplement with acetaminophen 1000 mg 3 times a day as needed -Start fluconazole 200 mg daily times 10 days -Return to my clinic in 2 weeks for continued symptom management.  Voice recognition software was used and creation of this note. Despite my best effort at editing the text, some misspelling/errors may have occurred.  No orders of the defined types were placed in this encounter.   Cancer Staging Oral cancer [facility]) Staging form: Oral Cavity, AJCC 8th Edition - Clinical: Stage III (cT3, cN1, cM0) - Signed by Eppie Gibson, MD on 08/25/2016  Squamous cell cancer of retromolar trigone ([place]) Staging form: Oral Cavity, AJCC 8th Edition - Pathologic stage from 10/16/2016: Stage IVB (pT2, pN3b, cM0) - Unsigned   All questions were answered.  . The patient knows to call the clinic with any problems, questions or concerns.  This note was electronically signed.    History of Presenting Illness Lawrence Arias is a 57 y.o. 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 Since the diagnosis in Jul 2018, patient underwent surgery on 10/16/16 including graft tissue placement. Recovered well from the operation, and he is referred to medical oncology for possible systemic chemotherapy as an adjunctive for the adjuvant radiation therapy. His primary ENT is Dr. Janace Hoard and Radiation Oncologist is Dr Isidore Moos.  Due to concerns for residual/recurrent disease in the surgical bed, patient was recommended to undergo chemoradiotherapy and was planned for the treatment in mid October 2018.  Nevertheless, he did not show up for several appointments with our clinic and eventually we made decision to forego chemotherapy.  Patient is nearing completion of his radiation therapy and tolerated it quite well so far.   Patient returns to the clinic due to complaints of increased oral pain.  Denies any interval fever, chills, night sweats.  No progressive swelling in the neck or swallowing difficulties.   Oncological/hematological History:   Squamous cell cancer of retromolar trigone ([place])   07/24/2016 Initial Diagnosis    Squamous cell cancer of retromolar trigone [facility]): Initial biopsy demonstrating moderately differentiated invasive squamous cell carcinoma.       08/14/2016 Imaging    CT neck: Nonvisualization of left retromolar trigone mucosal lesion. No evidence of invasion into the adjacent structures. 2. Left level IIa lymph node measuring 1 cm, likely indicating spread of disease. No contralateral adenopathy.      09/29/2016 Imaging    CT Neck [facility]): Left-sided pathological lymphadenopathy of metastatic disease at level IB, IIA. Possible involvement of level IA, level III, and level IV on the left side.  10/16/2016 Surgery    Dr Harriett Rush [facility] [place]  [facility]): LEFT TONSIL, SOFT PALATE, FLOOR OF MOUTH, BASE OF TONGUE, AND MANDIBLE, PARTIAL GLOSSOPHARYNGECTOMY AND PARTIAL MANDIBULECTOMY, LEFT PARAPHARYNGEAL MASS,  EXCISION, TEETH, REMOVAL       10/16/2016 Pathology Results    Surgical Pathology:  LEFT TONSIL, SOFT PALATE, FLOOR OF MOUTH, BASE OF TONGUE, AND MANDIBLE, PARTIAL GLOSSOPHARYNGECTOMY AND PARTIAL MANDIBULECTOMY: Invasive squamous cell carcinoma, keratinizing type, moderately differentiated, is identified forming a 3.7x1.8x1.0cm mass in the left retromolar area.Tumor invades to a depth of 0.8 cm.Lymphovascular invasion is identified. Perineural invasion is identified. The medial soft tissue margin is positive for invasive squamous cell carcinoma (focal). LEFT PARAPHARYNGEAL MASS, EXCISION: Invasive squamous cell carcinoma forming a 1.2x1.2x0.5cm mass. Invasive carcinoma involves the unoriented soft tissue margin over a linear length of 0.3 cm. TEETH, REMOVAL: Two teeth (gross examination only).  Margins: "TUMOR NEXT TO BONE", EXCISION: Scant fragments of bone and dense fibrous tissue, negative for carcinoma. "PHARYNGEAL WALL MARGIN", EXCISION: Squamous mucosa with acute inflammation and skeletal muscle, negative for carcinoma. "HARD PALATE MARGIN", EXCISION: Squamous mucosa, negative for carcinoma. "TONGUE BASE MARGIN", EXCISION: Squamous mucosa with chronic inflammation, negative for carcinoma. "INFERIOR BUCCAL MUCOSA MARGIN", EXCISION: Squamous mucosa and skeletal muscle, negative for carcinoma. "SUPERIOR BUCCAL MUCOSAL MARGIN", EXCISION: Squamous mucosa with chronic inflammation, negative for carcinoma.  Lymph nodes:  LEVEL 1A NECK CONTENTS, LYMPH NODE DISSECTION: Three lymph nodes, negative for metastatic carcinoma (0/3). LEFT LEVEL 1B NECK CONTENTS, LYMPH NODE DISSECTION: Metastatic carcinoma in two of five lymph nodes (2/5). Largest metastatic deposit measures 2.4 cm. Definitive extranodal extension is not identified. Salivary gland parenchyma, negative for tumor. LEFT LEVEL 2B NECK CONTENTS, LYMPH NODE DISSECTION: Eight lymph nodes, negative for metastatic carcinoma (0/8). LEFT LEVEL 2A, 3,  4, AND 5 NECK CONTENTS, LYMPH NODE DISSECTION: Metastatic carcinoma in one of twenty-two lymph nodes (1/22). Largest metastatic deposit measures 3.5 cm. Extranodal extension is present and measures 1.5 mm beyond the lymph node capsule.  COMMENT: Immunohistochemical staining for p16 (Block A4) shows the invasive carcinoma is diffusely positive for p16.The positive control stains appropriately.      11/04/2016 Imaging    CT Neck [facility]): No pathological lymphadenopathy. Surgical changes noted. Significant fluid collection in the left upper neck with internal gas and significant soft tissue swelling.      11/30/2016 Imaging    CT Neck: Postop resection of retromolar trigone carcinoma on the left with free flap. Postsurgical changes left neck without evidence of recurrent tumor.   CT Chest: No suspicious pulmonary nodules or masses identified. No thoracic adenopathy.      12/31/2016 - 02/23/2017 Radiation Therapy    Left oral cavity and regional nodes: --66Gy/33 fx         Medical History: Past Medical History:  Diagnosis Date  . Cancer ([place])   . GERD (gastroesophageal reflux disease)    uses OTC prn  . History of radiation therapy 12/31/16- 02/23/2017   Left oral cavity and regional nodes, 66 Gy in 33 fractions.   . Hypertension   . Tongue lesion     Surgical History: Past Surgical History:  Procedure Laterality Date  . DIRECT LARYNGOSCOPY N/A 07/24/2016   Procedure: DIRECT LARYNGOSCOPY;  Surgeon: Melissa Montane, MD;  Location: [place];  Service: ENT;  Laterality: N/A;  Direct laryngoscopy with biopsy tongue lesion  . FRACTURE SURGERY     left wrist, remote  . FREE FLAP RADIAL FOREARM Left 10/16/2016   Dr. Hendricks Limes -[facility]  [facility] hospital  . free flap scapular Left 10/16/2016   Dr. Hendricks Limes - [facility] hospital  . GASTROSTOMY  10/27/2016   Dr. Daphene Calamity- [facility] hospital.   . IR FLUORO GUIDE PORT INSERTION RIGHT  12/22/2016  . IR US  GUIDE VASC ACCESS RIGHT  12/22/2016  . NECK DISSECTION Bilateral 10/16/2016   Selective neck dissection, Dr. Hendricks Limes [facility]  . 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 Hendricks Limes [facility]  . SKIN GRAFT Left 10/16/2016   Split thickness skin graft, leg Dr. Hendricks Limes. [facility]  . TONGUE BIOPSY  07/24/2016   Procedure: TONGUE BIOPSY;  Surgeon: Melissa Montane, MD;  Location: [place];  Service: ENT;;  . TRACHEOSTOMY  10/16/2016   Dr. Hendricks Limes [facility]    Family History: Family History  Problem Relation Age of Onset  . Hypertension Mother   . Hypertension Father     Social History: Social History   Socioeconomic History  . Marital status: Single    Spouse name: Not on file  . Number of children: 3  . Years of education: Not on file  . Highest education level: Not on file  Social Needs  . Financial resource strain: Not on file  . Food insecurity - worry: Not on file  . Food insecurity - inability: Not on file  . Transportation needs - medical: Not on file  . Transportation needs - non-medical: Not on file  Occupational History  . Not on file  Tobacco Use  . Smoking status: Current Some Day Smoker    Packs/day: 0.50    Types: Cigarettes  . Smokeless tobacco: Never Used  . Tobacco comment: he is smoking 0ne every other day (11/20/16)  Substance and Sexual Activity  . Alcohol use: No  . Drug use: No  . Sexual activity: Not on file  Other Topics Concern  . Not on file  Social History Narrative  . Not on file    Allergies: No Known Allergies  Medications:  Current Outpatient Medications  Medication Sig Dispense Refill  . acetaminophen (TYLENOL) 500 MG tablet Take 1,000 mg by mouth every 6 (six) hours as needed for moderate pain.    Marland Kitchen amLODipine (NORVASC) 5 MG tablet Take 5 mg by mouth daily.  0  . buPROPion (WELLBUTRIN SR) 150 MG 12 hr tablet Start one week before quit date. Take 1 tab daily x 3 days, then 1 tab BID thereafter. 60 tablet 2  . gabapentin  (NEURONTIN) 300 MG capsule Take 1 capsule (300 mg total) by mouth 3 (three) times daily. To address cancer related pain. 90 capsule 0  . lidocaine (XYLOCAINE) 2 % solution Use as directed 20 mLs in the mouth or throat as needed for mouth pain. 100 mL 0  . nicotine (NICODERM CQ - DOSED IN MG/24 HOURS) 14 mg/24hr patch Place 1 patch (14 mg total) onto the skin daily. Apply 21 mg patch daily x 6 wk, then 14mg  patch daily x 2 wk, then 7 mg patch daily x 2 wk 14 patch 0  . nicotine (NICODERM CQ - DOSED IN MG/24 HOURS) 21 mg/24hr patch Place 1 patch (21 mg total) onto the skin daily. Apply 21 mg patch daily x 6 wk, then 14mg  patch daily x 2 wk, then 7 mg patch daily x 2 wk 14 patch 2  . nicotine (NICODERM CQ - DOSED IN MG/24 HR) 7 mg/24hr patch Place 1 patch (7 mg total) onto the skin daily. Apply 21  mg patch daily x 6 wk, then 14mg  patch daily x 2 wk, then 7 mg patch daily x 2 wk 14 patch 0  . oxyCODONE (OXYCONTIN) 10 mg 12 hr tablet Take 1 tablet (10 mg total) by mouth every 12 (twelve) hours. 60 tablet 0  . Oxycodone HCl 10 MG TABS Take 1 tablet (10 mg total) by mouth every 4 (four) hours as needed for up to 14 days. 50 tablet 0  . polyethylene glycol powder (MIRALAX) powder Use as directed for constipation. Follow directions on container. (Patient not taking: Reported on 03/19/2017) 850 g 3   No current facility-administered medications for this visit.     Review of Systems: Review of Systems  All other systems reviewed and are negative.    PHYSICAL EXAMINATION Blood pressure (!) 169/116, pulse (!) 118, temperature 98.6 F (37 C), temperature source Oral, resp. rate 17, height 5\' 7"  (1.702 m), weight 122 lb 11.2 oz (55.7 kg), SpO2 99 %.  ECOG PERFORMANCE STATUS: 2 - Symptomatic, <50% confined to bed  Physical Exam  Constitutional: He is oriented to person, place, and time and well-developed, well-nourished, and in no distress. No distress.  HENT:  New whitish patches noted in the oral cavity  and oropharynx consistent with thrush.Surgical incisions are well-healed. Left buccal mucosal surface replaced with a graft tissue. No obvious visible cancer.  Eyes: EOM are normal. Pupils are equal, round, and reactive to light. No scleral icterus.  Neck: Normal range of motion. Neck supple. No thyromegaly present.  Cardiovascular: Normal rate, regular rhythm and intact distal pulses.  No murmur heard. Pulmonary/Chest: Effort normal and breath sounds normal. No respiratory distress. He has no wheezes. He has no rales.  Abdominal: Soft. Bowel sounds are normal. He exhibits no distension and no mass. There is no tenderness. There is no rebound and no guarding.  Musculoskeletal: Normal range of motion. He exhibits no edema.  Lymphadenopathy:    He has no cervical adenopathy.  Neurological: He is alert and oriented to person, place, and time. He has normal reflexes. No cranial nerve deficit. Coordination normal.  Skin: Skin is dry. No rash noted. He is not diaphoretic. No erythema. No pallor.     LABORATORY DATA: I have personally reviewed the data as listed: No visits with results within 1 Week(s) from this visit.  Latest known visit with results is:  Hospital Outpatient Visit on 12/22/2016  Component Date Value Ref Range Status  . WBC 12/22/2016 5.9  4.0 - 10.5 K/uL Final  . RBC 12/22/2016 3.45* 4.22 - 5.81 MIL/uL Final  . Hemoglobin 12/22/2016 11.5* 13.0 - 17.0 g/dL Final  . HCT 12/22/2016 33.6* 39.0 - 52.0 % Final  . MCV 12/22/2016 97.4  78.0 - 100.0 fL Final  . MCH 12/22/2016 33.3  26.0 - 34.0 pg Final  . MCHC 12/22/2016 34.2  30.0 - 36.0 g/dL Final  . RDW 12/22/2016 15.9* 11.5 - 15.5 % Final  . Platelets 12/22/2016 421* 150 - 400 K/uL Final  . Prothrombin Time 12/22/2016 15.8* 11.4 - 15.2 seconds Final  . INR 12/22/2016 1.27   Final       Ardath Sax, MD

## 2017-04-05 ENCOUNTER — Ambulatory Visit: Payer: Medicaid Other | Attending: Radiation Oncology

## 2017-04-06 ENCOUNTER — Other Ambulatory Visit: Payer: Self-pay

## 2017-04-06 ENCOUNTER — Ambulatory Visit: Payer: Self-pay | Admitting: Hematology and Oncology

## 2017-04-12 ENCOUNTER — Telehealth: Payer: Self-pay | Admitting: *Deleted

## 2017-04-12 NOTE — Telephone Encounter (Signed)
Oncology Nurse Navigator Documentation  Called pt in follow-up to 2/19 no show with Dr. Lebron Conners, LVMM requested call-back to reschedule.  [name], RN, BSN Head & Neck Oncology Nurse [place] [facility] [place] [phone]

## 2017-04-18 ENCOUNTER — Encounter: Payer: Self-pay | Admitting: Hematology and Oncology

## 2017-04-18 NOTE — Progress Notes (Signed)
[facility] Follow-up Visit:  Assessment: Squamous cell cancer of retromolar trigone ([place]) 57 y.o.   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  Patient was supposed to be on systemic chemotherapy, but did not follow-up with any of the scheduled appointments in the beginning of the treatment necessitating discontinuation of the systemic therapy plan.  Later in the course of radiation, patient returned requesting initiation of systemic therapy, but my opinion, the benefit was minimal at that point and I did not proceed with any systemic treatment considering patient had less than a week left of radiation at that time.  Patient presents to the clinic today with complaints of oral and throat pain.  Restaging imaging of neck and chest is suggestive of possible disease recurrence.  Tissue confirmation would be needed before proceeding with discussions of possible salvage therapy.  Plan: -Continue oxycodone 10 mg every 4 hours as needed, patient can supplement with acetaminophen 1000 mg 3 times a day as needed -Consult ENT for biopsy of the cervical mass. -Return to my clinic in 2 weeks with lab work for discussion of possible palliative systemic therapy if recurrent disease is confirmed.  Voice recognition software was used and creation of this note. Despite my best effort at editing the text, some misspelling/errors may have occurred.  Orders Placed This Encounter  Procedures  . CBC with Differential (Cancer Center Only)    Standing Status:   Future    Standing Expiration Date:   03/26/2018  . CMP ([facility] only)    Standing Status:   Future    Standing Expiration Date:   03/26/2018  . Magnesium    Standing Status:   Future    Standing Expiration Date:   03/26/2018  . Phosphorus    Standing Status:   Future    Standing Expiration Date:   03/26/2018    Cancer Staging Oral cancer  [facility]) Staging form: Oral Cavity, AJCC 8th Edition - Clinical: Stage III (cT3, cN1, cM0) - Signed by Eppie Gibson, MD on 08/25/2016  Squamous cell cancer of retromolar trigone ([place]) Staging form: Oral Cavity, AJCC 8th Edition - Pathologic stage from 10/16/2016: Stage IVB (pT2, pN3b, cM0) - Unsigned   All questions were answered.  . The patient knows to call the clinic with any problems, questions or concerns.  This note was electronically signed.    History of Presenting Illness Lawrence Arias is a 57 y.o. 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 Since the diagnosis in Jul 2018, patient underwent surgery on 10/16/16 including graft tissue placement. Recovered well from the operation, and he is referred to medical oncology for possible systemic chemotherapy as an adjunctive for the adjuvant radiation therapy. His primary ENT is Dr. Janace Hoard and Radiation Oncologist is Dr Isidore Moos.  Due to concerns for residual/recurrent disease in the surgical bed, patient was recommended to undergo chemoradiotherapy and was planned for the treatment in mid October 2018.  Nevertheless, he did not show up for several appointments with our clinic and eventually we made decision to forego chemotherapy.  Patient is nearing completion of his radiation therapy and tolerated it quite well so far.   Patient returns to the clinic due to complaints of increased oral pain.  In the interim, underwent imaging of the neck and chest outlined below.  Has not been seen by ENT yet.  No chest pain, shortness of breath,  or cough.   Oncological/hematological History:   Squamous cell cancer of retromolar trigone ([place])   07/24/2016 Initial Diagnosis    Squamous cell cancer of retromolar trigone [facility]): Initial biopsy demonstrating moderately differentiated invasive squamous cell carcinoma.       08/14/2016 Imaging    CT neck: Nonvisualization of left retromolar trigone  mucosal lesion. No evidence of invasion into the adjacent structures. 2. Left level IIa lymph node measuring 1 cm, likely indicating spread of disease. No contralateral adenopathy.      09/29/2016 Imaging    CT Neck [facility]): Left-sided pathological lymphadenopathy of metastatic disease at level IB, IIA. Possible involvement of level IA, level III, and level IV on the left side.      10/16/2016 Surgery    Dr Harriett Rush Sunny Schlein [place]  [facility]): LEFT TONSIL, SOFT PALATE, FLOOR OF MOUTH, BASE OF TONGUE, AND MANDIBLE, PARTIAL GLOSSOPHARYNGECTOMY AND PARTIAL MANDIBULECTOMY, LEFT PARAPHARYNGEAL MASS, EXCISION, TEETH, REMOVAL       10/16/2016 Pathology Results    Surgical Pathology:  LEFT TONSIL, SOFT PALATE, FLOOR OF MOUTH, BASE OF TONGUE, AND MANDIBLE, PARTIAL GLOSSOPHARYNGECTOMY AND PARTIAL MANDIBULECTOMY: Invasive squamous cell carcinoma, keratinizing type, moderately differentiated, is identified forming a 3.7x1.8x1.0cm mass in the left retromolar area.Tumor invades to a depth of 0.8 cm.Lymphovascular invasion is identified. Perineural invasion is identified. The medial soft tissue margin is positive for invasive squamous cell carcinoma (focal). LEFT PARAPHARYNGEAL MASS, EXCISION: Invasive squamous cell carcinoma forming a 1.2x1.2x0.5cm mass. Invasive carcinoma involves the unoriented soft tissue margin over a linear length of 0.3 cm. TEETH, REMOVAL: Two teeth (gross examination only).  Margins: "TUMOR NEXT TO BONE", EXCISION: Scant fragments of bone and dense fibrous tissue, negative for carcinoma. "PHARYNGEAL WALL MARGIN", EXCISION: Squamous mucosa with acute inflammation and skeletal muscle, negative for carcinoma. "HARD PALATE MARGIN", EXCISION: Squamous mucosa, negative for carcinoma. "TONGUE BASE MARGIN", EXCISION: Squamous mucosa with chronic inflammation, negative for carcinoma. "INFERIOR BUCCAL MUCOSA MARGIN", EXCISION: Squamous mucosa and skeletal muscle, negative for carcinoma.  "SUPERIOR BUCCAL MUCOSAL MARGIN", EXCISION: Squamous mucosa with chronic inflammation, negative for carcinoma.  Lymph nodes:  LEVEL 1A NECK CONTENTS, LYMPH NODE DISSECTION: Three lymph nodes, negative for metastatic carcinoma (0/3). LEFT LEVEL 1B NECK CONTENTS, LYMPH NODE DISSECTION: Metastatic carcinoma in two of five lymph nodes (2/5). Largest metastatic deposit measures 2.4 cm. Definitive extranodal extension is not identified. Salivary gland parenchyma, negative for tumor. LEFT LEVEL 2B NECK CONTENTS, LYMPH NODE DISSECTION: Eight lymph nodes, negative for metastatic carcinoma (0/8). LEFT LEVEL 2A, 3, 4, AND 5 NECK CONTENTS, LYMPH NODE DISSECTION: Metastatic carcinoma in one of twenty-two lymph nodes (1/22). Largest metastatic deposit measures 3.5 cm. Extranodal extension is present and measures 1.5 mm beyond the lymph node capsule.  COMMENT: Immunohistochemical staining for p16 (Block A4) shows the invasive carcinoma is diffusely positive for p16.The positive control stains appropriately.      11/04/2016 Imaging    CT Neck [facility]): No pathological lymphadenopathy. Surgical changes noted. Significant fluid collection in the left upper neck with internal gas and significant soft tissue swelling.      11/30/2016 Imaging    CT Neck: Postop resection of retromolar trigone carcinoma on the left with free flap. Postsurgical changes left neck without evidence of recurrent tumor.   CT Chest: No suspicious pulmonary nodules or masses identified. No thoracic adenopathy.      12/31/2016 - 02/23/2017 Radiation Therapy    Left oral cavity and regional nodes: --66Gy/33 fx        03/25/2017 Relapse/Recurrence  CT Neck & Chest: Likely recurrent disease in the neck with left submandibular mass measuring 3.0 cm.  Multiple new small pulmonary nodules worrisome for possible metastatic disease.       Medical History: Past Medical History:  Diagnosis Date  . Cancer ([place])   . GERD  (gastroesophageal reflux disease)    uses OTC prn  . History of radiation therapy 12/31/16- 02/23/2017   Left oral cavity and regional nodes, 66 Gy in 33 fractions.   . Hypertension   . Tongue lesion     Surgical History: Past Surgical History:  Procedure Laterality Date  . DIRECT LARYNGOSCOPY N/A 07/24/2016   Procedure: DIRECT LARYNGOSCOPY;  Surgeon: Melissa Montane, MD;  Location: [place];  Service: ENT;  Laterality: N/A;  Direct laryngoscopy with biopsy tongue lesion  . FRACTURE SURGERY     left wrist, remote  . FREE FLAP RADIAL FOREARM Left 10/16/2016   Dr. Hendricks Limes -[facility]  . free flap scapular Left 10/16/2016   Dr. Hendricks Limes [facility]  . GASTROSTOMY  10/27/2016   Dr. Daphene Calamity- [facility] hospital.   . IR FLUORO GUIDE PORT INSERTION RIGHT  12/22/2016  . IR US GUIDE VASC ACCESS RIGHT  12/22/2016  . NECK DISSECTION Bilateral 10/16/2016   Selective neck dissection, Dr. Hendricks Limes [facility]  . 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 Hendricks Limes [facility]  . SKIN GRAFT Left 10/16/2016   Split thickness skin graft, leg Dr. Hendricks Limes. [facility]  . TONGUE BIOPSY  07/24/2016   Procedure: TONGUE BIOPSY;  Surgeon: Melissa Montane, MD;  Location: [place];  Service: ENT;;  . TRACHEOSTOMY  10/16/2016   Dr. Hendricks Limes [facility]    Family History: Family History  Problem Relation Age of Onset  . Hypertension Mother   . Hypertension Father     Social History: Social History   Socioeconomic History  . Marital status: Single    Spouse name: Not on file  . Number of children: 3  . Years of education: Not on file  . Highest education level: Not on file  Social Needs  . Financial resource strain: Not on file  . Food insecurity - worry: Not on file  . Food insecurity - inability: Not on file  . Transportation needs - medical: Not on file  . Transportation needs - non-medical: Not on file  Occupational History  . Not on file   Tobacco Use  . Smoking status: Current Some Day Smoker    Packs/day: 0.50    Types: Cigarettes  . Smokeless tobacco: Never Used  . Tobacco comment: he is smoking 0ne every other day (11/20/16)  Substance and Sexual Activity  . Alcohol use: No  . Drug use: No  . Sexual activity: Not on file  Other Topics Concern  . Not on file  Social History Narrative  . Not on file    Allergies: No Known Allergies  Medications:  Current Outpatient Medications  Medication Sig Dispense Refill  . acetaminophen (TYLENOL) 500 MG tablet Take 1,000 mg by mouth every 6 (six) hours as needed for moderate pain.    Marland Kitchen amLODipine (NORVASC) 5 MG tablet Take 5 mg by mouth daily.  0  . buPROPion (WELLBUTRIN SR) 150 MG 12 hr tablet Start one week before quit date. Take 1 tab daily x 3 days, then 1 tab BID thereafter. 60 tablet 2  . gabapentin (NEURONTIN) 300 MG capsule Take 1 capsule (300 mg total)  by mouth 3 (three) times daily. To address cancer related pain. 90 capsule 0  . lidocaine (XYLOCAINE) 2 % solution Use as directed 20 mLs in the mouth or throat as needed for mouth pain. 100 mL 0  . nicotine (NICODERM CQ - DOSED IN MG/24 HOURS) 14 mg/24hr patch Place 1 patch (14 mg total) onto the skin daily. Apply 21 mg patch daily x 6 wk, then '14mg'$  patch daily x 2 wk, then 7 mg patch daily x 2 wk 14 patch 0  . nicotine (NICODERM CQ - DOSED IN MG/24 HOURS) 21 mg/24hr patch Place 1 patch (21 mg total) onto the skin daily. Apply 21 mg patch daily x 6 wk, then '14mg'$  patch daily x 2 wk, then 7 mg patch daily x 2 wk 14 patch 2  . nicotine (NICODERM CQ - DOSED IN MG/24 HR) 7 mg/24hr patch Place 1 patch (7 mg total) onto the skin daily. Apply 21 mg patch daily x 6 wk, then '14mg'$  patch daily x 2 wk, then 7 mg patch daily x 2 wk 14 patch 0  . oxyCODONE (OXYCONTIN) 10 mg 12 hr tablet Take 1 tablet (10 mg total) by mouth every 12 (twelve) hours. 60 tablet 0  . polyethylene glycol powder (MIRALAX) powder Use as directed for  constipation. Follow directions on container. (Patient not taking: Reported on 03/19/2017) 850 g 3   No current facility-administered medications for this visit.     Review of Systems: Review of Systems  All other systems reviewed and are negative.    PHYSICAL EXAMINATION Blood pressure (!) 154/96, pulse (!) 108, temperature 98.5 F (36.9 C), temperature source Oral, resp. rate 18, height '5\' 7"'$  (1.702 m), weight 120 lb 11.2 oz (54.7 kg), SpO2 100 %.  ECOG PERFORMANCE STATUS: 2 - Symptomatic, <50% confined to bed  Physical Exam  Constitutional: He is oriented to person, place, and time and well-developed, well-nourished, and in no distress. No distress.  HENT:  New whitish patches noted in the oral cavity and oropharynx consistent with thrush.Surgical incisions are well-healed. Left buccal mucosal surface replaced with a graft tissue. No obvious visible cancer.  Eyes: EOM are normal. Pupils are equal, round, and reactive to light. No scleral icterus.  Neck: Normal range of motion. Neck supple. No thyromegaly present.  Cardiovascular: Normal rate, regular rhythm and intact distal pulses.  No murmur heard. Pulmonary/Chest: Effort normal and breath sounds normal. No respiratory distress. He has no wheezes. He has no rales.  Abdominal: Soft. Bowel sounds are normal. He exhibits no distension and no mass. There is no tenderness. There is no rebound and no guarding.  Musculoskeletal: Normal range of motion. He exhibits no edema.  Lymphadenopathy:    He has no cervical adenopathy.  Neurological: He is alert and oriented to person, place, and time. He has normal reflexes. No cranial nerve deficit. Coordination normal.  Skin: Skin is dry. No rash noted. He is not diaphoretic. No erythema. No pallor.     LABORATORY DATA: I have personally reviewed the data as listed: Hospital Outpatient Visit on 03/25/2017  Component Date Value Ref Range Status  . BUN 03/25/2017 4* 7 - 26 mg/dL Final  .  Creatinine 03/25/2017 0.82  0.70 - 1.30 mg/dL Final  . GFR, Est Non Af Am 03/25/2017 >60  >60 mL/min Final  . GFR, Est AFR Am 03/25/2017 >60  >60 mL/min Final   Comment: (NOTE) The eGFR has been calculated using the CKD EPI equation. This calculation has not been  validated in all clinical situations. eGFR's persistently <60 mL/min signify possible Chronic Kidney Disease. Performed at [facility], [place] [address] [place], [place] [address]        Ardath Sax, MD

## 2017-04-18 NOTE — Assessment & Plan Note (Signed)
57 y.o.   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  Patient was supposed to be on systemic chemotherapy, but did not follow-up with any of the scheduled appointments in the beginning of the treatment necessitating discontinuation of the systemic therapy plan.  Later in the course of radiation, patient returned requesting initiation of systemic therapy, but my opinion, the benefit was minimal at that point and I did not proceed with any systemic treatment considering patient had less than a week left of radiation at that time.  Patient presents to the clinic today with complaints of oral and throat pain.  Restaging imaging of neck and chest is suggestive of possible disease recurrence.  Tissue confirmation would be needed before proceeding with discussions of possible salvage therapy.  Plan: -Continue oxycodone 10 mg every 4 hours as needed, patient can supplement with acetaminophen 1000 mg 3 times a day as needed -Consult ENT for biopsy of the cervical mass. -Return to my clinic in 2 weeks with lab work for discussion of possible palliative systemic therapy if recurrent disease is confirmed.

## 2017-04-19 ENCOUNTER — Ambulatory Visit: Payer: Medicaid Other | Attending: Radiation Oncology

## 2017-04-19 DIAGNOSIS — R1310 Dysphagia, unspecified: Secondary | ICD-10-CM | POA: Insufficient documentation

## 2017-04-19 DIAGNOSIS — M436 Torticollis: Secondary | ICD-10-CM | POA: Insufficient documentation

## 2017-04-19 DIAGNOSIS — L599 Disorder of the skin and subcutaneous tissue related to radiation, unspecified: Secondary | ICD-10-CM | POA: Insufficient documentation

## 2017-04-19 DIAGNOSIS — R29898 Other symptoms and signs involving the musculoskeletal system: Secondary | ICD-10-CM | POA: Insufficient documentation

## 2017-04-19 DIAGNOSIS — C062 Malignant neoplasm of retromolar area: Secondary | ICD-10-CM | POA: Insufficient documentation

## 2017-04-19 DIAGNOSIS — M25612 Stiffness of left shoulder, not elsewhere classified: Secondary | ICD-10-CM | POA: Insufficient documentation

## 2017-04-19 DIAGNOSIS — R471 Dysarthria and anarthria: Secondary | ICD-10-CM | POA: Insufficient documentation

## 2017-04-19 DIAGNOSIS — I890 Lymphedema, not elsewhere classified: Secondary | ICD-10-CM | POA: Insufficient documentation

## 2017-04-21 ENCOUNTER — Telehealth: Payer: Self-pay | Admitting: *Deleted

## 2017-04-22 ENCOUNTER — Telehealth: Payer: Self-pay | Admitting: Hematology and Oncology

## 2017-04-22 ENCOUNTER — Encounter: Payer: Self-pay | Admitting: Hematology and Oncology

## 2017-04-22 ENCOUNTER — Inpatient Hospital Stay: Payer: Medicaid Other | Attending: Hematology and Oncology | Admitting: Hematology and Oncology

## 2017-04-22 ENCOUNTER — Encounter: Payer: Self-pay | Admitting: *Deleted

## 2017-04-22 ENCOUNTER — Telehealth: Payer: Self-pay | Admitting: *Deleted

## 2017-04-22 VITALS — BP 153/117 | HR 105 | Temp 97.6°F | Resp 17 | Ht 67.0 in | Wt 115.0 lb

## 2017-04-22 DIAGNOSIS — Z79899 Other long term (current) drug therapy: Secondary | ICD-10-CM | POA: Insufficient documentation

## 2017-04-22 DIAGNOSIS — R070 Pain in throat: Secondary | ICD-10-CM | POA: Insufficient documentation

## 2017-04-22 DIAGNOSIS — J029 Acute pharyngitis, unspecified: Secondary | ICD-10-CM | POA: Diagnosis not present

## 2017-04-22 DIAGNOSIS — Z930 Tracheostomy status: Secondary | ICD-10-CM | POA: Insufficient documentation

## 2017-04-22 DIAGNOSIS — F17210 Nicotine dependence, cigarettes, uncomplicated: Secondary | ICD-10-CM | POA: Insufficient documentation

## 2017-04-22 DIAGNOSIS — E876 Hypokalemia: Secondary | ICD-10-CM | POA: Insufficient documentation

## 2017-04-22 DIAGNOSIS — G893 Neoplasm related pain (acute) (chronic): Secondary | ICD-10-CM

## 2017-04-22 DIAGNOSIS — C062 Malignant neoplasm of retromolar area: Secondary | ICD-10-CM | POA: Diagnosis not present

## 2017-04-22 DIAGNOSIS — Z923 Personal history of irradiation: Secondary | ICD-10-CM | POA: Diagnosis not present

## 2017-04-22 DIAGNOSIS — Z5111 Encounter for antineoplastic chemotherapy: Secondary | ICD-10-CM | POA: Diagnosis present

## 2017-04-22 DIAGNOSIS — R530 Neoplastic (malignant) related fatigue: Secondary | ICD-10-CM

## 2017-04-22 MED ORDER — OXYCODONE HCL ER 10 MG PO T12A: 10.0000 mg | EXTENDED_RELEASE_TABLET | Freq: Two times a day (BID) | ORAL | 0 refills | Status: DC

## 2017-04-22 MED ORDER — OXYCODONE HCL 5 MG PO TABS: 5.0000 mg | ORAL_TABLET | ORAL | 0 refills | Status: AC | PRN

## 2017-04-22 NOTE — Telephone Encounter (Signed)
Oncology Nurse Navigator Documentation  Spoke with pt, reminded him of importance of tomorrow's 3:00 appt with Dr. Lebron Conners in context of 2/19 No Show.  He voiced understanding, stated he would arrive.  [name], RN, BSN Head & Neck Oncology Nurse [place] [facility] [place] [phone]

## 2017-04-22 NOTE — Progress Notes (Signed)
[place] [facility] Follow-up Visit:  Assessment: Squamous cell cancer of retromolar trigone ([place]) 57 y.o.  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  Patient was supposed to be on systemic chemotherapy, but did not follow-up with any of the scheduled appointments in the beginning of the treatment necessitating discontinuation of the systemic therapy plan.  Later in the course of radiation, patient returned requesting initiation of systemic therapy, but my opinion, the benefit was minimal at that point and I did not proceed with any systemic treatment considering patient had less than a week left of radiation at that time.  Restaging imaging of neck and chest is suggestive of possible disease recurrence. Patient presents to the clinic today with complaints of oral and throat pain.  Examination confirms progressive swelling and likely progressive malignancy in the neck based on evaluation and palpation.  Presently, he is potential follow-up at [facility] and scheduled on 05/12/17 which is likely too far removed due to visibly progressive malignancy.  My preference at this time is to proceed with palliative systemic chemotherapy combining carboplatin and paclitaxel given in absence of direct tissue confirmation of recurrent malignancy as the clinical picture is pathognomonic.  I would like reevaluation of patient's tracheostomy as examination reveals inspiratory and expiratory wheezing and visible increased work of breathing despite excellent oxygenation.  Plan: -Continue OxyContin/oxycodone combination for pain control. -Consult ENT for tracheostomy assessment and adjustment as needed -Return to clinic next week with labs, clinic visit, and initiation of carboplatin/paclitaxel Systemic palliative chemotherapy.  Voice recognition software was used and creation of this note. Despite my best effort at  editing the text, some misspelling/errors may have occurred.  Orders Placed This Encounter  Procedures  . CBC with Differential (Cancer Center Only)    Standing Status:   Future    Standing Expiration Date:   04/23/2018  . CMP ([place] only)    Standing Status:   Future    Standing Expiration Date:   04/23/2018  . Magnesium    Standing Status:   Future    Standing Expiration Date:   04/22/2018    Cancer Staging Oral cancer [facility]) Staging form: Oral Cavity, AJCC 8th Edition - Clinical: Stage III (cT3, cN1, cM0) - Signed by Eppie Gibson, MD on 08/25/2016  Squamous cell cancer of retromolar trigone ([place]) Staging form: Oral Cavity, AJCC 8th Edition - Pathologic stage from 10/16/2016: Stage IVB (pT2, pN3b, cM0) - Unsigned   All questions were answered.  . The patient knows to call the clinic with any problems, questions or concerns.  This note was electronically signed.    History of Presenting Illness Lawrence Arias is a 57 y.o. followed in the 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 Since the diagnosis in Jul 2018, patient underwent surgery on 10/16/16 including graft tissue placement. Recovered well from the operation, and he is referred to medical oncology for possible systemic chemotherapy as an adjunctive for the adjuvant radiation therapy. His primary ENT is Dr. Janace Hoard and Radiation Oncologist is Dr Isidore Moos.  Due to concerns for residual/recurrent disease in the surgical bed, patient was recommended to undergo chemoradiotherapy and was planned for the treatment in mid October 2018.  Nevertheless, he did not show up for several appointments with our clinic and eventually we made decision to forego chemotherapy.  Patient is nearing completion of his radiation therapy and tolerated it quite well so far.  Patient returns to the clinic to discuss management of the recently diagnosed recurrent malignancy.  In the interim, patient  continues to have progressive swelling in the face, mouth, and neck.  He complains of difficulty breathing through his current tracheostomy believing that the one that has been recently placed is too small for him.  He complains of significant amount of pain especially in the left shoulder, but also in the neck and face.  He has been out of medication for about a week now for pain management.  It has been a recurrent problem with helping this gentleman with him not showing up to the appointments, he missed our last visit in the clinic as well as he missed schedule visit with [facility].  The scheduled visit is planned to occur on 05/12/17.   Oncological/hematological History:   Squamous cell cancer of retromolar trigone ([place])   07/24/2016 Initial Diagnosis    Squamous cell cancer of retromolar trigone [facility]): Initial biopsy demonstrating moderately differentiated invasive squamous cell carcinoma.       08/14/2016 Imaging    CT neck: Nonvisualization of left retromolar trigone mucosal lesion. No evidence of invasion into the adjacent structures. 2. Left level IIa lymph node measuring 1 cm, likely indicating spread of disease. No contralateral adenopathy.      09/29/2016 Imaging    CT Neck [facility]): Left-sided pathological lymphadenopathy of metastatic disease at level IB, IIA. Possible involvement of level IA, level III, and level IV on the left side.      10/16/2016 Surgery    Dr Harriett Rush Sunny Schlein [place]  [facility]): LEFT TONSIL, SOFT PALATE, FLOOR OF MOUTH, BASE OF TONGUE, AND MANDIBLE, PARTIAL GLOSSOPHARYNGECTOMY AND PARTIAL MANDIBULECTOMY, LEFT PARAPHARYNGEAL MASS, EXCISION, TEETH, REMOVAL       10/16/2016 Pathology Results    Surgical Pathology:  LEFT TONSIL, SOFT PALATE, FLOOR OF MOUTH, BASE OF TONGUE, AND MANDIBLE, PARTIAL GLOSSOPHARYNGECTOMY AND PARTIAL MANDIBULECTOMY: Invasive squamous cell carcinoma, keratinizing type, moderately differentiated, is identified forming a  3.7x1.8x1.0cm mass in the left retromolar area.Tumor invades to a depth of 0.8 cm.Lymphovascular invasion is identified. Perineural invasion is identified. The medial soft tissue margin is positive for invasive squamous cell carcinoma (focal). LEFT PARAPHARYNGEAL MASS, EXCISION: Invasive squamous cell carcinoma forming a 1.2x1.2x0.5cm mass. Invasive carcinoma involves the unoriented soft tissue margin over a linear length of 0.3 cm. TEETH, REMOVAL: Two teeth (gross examination only).  Margins: "TUMOR NEXT TO BONE", EXCISION: Scant fragments of bone and dense fibrous tissue, negative for carcinoma. "PHARYNGEAL WALL MARGIN", EXCISION: Squamous mucosa with acute inflammation and skeletal muscle, negative for carcinoma. "HARD PALATE MARGIN", EXCISION: Squamous mucosa, negative for carcinoma. "TONGUE BASE MARGIN", EXCISION: Squamous mucosa with chronic inflammation, negative for carcinoma. "INFERIOR BUCCAL MUCOSA MARGIN", EXCISION: Squamous mucosa and skeletal muscle, negative for carcinoma. "SUPERIOR BUCCAL MUCOSAL MARGIN", EXCISION: Squamous mucosa with chronic inflammation, negative for carcinoma.  Lymph nodes:  LEVEL 1A NECK CONTENTS, LYMPH NODE DISSECTION: Three lymph nodes, negative for metastatic carcinoma (0/3). LEFT LEVEL 1B NECK CONTENTS, LYMPH NODE DISSECTION: Metastatic carcinoma in two of five lymph nodes (2/5). Largest metastatic deposit measures 2.4 cm. Definitive extranodal extension is not identified. Salivary gland parenchyma, negative for tumor. LEFT LEVEL 2B NECK CONTENTS, LYMPH NODE DISSECTION: Eight lymph nodes, negative for metastatic carcinoma (0/8). LEFT LEVEL 2A, 3, 4, AND 5 NECK CONTENTS, LYMPH NODE DISSECTION: Metastatic carcinoma in one of twenty-two lymph nodes (1/22). Largest metastatic deposit measures 3.5 cm. Extranodal extension is present and measures 1.5 mm beyond the lymph node  capsule.  COMMENT: Immunohistochemical staining for p16 (Block A4) shows the invasive  carcinoma is diffusely positive for p16.The positive control stains appropriately.      11/04/2016 Imaging    CT Neck [facility]): No pathological lymphadenopathy. Surgical changes noted. Significant fluid collection in the left upper neck with internal gas and significant soft tissue swelling.      11/30/2016 Imaging    CT Neck: Postop resection of retromolar trigone carcinoma on the left with free flap. Postsurgical changes left neck without evidence of recurrent tumor.   CT Chest: No suspicious pulmonary nodules or masses identified. No thoracic adenopathy.      12/31/2016 - 02/23/2017 Radiation Therapy    Left oral cavity and regional nodes: --66Gy/33 fx        03/25/2017 Relapse/Recurrence    CT Neck & Chest: Likely recurrent disease in the neck with left submandibular mass measuring 3.0 cm.  Multiple new small pulmonary nodules worrisome for possible metastatic disease.       Medical History: Past Medical History:  Diagnosis Date  . Cancer ([place])   . GERD (gastroesophageal reflux disease)    uses OTC prn  . History of radiation therapy 12/31/16- 02/23/2017   Left oral cavity and regional nodes, 66 Gy in 33 fractions.   . Hypertension   . Tongue lesion     Surgical History: Past Surgical History:  Procedure Laterality Date  . DIRECT LARYNGOSCOPY N/A 07/24/2016   Procedure: DIRECT LARYNGOSCOPY;  Surgeon: Melissa Montane, MD;  Location: [place];  Service: ENT;  Laterality: N/A;  Direct laryngoscopy with biopsy tongue lesion  . FRACTURE SURGERY     left wrist, remote  . FREE FLAP RADIAL FOREARM Left 10/16/2016   Dr. Hendricks Limes [facility]  . free flap scapular Left 10/16/2016   Dr. Hendricks Limes - [facility] hospital  . GASTROSTOMY  10/27/2016   Dr. Daphene Calamity- [facility] hospital.   . IR FLUORO GUIDE PORT INSERTION RIGHT  12/22/2016  . IR US GUIDE VASC ACCESS RIGHT  12/22/2016  . NECK DISSECTION Bilateral 10/16/2016   Selective neck  dissection, Dr. Hendricks Limes [facility]  . 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 Hendricks Limes [facility]  . SKIN GRAFT Left 10/16/2016   Split thickness skin graft, leg Dr. Hendricks Limes. [facility]  . TONGUE BIOPSY  07/24/2016   Procedure: TONGUE BIOPSY;  Surgeon: Melissa Montane, MD;  Location: [place];  Service: ENT;;  . TRACHEOSTOMY  10/16/2016   Dr. Hendricks Limes [facility]    Family History: Family History  Problem Relation Age of Onset  . Hypertension Mother   . Hypertension Father     Social History: Social History   Socioeconomic History  . Marital status: Single    Spouse name: Not on file  . Number of children: 3  . Years of education: Not on file  . Highest education level: Not on file  Social Needs  . Financial resource strain: Not on file  . Food insecurity - worry: Not on file  . Food insecurity - inability: Not on file  . Transportation needs - medical: Not on file  . Transportation needs - non-medical: Not on file  Occupational History  . Not on file  Tobacco Use  . Smoking status: Current Some Day Smoker    Packs/day: 0.50    Types: Cigarettes  . Smokeless tobacco: Never Used  . Tobacco comment: he is smoking 0ne every other day (11/20/16)  Substance  and Sexual Activity  . Alcohol use: No  . Drug use: No  . Sexual activity: Not on file  Other Topics Concern  . Not on file  Social History Narrative  . Not on file    Allergies: No Known Allergies  Medications:  Current Outpatient Medications  Medication Sig Dispense Refill  . acetaminophen (TYLENOL) 500 MG tablet Take 1,000 mg by mouth every 6 (six) hours as needed for moderate pain.    Marland Kitchen amLODipine (NORVASC) 5 MG tablet Take 5 mg by mouth daily.  0  . buPROPion (WELLBUTRIN SR) 150 MG 12 hr tablet Start one week before quit date. Take 1 tab daily x 3 days, then 1 tab BID thereafter. 60 tablet 2  . gabapentin (NEURONTIN) 300 MG capsule Take 1 capsule (300 mg total) by mouth 3 (three) times daily. To  address cancer related pain. 90 capsule 0  . lidocaine (XYLOCAINE) 2 % solution Use as directed 20 mLs in the mouth or throat as needed for mouth pain. 100 mL 0  . nicotine (NICODERM CQ - DOSED IN MG/24 HOURS) 14 mg/24hr patch Place 1 patch (14 mg total) onto the skin daily. Apply 21 mg patch daily x 6 wk, then '14mg'$  patch daily x 2 wk, then 7 mg patch daily x 2 wk 14 patch 0  . nicotine (NICODERM CQ - DOSED IN MG/24 HOURS) 21 mg/24hr patch Place 1 patch (21 mg total) onto the skin daily. Apply 21 mg patch daily x 6 wk, then '14mg'$  patch daily x 2 wk, then 7 mg patch daily x 2 wk 14 patch 2  . nicotine (NICODERM CQ - DOSED IN MG/24 HR) 7 mg/24hr patch Place 1 patch (7 mg total) onto the skin daily. Apply 21 mg patch daily x 6 wk, then '14mg'$  patch daily x 2 wk, then 7 mg patch daily x 2 wk 14 patch 0  . oxyCODONE (OXY IR/ROXICODONE) 5 MG immediate release tablet Take 1 tablet (5 mg total) by mouth every 4 (four) hours as needed for up to 14 days for severe pain. 50 tablet 0  . oxyCODONE (OXYCONTIN) 10 mg 12 hr tablet Take 1 tablet (10 mg total) by mouth every 12 (twelve) hours. 60 tablet 0  . polyethylene glycol powder (MIRALAX) powder Use as directed for constipation. Follow directions on container. (Patient not taking: Reported on 03/19/2017) 850 g 3   No current facility-administered medications for this visit.     Review of Systems: Review of Systems  Constitutional: Positive for fatigue.  HENT:   Positive for lump/mass, sore throat and trouble swallowing.   All other systems reviewed and are negative.    PHYSICAL EXAMINATION Blood pressure (!) 153/117, pulse (!) 105, temperature 97.6 F (36.4 C), temperature source Oral, resp. rate 17, height '5\' 7"'$  (1.702 m), weight 115 lb (52.2 kg), SpO2 100 %.  ECOG PERFORMANCE STATUS: 2 - Symptomatic, <50% confined to bed  Physical Exam  Constitutional: He is oriented to person, place, and time and well-developed, well-nourished, and in no distress. No  distress.  HENT:  Interval resolution of oral thrush.  Interval significant progression of left facial and cervical swelling with progressive palpable masses in the left neck including several dermal lesions including one with ulceration.  Eyes: EOM are normal. Pupils are equal, round, and reactive to light. No scleral icterus.  Neck: Normal range of motion. Neck supple. No thyromegaly present.  Cardiovascular: Normal rate, regular rhythm and intact distal pulses.  No murmur heard. Pulmonary/Chest:  Effort normal and breath sounds normal. No respiratory distress. He has no wheezes. He has no rales.  Abdominal: Soft. Bowel sounds are normal. He exhibits no distension and no mass. There is no tenderness. There is no rebound and no guarding.  Musculoskeletal: Normal range of motion. He exhibits no edema.  Lymphadenopathy:    He has no cervical adenopathy.  Neurological: He is alert and oriented to person, place, and time. He has normal reflexes. No cranial nerve deficit. Coordination normal.  Skin: Skin is dry. No rash noted. He is not diaphoretic. No erythema. No pallor.     LABORATORY DATA: I have personally reviewed the data as listed: No visits with results within 1 Week(s) from this visit.  Latest known visit with results is:  Hospital Outpatient Visit on 03/25/2017  Component Date Value Ref Range Status  . BUN 03/25/2017 4* 7 - 26 mg/dL Final  . Creatinine 03/25/2017 0.82  0.70 - 1.30 mg/dL Final  . GFR, Est Non Af Am 03/25/2017 >60  >60 mL/min Final  . GFR, Est AFR Am 03/25/2017 >60  >60 mL/min Final   Comment: (NOTE) The eGFR has been calculated using the CKD EPI equation. This calculation has not been validated in all clinical situations. eGFR's persistently <60 mL/min signify possible Chronic Kidney Disease. Performed at [facility], [place] [address] [place], [place] [address]        Ardath Sax, MD

## 2017-04-22 NOTE — Assessment & Plan Note (Signed)
57 y.o.  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  Patient was supposed to be on systemic chemotherapy, but did not follow-up with any of the scheduled appointments in the beginning of the treatment necessitating discontinuation of the systemic therapy plan.  Later in the course of radiation, patient returned requesting initiation of systemic therapy, but my opinion, the benefit was minimal at that point and I did not proceed with any systemic treatment considering patient had less than a week left of radiation at that time.  Restaging imaging of neck and chest is suggestive of possible disease recurrence. Patient presents to the clinic today with complaints of oral and throat pain.  Examination confirms progressive swelling and likely progressive malignancy in the neck based on evaluation and palpation.  Presently, he is potential follow-up at [facility] and scheduled on 05/12/17 which is likely too far removed due to visibly progressive malignancy.  My preference at this time is to proceed with palliative systemic chemotherapy combining carboplatin and paclitaxel given in absence of direct tissue confirmation of recurrent malignancy as the clinical picture is pathognomonic.  I would like reevaluation of patient's tracheostomy as examination reveals inspiratory and expiratory wheezing and visible increased work of breathing despite excellent oxygenation.  Plan: -Continue OxyContin/oxycodone combination for pain control. -Consult ENT for tracheostomy assessment and adjustment as needed -Return to clinic next week with labs, clinic visit, and initiation of carboplatin/paclitaxel Systemic palliative chemotherapy.

## 2017-04-22 NOTE — Telephone Encounter (Addendum)
Oncology Nurse Navigator Documentation  S/p follow-up appt with Dr. Lebron Conners and per his discussion with patient, called [facility], spoke with MA Olivia Mackie, arranged urgent appt with Dr. Constance Holster for evaluation of neck tomorrow 2:10.  (Pt's ENT Dr. Janace Hoard not available.)  Informed patient verbally and in writing (in [facility] lobby), informed Dr. Lebron Conners.  [name], RN, BSN Head & Neck Oncology Nurse [place] at [place] [phone]

## 2017-04-22 NOTE — Telephone Encounter (Signed)
Gave patient avs and calendar with appts per 3/7 los.  °

## 2017-04-23 NOTE — Progress Notes (Signed)
Oncology Nurse Navigator Documentation  To provide support, encouragement and care continuity, met with [name] during est pt follow-up with Dr. Lebron Conners.  He was accompanied by his girlfriend. He presented with increased L neck swelling, reported FOM swelling. He voiced understanding of cancer recurrence, agreed to move forward with systemic tmt. He was encouraged to keep 3/27 follow-up with Dr. Gavin Pound, [facility]. He agreed to attend next Tuesday morning's [facility] [place] for f/u with SLP, PT and Nutrition. I encouraged him to call me with questions/concerns.  [name], RN, BSN Head & Neck Oncology Nurse [place] at [place] [phone]

## 2017-04-26 ENCOUNTER — Encounter: Payer: Self-pay | Admitting: Nutrition

## 2017-04-26 NOTE — Progress Notes (Signed)
Provided 3rd case Ensure Plus.

## 2017-04-27 ENCOUNTER — Ambulatory Visit: Payer: Medicaid Other

## 2017-04-27 ENCOUNTER — Ambulatory Visit: Payer: Medicaid Other | Attending: Radiation Oncology

## 2017-04-27 ENCOUNTER — Encounter: Payer: Self-pay | Admitting: Nutrition

## 2017-04-27 ENCOUNTER — Ambulatory Visit: Payer: Medicaid Other | Admitting: Physical Therapy

## 2017-04-28 ENCOUNTER — Encounter: Payer: Self-pay | Admitting: *Deleted

## 2017-04-28 ENCOUNTER — Inpatient Hospital Stay: Payer: Medicaid Other

## 2017-04-28 ENCOUNTER — Other Ambulatory Visit: Payer: Self-pay | Admitting: Hematology and Oncology

## 2017-04-28 DIAGNOSIS — Z95828 Presence of other vascular implants and grafts: Secondary | ICD-10-CM

## 2017-04-28 MED ORDER — HEPARIN SOD (PORK) LOCK FLUSH 100 UNIT/ML IV SOLN
250.0000 [IU] | INTRAVENOUS | Status: AC | PRN
  Filled 2017-04-28: qty 5

## 2017-04-28 MED ORDER — ALTEPLASE 2 MG IJ SOLR
2.0000 mg | Freq: Once | INTRAMUSCULAR | Status: AC | PRN
  Filled 2017-04-28: qty 2

## 2017-04-28 MED ORDER — HEPARIN SOD (PORK) LOCK FLUSH 100 UNIT/ML IV SOLN
500.0000 [IU] | INTRAVENOUS | Status: AC | PRN
  Filled 2017-04-28: qty 5

## 2017-04-28 MED ORDER — SODIUM CHLORIDE 0.9% FLUSH
10.0000 mL | INTRAVENOUS | Status: AC | PRN
  Filled 2017-04-28: qty 10

## 2017-04-28 NOTE — Progress Notes (Signed)
Pre-op instructions given to pt only; please complete pt assessment on DOS. Pt made aware to stop taking Aspirin (doesn't take), vitamins, fish oil and herbal medications. Do not take any NSAIDs ie: Ibuprofen, Advil, Naproxen (Aleve), Motrin, BC and Goody Powder. Pt verbalized understanding of all pre-op instructions.

## 2017-04-28 NOTE — H&P (Signed)
Otolaryngology Clinic Note  HPI:    Lawrence Arias is a 57 y.o. male patient of Deboraha Sprang, ANP for evaluation of trach tube dislodgment.  He saw Dr. Constance Holster the 5 days ago with a completely occluded inner cannula.  The outer cannula was in good position.  After cleaning, his airway was good again.  3 or 4 days ago, the tube came out when he was attempting to clean it.  He does not attempt to replace it or contact anybody.  He is now here for further consideration.  If he is sedentary, his breathing is satisfactory.  With any activity, however, he becomes dyspneic.   As best I can determine from the chart and from his story, he is receiving palliative chemotherapy.  PMH/Meds/All/SocHx/FamHx/ROS:   Past Medical History      Past Medical History:  Diagnosis Date  . Arthritis   . Cancer ([place])   . Hypertension   . Speech disorder       Past Surgical History       Past Surgical History:  Procedure Laterality Date  . arm surgery    . FREE FLAP RADIAL FOREARM Left 10/16/2016   Procedure: FREE FLAP RADIAL FOREARM;  Surgeon: Wyline Beady, MD;  Location: [place] MAIN OR;  Service: ENT;  Laterality: Left;  . FREE FLAP SCAPULAR Left 10/16/2016   Procedure: FREE FLAP SCAPULAR;  Surgeon: Wyline Beady, MD;  Location: [place] MAIN OR;  Service: ENT;  Laterality: Left;  Marland Kitchen GASTROSTOMY TUBE PLACEMENT N/A 10/27/2016   Procedure: GASTROSTOMY PERCUTANEOUS ENDOSCOPIC (PEG), Trach change;  Surgeon: Rennis Golden, MD;  Location: [place];  Service: General;  Laterality: N/A;  . NECK DISSECTION Bilateral 10/16/2016   Procedure: SELECTIVE NECK DISSECTION;  Surgeon: Wyline Beady, MD;  Location: [place] MAIN OR;  Service: ENT;  Laterality: Bilateral;  . REMOVAL ORAL CANCER Left 10/16/2016   Procedure: RESECTION OF ORAL CANCER;  Surgeon: Wyline Beady, MD;  Location: [facility] MA[facility] OR;  Service: ENT;  Laterality: Left;  . SKIN GRAFT Left 10/16/2016   Procedure: SPLIT  THICKNESS SKIN GRAFT LEG;  Surgeon: Wyline Beady, MD;  Location: [place] MAIN OR;  Service: ENT;  Laterality: Left;  . TRACHEOSTOMY N/A 10/16/2016   Procedure: TRACHEOTOMY;  Surgeon: Wyline Beady, MD;  Location: [facility];  Service: ENT;  Laterality: N/A;      No family history of bleeding disorders, wound healing problems or difficulty with anesthesia.   Social History  Social History        Social History  . Marital status: Single    Spouse name: N/A  . Number of children: N/A  . Years of education: N/A      Occupational History  . Not on file.         Social History Main Topics  . Smoking status: Current Every Day Smoker    Packs/day: 1.00    Years: 40.00    Types: Cigarettes  . Smokeless tobacco: Never Used     Comment: currrently smoking 3-4 cigarettes per day  . Alcohol use 12.0 oz/week    20 Cans of beer per week  . Drug use: No  . Sexual activity: Not on file       Other Topics Concern  . Not on file      Social History Narrative  . No narrative on file       Current Outpatient Prescriptions:  .  amLODIPine (NORVASC) 5 MG tablet, 1  tablet (5 mg total) by Per G Tube route daily. (Patient taking differently: Take 5 mg by mouth daily. ), Disp: , Rfl:  .  gabapentin (NEURONTIN) 300 MG capsule, Take 300 mg by mouth 3 times daily., Disp: , Rfl:  .  HYDROcodone-acetaminophen 7.5-325 mg/15 mL solution, Take 15 mLs by mouth nightly as needed., Disp: , Rfl: 0 .  ibuprofen (MOTRIN) 100 mg/5 mL suspension, 20 mLs (400 mg total) by Per G Tube route every 6 (six) hours as needed for Mild pain, Moderate pain or Fever., Disp: , Rfl:  .  lisinopril (PRINIVIL,ZESTRIL) 20 MG tablet, Take 20 mg by mouth daily., Disp: , Rfl: 0 .  oxyCODONE (ROXICODONE) 5 MG immediate release tablet, take 1 tablet by mouth every 4 hours if needed for severe pain, Disp: , Rfl: 0 .  UNABLE TO FIND, Flush g-tube 150 mL of free water every 4 hours, Disp: , Rfl:    A complete ROS was performed with pertinent positives/negatives noted in the HPI. The remainder of the ROS are negative.    Physical Exam:    There were no vitals taken for this visit. He is not dyspneic.  His voice is raspy.  He has surgical changes of the left and neck and mouth.  The tracheostomy site has healed completely shut.   Lungs: Clear to auscultation Heart: Regular rate and rhythm without murmurs Abdomen: Soft, active Extremities: Normal configuration Neurologic: Symmetric, grossly intact.      Impression & Plans:   Trach tube dislodgment with healed tract.  Plan: I think we are safe to wait until tomorrow morning at which point we will put him to sleep and revise the tract.   Lilyan Gilford, MD  [id number]/[id number]

## 2017-04-28 NOTE — Progress Notes (Signed)
Oncology Nurse Navigator Documentation  Called to Chemo Ed conference room by RN Abigail Butts for assist.  Upon arrival, noted Mr. Delo has removed his trach, he reported occurred Monday evening when he was cleaning.  He apparently thought he was to remove the entire trach instead of just the inner cannula.  He reported some distressed breathing; O2 sats measured 98%.  Spoke with ENT Dr. Constance Holster.  Per his guidance, called his office, arranged for Mr. Kinnaird to see On-call Dr. Erik Obey 6:68 today.  Mr. Villalpando had left [place] to pick of analgesic Rx, was instructed to arrive to [facility] or [facility] ED if breathing become compromised.  Spoke with girlfriend Bethena Roys, informed her of appt.  She voiced understanding.    [name], RN, BSN Head & Neck Oncology Nurse [place] at [place] [phone]

## 2017-04-28 NOTE — Progress Notes (Signed)
Oncology Nurse Navigator Documentation  Confirmed via Care Everywhere Mr. Warzecha arrived for [place] appt with Dr. Erik Obey.  [name], RN, BSN Head & Neck Oncology Nurse [place] at [place] [phone]

## 2017-04-29 ENCOUNTER — Encounter (HOSPITAL_COMMUNITY): Payer: Self-pay | Admitting: Certified Registered Nurse Anesthetist

## 2017-04-29 ENCOUNTER — Inpatient Hospital Stay (HOSPITAL_COMMUNITY): Payer: Medicaid Other | Admitting: Anesthesiology

## 2017-04-29 ENCOUNTER — Other Ambulatory Visit: Payer: Self-pay

## 2017-04-29 ENCOUNTER — Encounter (HOSPITAL_COMMUNITY)
Admission: RE | Disposition: A | Discharge status: Home / Self Care | Payer: Self-pay | Source: Ambulatory Visit | Attending: Otolaryngology

## 2017-04-29 ENCOUNTER — Ambulatory Visit (HOSPITAL_COMMUNITY)
Admission: RE | Admit: 2017-04-29 | Discharge: 2017-04-29 | Disposition: A | Discharge status: Home / Self Care | Payer: Medicaid Other | Source: Ambulatory Visit | Attending: Otolaryngology | Admitting: Otolaryngology

## 2017-04-29 DIAGNOSIS — I10 Essential (primary) hypertension: Secondary | ICD-10-CM | POA: Diagnosis not present

## 2017-04-29 DIAGNOSIS — Z79899 Other long term (current) drug therapy: Secondary | ICD-10-CM | POA: Diagnosis not present

## 2017-04-29 DIAGNOSIS — F17210 Nicotine dependence, cigarettes, uncomplicated: Secondary | ICD-10-CM | POA: Diagnosis not present

## 2017-04-29 DIAGNOSIS — J9503 Malfunction of tracheostomy stoma: Secondary | ICD-10-CM | POA: Insufficient documentation

## 2017-04-29 HISTORY — PX: TRACHEOSTOMY TUBE PLACEMENT: SHX814

## 2017-04-29 LAB — CBC
HCT: 31.5 % — ABNORMAL LOW (ref 39.0–52.0)
Hemoglobin: 11.2 g/dL — ABNORMAL LOW (ref 13.0–17.0)
MCH: 34.3 pg — AB (ref 26.0–34.0)
MCHC: 35.6 g/dL (ref 30.0–36.0)
MCV: 96.3 fL (ref 78.0–100.0)
PLATELETS: 292 10*3/uL (ref 150–400)
RBC: 3.27 MIL/uL — AB (ref 4.22–5.81)
RDW: 15.2 % (ref 11.5–15.5)
WBC: 5.3 10*3/uL (ref 4.0–10.5)

## 2017-04-29 LAB — BASIC METABOLIC PANEL
Anion gap: 12 (ref 5–15)
CO2: 24 mmol/L (ref 22–32)
Calcium: 9.6 mg/dL (ref 8.9–10.3)
Chloride: 85 mmol/L — ABNORMAL LOW (ref 101–111)
Creatinine, Ser: 0.58 mg/dL — ABNORMAL LOW (ref 0.61–1.24)
GFR calc Af Amer: >60 mL/min (ref >60)
Glucose, Bld: 105 mg/dL — ABNORMAL HIGH (ref 65–99)
POTASSIUM: 3.5 mmol/L (ref 3.5–5.1)
Sodium: 121 mmol/L — ABNORMAL LOW (ref 135–145)

## 2017-04-29 SURGERY — CREATION, TRACHEOSTOMY
Anesthesia: General | Site: Neck

## 2017-04-29 MED ORDER — FENTANYL CITRATE (PF) 250 MCG/5ML IJ SOLN
INTRAMUSCULAR | Status: DC | PRN
  Administered 2017-04-29 (×2): 25 ug via INTRAVENOUS

## 2017-04-29 MED ORDER — SUCCINYLCHOLINE CHLORIDE 20 MG/ML IJ SOLN
INTRAMUSCULAR | Status: AC
  Filled 2017-04-29: qty 1

## 2017-04-29 MED ORDER — LIDOCAINE-EPINEPHRINE 1 %-1:100000 IJ SOLN
INTRAMUSCULAR | Status: AC
  Filled 2017-04-29: qty 1

## 2017-04-29 MED ORDER — FENTANYL CITRATE (PF) 250 MCG/5ML IJ SOLN
INTRAMUSCULAR | Status: AC
  Filled 2017-04-29: qty 5

## 2017-04-29 MED ORDER — CHLORHEXIDINE GLUCONATE CLOTH 2 % EX PADS: 6.0000 | MEDICATED_PAD | Freq: Once | CUTANEOUS | Status: DC

## 2017-04-29 MED ORDER — LIDOCAINE 2% (20 MG/ML) 5 ML SYRINGE
INTRAMUSCULAR | Status: DC | PRN
  Administered 2017-04-29: 100 mg via INTRAVENOUS

## 2017-04-29 MED ORDER — PROPOFOL 10 MG/ML IV BOLUS
INTRAVENOUS | Status: AC
  Filled 2017-04-29: qty 20

## 2017-04-29 MED ORDER — LABETALOL HCL 5 MG/ML IV SOLN
INTRAVENOUS | Status: AC
  Filled 2017-04-29: qty 4

## 2017-04-29 MED ORDER — LACTATED RINGERS IV SOLN
INTRAVENOUS | Status: DC | PRN
  Administered 2017-04-29: 08:00:00 via INTRAVENOUS

## 2017-04-29 MED ORDER — 0.9 % SODIUM CHLORIDE (POUR BTL) OPTIME
TOPICAL | Status: DC | PRN
  Administered 2017-04-29: 1000 mL

## 2017-04-29 MED ORDER — PHENYLEPHRINE 40 MCG/ML (10ML) SYRINGE FOR IV PUSH (FOR BLOOD PRESSURE SUPPORT)
PREFILLED_SYRINGE | INTRAVENOUS | Status: DC | PRN
  Administered 2017-04-29: 120 ug via INTRAVENOUS
  Administered 2017-04-29: 80 ug via INTRAVENOUS

## 2017-04-29 MED ORDER — ROCURONIUM BROMIDE 10 MG/ML (PF) SYRINGE
PREFILLED_SYRINGE | INTRAVENOUS | Status: AC
  Filled 2017-04-29: qty 5

## 2017-04-29 MED ORDER — LIDOCAINE-EPINEPHRINE 1 %-1:100000 IJ SOLN
INTRAMUSCULAR | Status: DC | PRN
  Administered 2017-04-29: 20 mL via INTRADERMAL

## 2017-04-29 MED ORDER — LABETALOL HCL 5 MG/ML IV SOLN
5.0000 mg | Freq: Once | INTRAVENOUS | Status: AC
  Administered 2017-04-29: 5 mg via INTRAVENOUS
  Filled 2017-04-29: qty 4

## 2017-04-29 MED ORDER — PHENYLEPHRINE HCL 10 MG/ML IJ SOLN
INTRAVENOUS | Status: DC | PRN
  Administered 2017-04-29: 20 ug/min via INTRAVENOUS

## 2017-04-29 MED ORDER — LIDOCAINE HCL (CARDIAC) 20 MG/ML IV SOLN
INTRAVENOUS | Status: AC
  Filled 2017-04-29: qty 5

## 2017-04-29 MED ORDER — CEFAZOLIN SODIUM-DEXTROSE 2-4 GM/100ML-% IV SOLN
2.0000 g | INTRAVENOUS | Status: AC
  Administered 2017-04-29: 2 g via INTRAVENOUS
  Filled 2017-04-29: qty 100

## 2017-04-29 MED ORDER — MIDAZOLAM HCL 2 MG/2ML IJ SOLN
INTRAMUSCULAR | Status: AC
  Filled 2017-04-29: qty 2

## 2017-04-29 MED ORDER — ONDANSETRON HCL 4 MG/2ML IJ SOLN
INTRAMUSCULAR | Status: DC | PRN
  Administered 2017-04-29: 4 mg via INTRAVENOUS

## 2017-04-29 MED ORDER — PROPOFOL 10 MG/ML IV BOLUS
INTRAVENOUS | Status: DC | PRN
  Administered 2017-04-29: 150 mg via INTRAVENOUS

## 2017-04-29 MED ORDER — AMLODIPINE BESYLATE 5 MG PO TABS
5.0000 mg | ORAL_TABLET | Freq: Every day | ORAL | Status: DC
  Administered 2017-04-29: 5 mg via ORAL
  Filled 2017-04-29: qty 1

## 2017-04-29 MED ORDER — DEXAMETHASONE SODIUM PHOSPHATE 10 MG/ML IJ SOLN
INTRAMUSCULAR | Status: AC
  Filled 2017-04-29: qty 1

## 2017-04-29 MED ORDER — STERILE WATER FOR IRRIGATION IR SOLN
Status: DC | PRN
  Administered 2017-04-29: 500 mL

## 2017-04-29 MED ORDER — MIDAZOLAM HCL 5 MG/5ML IJ SOLN
INTRAMUSCULAR | Status: DC | PRN
  Administered 2017-04-29 (×2): 1 mg via INTRAVENOUS

## 2017-04-29 MED ORDER — ONDANSETRON HCL 4 MG/2ML IJ SOLN
INTRAMUSCULAR | Status: AC
  Filled 2017-04-29: qty 2

## 2017-04-29 SURGICAL SUPPLY — 41 items
BLADE CLIPPER SURG (BLADE) IMPLANT
BLADE SURG 15 STRL LF DISP TIS (BLADE) IMPLANT
BLADE SURG 15 STRL SS (BLADE)
CANISTER SUCT 3000ML PPV (MISCELLANEOUS) ×2 IMPLANT
CLEANER TIP ELECTROSURG 2X2 (MISCELLANEOUS) ×2 IMPLANT
COVER SURGICAL LIGHT HANDLE (MISCELLANEOUS) ×2 IMPLANT
CRADLE DONUT ADULT HEAD (MISCELLANEOUS) ×1 IMPLANT
DECANTER SPIKE VIAL GLASS SM (MISCELLANEOUS) ×1 IMPLANT
DRAPE HALF SHEET 40X57 (DRAPES) ×1 IMPLANT
ELECT COATED BLADE 2.86 ST (ELECTRODE) ×2 IMPLANT
ELECT REM PT RETURN 9FT ADLT (ELECTROSURGICAL) ×2
ELECTRODE REM PT RTRN 9FT ADLT (ELECTROSURGICAL) ×1 IMPLANT
GLOVE BIOGEL PI IND STRL 6.5 (GLOVE) IMPLANT
GLOVE BIOGEL PI IND STRL 7.5 (GLOVE) IMPLANT
GLOVE BIOGEL PI IND STRL 8 (GLOVE) IMPLANT
GLOVE BIOGEL PI INDICATOR 6.5 (GLOVE) ×1
GLOVE BIOGEL PI INDICATOR 7.5 (GLOVE) ×1
GLOVE BIOGEL PI INDICATOR 8 (GLOVE) ×1
GLOVE ECLIPSE 8.0 STRL XLNG CF (GLOVE) ×3 IMPLANT
GLOVE SURG SS PI 6.0 STRL IVOR (GLOVE) ×1 IMPLANT
GLOVE SURG SS PI 7.0 STRL IVOR (GLOVE) ×1 IMPLANT
GOWN STRL REUS W/ TWL LRG LVL3 (GOWN DISPOSABLE) ×1 IMPLANT
GOWN STRL REUS W/ TWL XL LVL3 (GOWN DISPOSABLE) ×1 IMPLANT
GOWN STRL REUS W/TWL LRG LVL3 (GOWN DISPOSABLE) ×2
GOWN STRL REUS W/TWL XL LVL3 (GOWN DISPOSABLE) ×6
KIT BASIN OR (CUSTOM PROCEDURE TRAY) ×2 IMPLANT
KIT ROOM TURNOVER OR (KITS) ×2 IMPLANT
NDL HYPO 25GX1X1/2 BEV (NEEDLE) ×1 IMPLANT
NEEDLE HYPO 25GX1X1/2 BEV (NEEDLE) ×2 IMPLANT
NS IRRIG 1000ML POUR BTL (IV SOLUTION) ×2 IMPLANT
PAD ARMBOARD 7.5X6 YLW CONV (MISCELLANEOUS) ×3 IMPLANT
PENCIL BUTTON HOLSTER BLD 10FT (ELECTRODE) ×2 IMPLANT
SOAP 2 % CHG 4 OZ (WOUND CARE) ×1 IMPLANT
SPONGE DRAIN TRACH 4X4 STRL 2S (GAUZE/BANDAGES/DRESSINGS) ×2 IMPLANT
SUT CHROMIC 2 0 SH (SUTURE) ×2 IMPLANT
SUT ETHILON 2 0 FS 18 (SUTURE) IMPLANT
SUT SILK 2 0 SH CR/8 (SUTURE) ×2 IMPLANT
SYR 5ML LL (SYRINGE) ×1 IMPLANT
TRAY ENT MC OR (CUSTOM PROCEDURE TRAY) ×2 IMPLANT
TUBE TRACH SHILE 4 NONFEN UNCU (TUBING) ×1 IMPLANT
WATER STERILE IRR 1000ML POUR (IV SOLUTION) ×2 IMPLANT

## 2017-04-29 NOTE — Interval H&P Note (Signed)
History and Physical Interval Note:  04/29/2017 8:57 AM  Lawrence Arias  has presented today for surgery, with the diagnosis of distress breathing  The various methods of treatment have been discussed with the patient and family. After consideration of risks, benefits and other options for treatment, the patient has consented to  Procedure(s): TRACHEOSTOMY REVISION (N/A) as a surgical intervention .  The patient's history has been re-reviewed, patient re-examined, no change in status, stable for surgery.  I have re-reviewed the patient's chart and labs.  Questions were answered to the patient's satisfaction.     Jodi Marble

## 2017-04-29 NOTE — Anesthesia Postprocedure Evaluation (Signed)
Anesthesia Post Note  Patient: Lawrence Arias  Procedure(s) Performed: TRACHEOSTOMY REVISION (N/A Neck)     Patient location during evaluation: PACU Anesthesia Type: General Level of consciousness: awake and sedated Pain management: pain level controlled Vital Signs Assessment: post-procedure vital signs reviewed and stable Respiratory status: spontaneous breathing, nonlabored ventilation, respiratory function stable and patient connected to nasal cannula oxygen Cardiovascular status: blood pressure returned to baseline and stable Postop Assessment: no apparent nausea or vomiting Anesthetic complications: no    Last Vitals:  Vitals:   04/29/17 1010 04/29/17 1015  BP: (!) 113/97   Pulse: 72 72  Resp: 12 11  Temp: (!) 36.4 C   SpO2: 99% 100%    Last Pain:  Vitals:   04/29/17 0910  TempSrc:   PainSc: Asleep                 Kire Ferg,JAMES TERRILL

## 2017-04-29 NOTE — Progress Notes (Signed)
Notified Dr. Orene Desanctis of elevated BP, patient has not had norvasc since Tuesday. (order received to give norvasc 5 mg po). Norvasc given at 0710.

## 2017-04-29 NOTE — OR Nursing (Signed)
Obturator taped to head of head in OR.

## 2017-04-29 NOTE — Discharge Instructions (Signed)
Resume your blood pressure medications and contact your primary doctor for assistance  May switch to Velcro trach ties whenever. May suction trach Moist air mask over trach at all times OK to shower See instructions from trach care team Recheck my office 1 week.   [phone]

## 2017-04-29 NOTE — Transfer of Care (Signed)
Immediate Anesthesia Transfer of Care Note  Patient: Lawrence Arias  Procedure(s) Performed: TRACHEOSTOMY REVISION (N/A Neck)  Patient Location: PACU  Anesthesia Type:General  Level of Consciousness: awake  Airway & Oxygen Therapy: Patient Spontanous Breathing  Post-op Assessment: Report given to RN and Post -op Vital signs reviewed and stable  Post vital signs: Reviewed and stable  Last Vitals:  Vitals:   04/29/17 0629 04/29/17 0700  BP: (!) 156/125 (!) 136/107  Pulse:  96  Resp:    Temp:    SpO2:  99%    Last Pain:  Vitals:   04/29/17 0643  TempSrc:   PainSc: 9       Patients Stated Pain Goal: 8 (31/12/16 2446)  Complications: No apparent anesthesia complications

## 2017-04-29 NOTE — Anesthesia Procedure Notes (Addendum)
Procedure Name: Intubation Date/Time: 04/29/2017 8:33 AM Performed by: Harden Mo, CRNA Pre-anesthesia Checklist: Patient identified, Emergency Drugs available, Suction available and Patient being monitored Patient Re-evaluated:Patient Re-evaluated prior to induction Oxygen Delivery Method: Circle System Utilized Preoxygenation: Pre-oxygenation with 100% oxygen Induction Type: IV induction Ventilation: Unable to mask ventilate LMA: LMA inserted LMA Size: 3.0 Laryngoscope Size: Glidescope and 4 Grade View: Grade III Tube type: Oral Tube size: 6.0 mm Number of attempts: 1 Airway Equipment and Method: Stylet,  Oral airway and Video-laryngoscopy Placement Confirmation: ETT inserted through vocal cords under direct vision,  positive ETCO2 and breath sounds checked- equal and bilateral Secured at: 20 cm Tube secured with: Tape Dental Injury: Teeth and Oropharynx as per pre-operative assessment  Difficulty Due To: Difficulty was anticipated Future Recommendations: Recommend- induction with short-acting agent, and alternative techniques readily available and Recommend- awake intubation Comments: SIVI. Unable to mask ventilate patient. LMA 3 placed and unable to ventilate patient. LMA 3 removed. Glidescope 4 used to visualize mass in front of glottic opening. Grade 3 view obtained by Dr. Orene Desanctis to place 6.0 ETT.

## 2017-04-29 NOTE — Anesthesia Preprocedure Evaluation (Addendum)
Anesthesia Evaluation  Patient identified by MRN, date of birth, ID band Patient awake    Reviewed: Allergy & Precautions, NPO status , Patient's Chart, lab work & pertinent test results  Airway Mallampati: III  TM Distance: <3 FB Neck ROM: Limited    Dental  (+) Dental Advisory Given, Missing, Poor Dentition,    Pulmonary Current Smoker,    breath sounds clear to auscultation       Cardiovascular hypertension,  Rhythm:Regular Rate:Normal     Neuro/Psych    GI/Hepatic GERD  ,  Endo/Other    Renal/GU      Musculoskeletal   Abdominal   Peds  Hematology   Anesthesia Other Findings   Reproductive/Obstetrics                           Anesthesia Physical Anesthesia Plan  ASA: III  Anesthesia Plan: General   Post-op Pain Management:    Induction: Intravenous  PONV Risk Score and Plan: 3  Airway Management Planned: Oral ETT  Additional Equipment:   Intra-op Plan:   Post-operative Plan:   Informed Consent: I have reviewed the patients History and Physical, chart, labs and discussed the procedure including the risks, benefits and alternatives for the proposed anesthesia with the patient or authorized representative who has indicated his/her understanding and acceptance.     Plan Discussed with:   Anesthesia Plan Comments:         Anesthesia Quick Evaluation

## 2017-04-29 NOTE — Op Note (Signed)
04/29/2017  9:06 AM    [name]  [id number]   Pre-Op Dx:  Tracheostomy tube dislodgment  Post-op Dx: Same  Proc: Tracheostomy revision   Surg:  Jodi Marble T MD  Anes:  GOT  EBL:  None    Comp:  None  Findings:  Significant lower neck radiation fibrosis. A dimpled scar tract consistent with previous tracheostomy. Tract identified with a hemostat and dilated.  Procedure:  With the patient in a comfortable supine position, general orotracheal anesthesia was induced without difficulty.  A routine surgical timeout was obtained.  1% Xylocaine with 1 100,000 epinephrine, 4 mL total was used to infiltrate the wound site. Several minutes were allowed for hemostasis to take effect.  A sterile preparation and draping of the lower neck was performed in the standard fashion.  Using a fine mosquito hemostat, some granulation tissue was identified and probed. A tract into the lumen was identified. This was enlarged with a hemostat. A #4 cuffed was tracheostomy tube was placed into the tract.  A flexible laryngoscope was introduced through the tracheostomy tube and endotracheal tube cuff was identified. The cuff was deflated and the telescope was passed into the trachea proper level of the carina.  Tracheostomy tube was further inserted to its full depth. Using the flexible scope, the tracheostomy was observed to be in good position.   A tracheostomy dressing were applied and the tube was secured with cotton ties. Hemostasis was observed. The endotracheal tube was removed. The patient was breathing spontaneously with good tidal volume and good oxygen saturation.  PACU to home At this point the procedure was completed. The patient was returned to anesthesia, awakened, and transferred to recovery in stable condition.  Dispo:   PACU to home  Plan:  Trach care instructions, home health agency, and visiting nurses. Hypertension control.  Tyson Alias MD

## 2017-04-30 ENCOUNTER — Encounter: Payer: Self-pay | Admitting: *Deleted

## 2017-04-30 ENCOUNTER — Encounter: Payer: Self-pay | Admitting: General Practice

## 2017-04-30 ENCOUNTER — Inpatient Hospital Stay: Payer: Medicaid Other

## 2017-04-30 ENCOUNTER — Inpatient Hospital Stay (HOSPITAL_BASED_OUTPATIENT_CLINIC_OR_DEPARTMENT_OTHER): Payer: Medicaid Other | Admitting: Hematology and Oncology

## 2017-04-30 ENCOUNTER — Telehealth: Payer: Self-pay

## 2017-04-30 ENCOUNTER — Encounter (HOSPITAL_COMMUNITY): Payer: Self-pay | Admitting: Otolaryngology

## 2017-04-30 VITALS — BP 137/97 | HR 84 | Temp 98.7°F | Resp 18

## 2017-04-30 VITALS — BP 116/84 | HR 120 | Temp 98.9°F | Resp 24 | Ht 67.0 in | Wt 113.6 lb

## 2017-04-30 DIAGNOSIS — Z5111 Encounter for antineoplastic chemotherapy: Secondary | ICD-10-CM | POA: Diagnosis not present

## 2017-04-30 DIAGNOSIS — C062 Malignant neoplasm of retromolar area: Secondary | ICD-10-CM

## 2017-04-30 DIAGNOSIS — Z923 Personal history of irradiation: Secondary | ICD-10-CM

## 2017-04-30 DIAGNOSIS — R070 Pain in throat: Secondary | ICD-10-CM | POA: Diagnosis not present

## 2017-04-30 DIAGNOSIS — Z930 Tracheostomy status: Secondary | ICD-10-CM

## 2017-04-30 LAB — CMP (CANCER CENTER ONLY)
ALK PHOS: 94 U/L (ref 40–150)
ALT: 23 U/L (ref 0–55)
ANION GAP: 12 — AB (ref 3–11)
AST: 24 U/L (ref 5–34)
Albumin: 3.6 g/dL (ref 3.5–5.0)
BILIRUBIN TOTAL: 0.5 mg/dL (ref 0.2–1.2)
BUN: 4 mg/dL — AB (ref 7–26)
CHLORIDE: 90 mmol/L — AB (ref 98–109)
CO2: 25 mmol/L (ref 22–29)
Calcium: 10.2 mg/dL (ref 8.4–10.4)
Creatinine: 0.82 mg/dL (ref 0.70–1.30)
GFR, Est AFR Am: >60 mL/min (ref >60)
GFR, Estimated: >60 mL/min (ref >60)
GLUCOSE: 190 mg/dL — AB (ref 70–140)
Potassium: 3.9 mmol/L (ref 3.5–5.1)
SODIUM: 127 mmol/L — AB (ref 136–145)
TOTAL PROTEIN: 7.9 g/dL (ref 6.4–8.3)

## 2017-04-30 LAB — CBC WITH DIFFERENTIAL (CANCER CENTER ONLY)
BASOS ABS: 0 10*3/uL (ref 0.0–0.1)
Basophils Relative: 0 %
EOS PCT: 0 %
Eosinophils Absolute: 0 10*3/uL (ref 0.0–0.5)
HEMATOCRIT: 36.6 % — AB (ref 38.4–49.9)
Hemoglobin: 12.7 g/dL — ABNORMAL LOW (ref 13.0–17.1)
LYMPHS ABS: 0.3 10*3/uL — AB (ref 0.9–3.3)
LYMPHS PCT: 3 %
MCH: 34.9 pg — AB (ref 27.2–33.4)
MCHC: 34.6 g/dL (ref 32.0–36.0)
MCV: 100.8 fL — AB (ref 79.3–98.0)
Monocytes Absolute: 1.1 10*3/uL — ABNORMAL HIGH (ref 0.1–0.9)
Monocytes Relative: 10 %
NEUTROS ABS: 9.2 10*3/uL — AB (ref 1.5–6.5)
Neutrophils Relative %: 87 %
PLATELETS: 293 10*3/uL (ref 140–400)
RBC: 3.63 MIL/uL — AB (ref 4.20–5.82)
RDW: 17.4 % — ABNORMAL HIGH (ref 11.0–14.6)
WBC: 10.6 10*3/uL — AB (ref 4.0–10.3)

## 2017-04-30 LAB — MAGNESIUM: Magnesium: 1.6 mg/dL (ref 1.5–2.5)

## 2017-04-30 MED ORDER — SODIUM CHLORIDE 0.9 % IV SOLN
225.0000 mg/m2 | Freq: Once | INTRAVENOUS | Status: AC
  Administered 2017-04-30: 354 mg via INTRAVENOUS
  Filled 2017-04-30: qty 59

## 2017-04-30 MED ORDER — SODIUM CHLORIDE 0.9 % IV SOLN
585.0000 mg | Freq: Once | INTRAVENOUS | Status: AC
  Administered 2017-04-30: 590 mg via INTRAVENOUS
  Filled 2017-04-30: qty 59

## 2017-04-30 MED ORDER — PALONOSETRON HCL INJECTION 0.25 MG/5ML
INTRAVENOUS | Status: AC
  Filled 2017-04-30: qty 5

## 2017-04-30 MED ORDER — SODIUM CHLORIDE 0.9% FLUSH
10.0000 mL | INTRAVENOUS | Status: DC | PRN
  Administered 2017-04-30: 10 mL
  Filled 2017-04-30: qty 10

## 2017-04-30 MED ORDER — FAMOTIDINE IN NACL 20-0.9 MG/50ML-% IV SOLN
20.0000 mg | Freq: Once | INTRAVENOUS | Status: AC
Start: 2017-04-30 — End: 2017-04-30
  Administered 2017-04-30: 20 mg via INTRAVENOUS
  Filled 2017-04-30: qty 50

## 2017-04-30 MED ORDER — PALONOSETRON HCL INJECTION 0.25 MG/5ML
0.2500 mg | Freq: Once | INTRAVENOUS | Status: AC
  Administered 2017-04-30: 0.25 mg via INTRAVENOUS

## 2017-04-30 MED ORDER — DEXAMETHASONE SODIUM PHOSPHATE 100 MG/10ML IJ SOLN
20.0000 mg | Freq: Once | INTRAMUSCULAR | Status: AC
  Administered 2017-04-30: 20 mg via INTRAVENOUS
  Filled 2017-04-30: qty 2

## 2017-04-30 MED ORDER — HEPARIN SOD (PORK) LOCK FLUSH 100 UNIT/ML IV SOLN
500.0000 [IU] | Freq: Once | INTRAVENOUS | Status: AC | PRN
  Administered 2017-04-30: 500 [IU]
  Filled 2017-04-30: qty 5

## 2017-04-30 MED ORDER — SODIUM CHLORIDE 0.9 % IV SOLN
Freq: Once | INTRAVENOUS | Status: AC
  Administered 2017-04-30: 11:00:00 via INTRAVENOUS

## 2017-04-30 MED ORDER — DIPHENHYDRAMINE HCL 50 MG/ML IJ SOLN
INTRAMUSCULAR | Status: AC
  Filled 2017-04-30: qty 1

## 2017-04-30 MED ORDER — DIPHENHYDRAMINE HCL 50 MG/ML IJ SOLN
50.0000 mg | Freq: Once | INTRAMUSCULAR | Status: AC
  Administered 2017-04-30: 50 mg via INTRAVENOUS

## 2017-04-30 NOTE — Patient Instructions (Addendum)
[facility] Discharge Instructions for Patients Receiving Chemotherapy  Today you received the following chemotherapy agents Taxol,Carboplatin  To help prevent nausea and vomiting after your treatment, we encourage you to take your nausea medication as directed  If you develop nausea and vomiting that is not controlled by your nausea medication, call the clinic.   BELOW ARE SYMPTOMS THAT SHOULD BE REPORTED IMMEDIATELY:  *FEVER GREATER THAN 100.5 F  *CHILLS WITH OR WITHOUT FEVER  NAUSEA AND VOMITING THAT IS NOT CONTROLLED WITH YOUR NAUSEA MEDICATION  *UNUSUAL SHORTNESS OF BREATH  *UNUSUAL BRUISING OR BLEEDING  TENDERNESS IN MOUTH AND THROAT WITH OR WITHOUT PRESENCE OF ULCERS  *URINARY PROBLEMS  *BOWEL PROBLEMS  UNUSUAL RASH Items with * indicate a potential emergency and should be followed up as soon as possible.  Feel free to call the clinic should you have any questions or concerns. The clinic phone number is [phone].  Please show the [facility] at check-in to the Emergency Department and triage nurse.    Carboplatin injection What is this medicine? CARBOPLATIN (KAR boe pla tin) is a chemotherapy drug. It targets fast dividing cells, like cancer cells, and causes these cells to die. This medicine is used to treat ovarian cancer and many other cancers. This medicine may be used for other purposes; ask your health care provider or pharmacist if you have questions. COMMON BRAND NAME(S): Paraplatin What should I tell my health care provider before I take this medicine? They need to know if you have any of these conditions: -blood disorders -hearing problems -kidney disease -recent or ongoing radiation therapy -an unusual or allergic reaction to carboplatin, cisplatin, other chemotherapy, other medicines, foods, dyes, or preservatives -pregnant or trying to get pregnant -breast-feeding How should I use this medicine? This drug is usually given  as an infusion into a vein. It is administered in a hospital or clinic by a specially trained health care professional. Talk to your pediatrician regarding the use of this medicine in children. Special care may be needed. Overdosage: If you think you have taken too much of this medicine contact a poison control center or emergency room at once. NOTE: This medicine is only for you. Do not share this medicine with others. What if I miss a dose? It is important not to miss a dose. Call your doctor or health care professional if you are unable to keep an appointment. What may interact with this medicine? -medicines for seizures -medicines to increase blood counts like filgrastim, pegfilgrastim, sargramostim -some antibiotics like amikacin, gentamicin, neomycin, streptomycin, tobramycin -vaccines Talk to your doctor or health care professional before taking any of these medicines: -acetaminophen -aspirin -ibuprofen -ketoprofen -naproxen This list may not describe all possible interactions. Give your health care provider a list of all the medicines, herbs, non-prescription drugs, or dietary supplements you use. Also tell them if you smoke, drink alcohol, or use illegal drugs. Some items may interact with your medicine. What should I watch for while using this medicine? Your condition will be monitored carefully while you are receiving this medicine. You will need important blood work done while you are taking this medicine. This drug may make you feel generally unwell. This is not uncommon, as chemotherapy can affect healthy cells as well as cancer cells. Report any side effects. Continue your course of treatment even though you feel ill unless your doctor tells you to stop. In some cases, you may be given additional medicines to help with side effects. Follow  all directions for their use. Call your doctor or health care professional for advice if you get a fever, chills or sore throat, or other  symptoms of a cold or flu. Do not treat yourself. This drug decreases your body's ability to fight infections. Try to avoid being around people who are sick. This medicine may increase your risk to bruise or bleed. Call your doctor or health care professional if you notice any unusual bleeding. Be careful brushing and flossing your teeth or using a toothpick because you may get an infection or bleed more easily. If you have any dental work done, tell your dentist you are receiving this medicine. Avoid taking products that contain aspirin, acetaminophen, ibuprofen, naproxen, or ketoprofen unless instructed by your doctor. These medicines may hide a fever. Do not become pregnant while taking this medicine. Women should inform their doctor if they wish to become pregnant or think they might be pregnant. There is a potential for serious side effects to an unborn child. Talk to your health care professional or pharmacist for more information. Do not breast-feed an infant while taking this medicine. What side effects may I notice from receiving this medicine? Side effects that you should report to your doctor or health care professional as soon as possible: -allergic reactions like skin rash, itching or hives, swelling of the face, lips, or tongue -signs of infection - fever or chills, cough, sore throat, pain or difficulty passing urine -signs of decreased platelets or bleeding - bruising, pinpoint red spots on the skin, black, tarry stools, nosebleeds -signs of decreased red blood cells - unusually weak or tired, fainting spells, lightheadedness -breathing problems -changes in hearing -changes in vision -chest pain -high blood pressure -low blood counts - This drug may decrease the number of white blood cells, red blood cells and platelets. You may be at increased risk for infections and bleeding. -nausea and vomiting -pain, swelling, redness or irritation at the injection site -pain, tingling,  numbness in the hands or feet -problems with balance, talking, walking -trouble passing urine or change in the amount of urine Side effects that usually do not require medical attention (report to your doctor or health care professional if they continue or are bothersome): -hair loss -loss of appetite -metallic taste in the mouth or changes in taste This list may not describe all possible side effects. Call your doctor for medical advice about side effects. You may report side effects to FDA at 1-800-FDA-1088. Where should I keep my medicine? This drug is given in a hospital or clinic and will not be stored at home. NOTE: This sheet is a summary. It may not cover all possible information. If you have questions about this medicine, talk to your doctor, pharmacist, or health care provider.  2018 Elsevier/Gold Standard (2007-05-10 14:38:05)    Paclitaxel injection What is this medicine? PACLITAXEL (PAK li TAX el) is a chemotherapy drug. It targets fast dividing cells, like cancer cells, and causes these cells to die. This medicine is used to treat ovarian cancer, breast cancer, and other cancers. This medicine may be used for other purposes; ask your health care provider or pharmacist if you have questions. COMMON BRAND NAME(S): Onxol, Taxol What should I tell my health care provider before I take this medicine? They need to know if you have any of these conditions: -blood disorders -irregular heartbeat -infection (especially a virus infection such as chickenpox, cold sores, or herpes) -liver disease -previous or ongoing radiation therapy -an unusual or allergic  reaction to paclitaxel, alcohol, polyoxyethylated castor oil, other chemotherapy agents, other medicines, foods, dyes, or preservatives -pregnant or trying to get pregnant -breast-feeding How should I use this medicine? This drug is given as an infusion into a vein. It is administered in a hospital or clinic by a specially  trained health care professional. Talk to your pediatrician regarding the use of this medicine in children. Special care may be needed. Overdosage: If you think you have taken too much of this medicine contact a poison control center or emergency room at once. NOTE: This medicine is only for you. Do not share this medicine with others. What if I miss a dose? It is important not to miss your dose. Call your doctor or health care professional if you are unable to keep an appointment. What may interact with this medicine? Do not take this medicine with any of the following medications: -disulfiram -metronidazole This medicine may also interact with the following medications: -cyclosporine -diazepam -ketoconazole -medicines to increase blood counts like filgrastim, pegfilgrastim, sargramostim -other chemotherapy drugs like cisplatin, doxorubicin, epirubicin, etoposide, teniposide, vincristine -quinidine -testosterone -vaccines -verapamil Talk to your doctor or health care professional before taking any of these medicines: -acetaminophen -aspirin -ibuprofen -ketoprofen -naproxen This list may not describe all possible interactions. Give your health care provider a list of all the medicines, herbs, non-prescription drugs, or dietary supplements you use. Also tell them if you smoke, drink alcohol, or use illegal drugs. Some items may interact with your medicine. What should I watch for while using this medicine? Your condition will be monitored carefully while you are receiving this medicine. You will need important blood work done while you are taking this medicine. This medicine can cause serious allergic reactions. To reduce your risk you will need to take other medicine(s) before treatment with this medicine. If you experience allergic reactions like skin rash, itching or hives, swelling of the face, lips, or tongue, tell your doctor or health care professional right away. In some cases,  you may be given additional medicines to help with side effects. Follow all directions for their use. This drug may make you feel generally unwell. This is not uncommon, as chemotherapy can affect healthy cells as well as cancer cells. Report any side effects. Continue your course of treatment even though you feel ill unless your doctor tells you to stop. Call your doctor or health care professional for advice if you get a fever, chills or sore throat, or other symptoms of a cold or flu. Do not treat yourself. This drug decreases your body's ability to fight infections. Try to avoid being around people who are sick. This medicine may increase your risk to bruise or bleed. Call your doctor or health care professional if you notice any unusual bleeding. Be careful brushing and flossing your teeth or using a toothpick because you may get an infection or bleed more easily. If you have any dental work done, tell your dentist you are receiving this medicine. Avoid taking products that contain aspirin, acetaminophen, ibuprofen, naproxen, or ketoprofen unless instructed by your doctor. These medicines may hide a fever. Do not become pregnant while taking this medicine. Women should inform their doctor if they wish to become pregnant or think they might be pregnant. There is a potential for serious side effects to an unborn child. Talk to your health care professional or pharmacist for more information. Do not breast-feed an infant while taking this medicine. Men are advised not to father a child while  receiving this medicine. This product may contain alcohol. Ask your pharmacist or healthcare provider if this medicine contains alcohol. Be sure to tell all healthcare providers you are taking this medicine. Certain medicines, like metronidazole and disulfiram, can cause an unpleasant reaction when taken with alcohol. The reaction includes flushing, headache, nausea, vomiting, sweating, and increased thirst. The  reaction can last from 30 minutes to several hours. What side effects may I notice from receiving this medicine? Side effects that you should report to your doctor or health care professional as soon as possible: -allergic reactions like skin rash, itching or hives, swelling of the face, lips, or tongue -low blood counts - This drug may decrease the number of white blood cells, red blood cells and platelets. You may be at increased risk for infections and bleeding. -signs of infection - fever or chills, cough, sore throat, pain or difficulty passing urine -signs of decreased platelets or bleeding - bruising, pinpoint red spots on the skin, black, tarry stools, nosebleeds -signs of decreased red blood cells - unusually weak or tired, fainting spells, lightheadedness -breathing problems -chest pain -high or low blood pressure -mouth sores -nausea and vomiting -pain, swelling, redness or irritation at the injection site -pain, tingling, numbness in the hands or feet -slow or irregular heartbeat -swelling of the ankle, feet, hands Side effects that usually do not require medical attention (report to your doctor or health care professional if they continue or are bothersome): -bone pain -complete hair loss including hair on your head, underarms, pubic hair, eyebrows, and eyelashes -changes in the color of fingernails -diarrhea -loosening of the fingernails -loss of appetite -muscle or joint pain -red flush to skin -sweating This list may not describe all possible side effects. Call your doctor for medical advice about side effects. You may report side effects to FDA at 1-800-FDA-1088. Where should I keep my medicine? This drug is given in a hospital or clinic and will not be stored at home. NOTE: This sheet is a summary. It may not cover all possible information. If you have questions about this medicine, talk to your doctor, pharmacist, or health care provider.  2018 Elsevier/Gold  Standard (2014-12-04 19:58:00)

## 2017-04-30 NOTE — Progress Notes (Signed)
[facility] CSW Progress Notes  CSW verified w [place] [employer] that patient has full Medicaid and is qualified to [employer] transport for all medical and social service appointments.  Patient must schedule rides in advance (call [phone]) to schedule.  This is a free service and can transport patient both in county and out of county.  Patient advised about this service and encouraged to use.  Edwyna Shell, LCSW Clinical Social Worker Phone:  [phone]

## 2017-04-30 NOTE — Progress Notes (Signed)
[facility] CSW Progress Note  CSW spoke w support person, Bethena Roys Going, who states that patient needs help w getting to appointments.  "I can take him sometimes, but not always."  Patient used to ride [facility], would like to be referred once again.  CSW met w patient in infusion room, completed [facility] application and briefly assessed for other needs in the home.  Patient lives w son and his children, per support person patient has moved several times in recent past and has lived w various family members.  Patient also advised that he can ride Medicaid transport if he has full Medicaid, information given for this free service.  Discussed referral to [facility] - patient agreeable to this for additional case management help in the community.  Referral to [facility] and [facility] completed.  Edwyna Shell, LCSW Clinical Social Worker Phone:  [phone]

## 2017-05-01 NOTE — Progress Notes (Signed)
Oncology Nurse Navigator Documentation  To provide support, encouragement and care continuity, met with Lawrence Arias during f/u with Dr. Lebron Conners.  He was accompanied by friend Bethena Roys. We discussed yesterday's trach revision with ENT Dr. Erik Obey.  He noted he does not have trach supplies at home, is unsure of cleaning procedure.  He voiced understanding I will follow-up with Dr. Noreene Filbert office. He voiced understanding of importance to monitor temp daily, to report to ED if 100.4 or greater, to present blue Chemo Alert Card. He stated he is now living with son. I encouraged him to call me with needs/concerns.    [name], RN, BSN Head & Neck Oncology Nurse [place] at [place] [phone]

## 2017-05-04 ENCOUNTER — Telehealth: Payer: Self-pay | Admitting: *Deleted

## 2017-05-04 NOTE — Telephone Encounter (Signed)
-----   Message from Veverly Fells, RN sent at 04/30/2017  5:27 PM EDT ----- Regarding: Lawrence Arias 1st time follow-up Contact: [phone] [name], a pt of MD Perlov, received Taxol and Carboplatin for the first time yesterday. He tolerated his treatment well with no complaints.

## 2017-05-04 NOTE — Telephone Encounter (Signed)
TCT patient and to his significant other, Rogelia Boga to follow up with him after his 1st chemo on 04/30/17 with Taxol/Carbo. No answer at either # and no identified VM available either. Pt has follow up appt on 05/07/17

## 2017-05-06 ENCOUNTER — Encounter: Payer: Self-pay | Admitting: General Practice

## 2017-05-06 NOTE — Progress Notes (Signed)
[place] CSW Progress Notes  Call from Chriss Driver, Partnership for [facility]).  Patient has been assigned to Margrett Rud for outreach call, attempting to engage him in community case management services.  [facility] has been unable to reach him at # provided, was told patient did not live at this location any more.  CSW connected supervisor w Head/Neck Nurse Navigator, advised that patient has upcoming appointments at [facility], will continue to try to find accurate phone # for patient.  Edwyna Shell, LCSW Clinical Social Worker Phone:  [phone]

## 2017-05-07 ENCOUNTER — Encounter: Payer: Self-pay | Admitting: *Deleted

## 2017-05-07 ENCOUNTER — Encounter: Payer: Self-pay | Admitting: General Practice

## 2017-05-07 ENCOUNTER — Inpatient Hospital Stay (HOSPITAL_BASED_OUTPATIENT_CLINIC_OR_DEPARTMENT_OTHER): Payer: Medicaid Other | Admitting: Hematology and Oncology

## 2017-05-07 ENCOUNTER — Encounter: Payer: Self-pay | Admitting: Hematology and Oncology

## 2017-05-07 ENCOUNTER — Inpatient Hospital Stay: Payer: Medicaid Other

## 2017-05-07 ENCOUNTER — Telehealth: Payer: Self-pay

## 2017-05-07 VITALS — BP 112/89 | HR 126 | Temp 98.1°F | Resp 24 | Ht 67.0 in | Wt 111.2 lb

## 2017-05-07 DIAGNOSIS — E876 Hypokalemia: Secondary | ICD-10-CM | POA: Diagnosis not present

## 2017-05-07 DIAGNOSIS — Z5111 Encounter for antineoplastic chemotherapy: Secondary | ICD-10-CM | POA: Diagnosis not present

## 2017-05-07 DIAGNOSIS — R530 Neoplastic (malignant) related fatigue: Secondary | ICD-10-CM

## 2017-05-07 DIAGNOSIS — C062 Malignant neoplasm of retromolar area: Secondary | ICD-10-CM | POA: Diagnosis not present

## 2017-05-07 DIAGNOSIS — R070 Pain in throat: Secondary | ICD-10-CM

## 2017-05-07 DIAGNOSIS — Z930 Tracheostomy status: Secondary | ICD-10-CM

## 2017-05-07 DIAGNOSIS — F17210 Nicotine dependence, cigarettes, uncomplicated: Secondary | ICD-10-CM | POA: Diagnosis not present

## 2017-05-07 DIAGNOSIS — Z923 Personal history of irradiation: Secondary | ICD-10-CM

## 2017-05-07 DIAGNOSIS — Z79899 Other long term (current) drug therapy: Secondary | ICD-10-CM | POA: Diagnosis not present

## 2017-05-07 DIAGNOSIS — G893 Neoplasm related pain (acute) (chronic): Secondary | ICD-10-CM | POA: Diagnosis not present

## 2017-05-07 LAB — CMP (CANCER CENTER ONLY)
ALBUMIN: 3.6 g/dL (ref 3.5–5.0)
ALT: 20 U/L (ref 0–55)
ANION GAP: 14 — AB (ref 3–11)
AST: 22 U/L (ref 5–34)
Alkaline Phosphatase: 75 U/L (ref 40–150)
BILIRUBIN TOTAL: 0.6 mg/dL (ref 0.2–1.2)
BUN: 7 mg/dL (ref 7–26)
CHLORIDE: 83 mmol/L — AB (ref 98–109)
CO2: 30 mmol/L — ABNORMAL HIGH (ref 22–29)
Calcium: 10.3 mg/dL (ref 8.4–10.4)
Creatinine: 0.94 mg/dL (ref 0.70–1.30)
GFR, Est AFR Am: >60 mL/min (ref >60)
GFR, Estimated: >60 mL/min (ref >60)
GLUCOSE: 114 mg/dL (ref 70–140)
POTASSIUM: 2.7 mmol/L — AB (ref 3.5–5.1)
SODIUM: 127 mmol/L — AB (ref 136–145)
TOTAL PROTEIN: 8.3 g/dL (ref 6.4–8.3)

## 2017-05-07 LAB — CBC WITH DIFFERENTIAL (CANCER CENTER ONLY)
BAND NEUTROPHILS: 0 %
BLASTS: 0 %
Basophils Absolute: 0 10*3/uL (ref 0.0–0.1)
Basophils Relative: 10 %
EOS ABS: 0 10*3/uL (ref 0.0–0.5)
Eosinophils Relative: 4 %
HCT: 30.4 % — ABNORMAL LOW (ref 38.4–49.9)
Hemoglobin: 10.6 g/dL — ABNORMAL LOW (ref 13.0–17.1)
LYMPHS PCT: 49 %
Lymphs Abs: 0.1 10*3/uL — ABNORMAL LOW (ref 0.9–3.3)
MCH: 35.1 pg — AB (ref 27.2–33.4)
MCHC: 34.8 g/dL (ref 32.0–36.0)
MCV: 100.8 fL — AB (ref 79.3–98.0)
MONOS PCT: 17 %
Metamyelocytes Relative: 0 %
Monocytes Absolute: 0.1 10*3/uL (ref 0.1–0.9)
Myelocytes: 0 %
NEUTROS ABS: 0.1 10*3/uL — AB (ref 1.5–6.5)
Neutrophils Relative %: 20 %
OTHER: 0 %
PLATELETS: 283 10*3/uL (ref 140–400)
Promyelocytes Absolute: 0 %
RBC: 3.02 MIL/uL — AB (ref 4.20–5.82)
RDW: 15.3 % — AB (ref 11.0–14.6)
WBC Count: 0.3 10*3/uL — CL (ref 4.0–10.3)
nRBC: 0 /100 WBC

## 2017-05-07 LAB — MAGNESIUM: MAGNESIUM: 1.3 mg/dL — AB (ref 1.5–2.5)

## 2017-05-07 MED ORDER — POTASSIUM CHLORIDE ER 10 MEQ PO TBCR: 20.0000 meq | EXTENDED_RELEASE_TABLET | Freq: Two times a day (BID) | ORAL | 0 refills | Status: DC

## 2017-05-07 MED ORDER — SODIUM CHLORIDE 0.9 % IV SOLN
Freq: Once | INTRAVENOUS | Status: AC
  Administered 2017-05-07: 16:00:00 via INTRAVENOUS
  Filled 2017-05-07: qty 250

## 2017-05-07 MED ORDER — SODIUM CHLORIDE 0.9% FLUSH
10.0000 mL | INTRAVENOUS | Status: DC | PRN
  Administered 2017-05-07: 10 mL
  Filled 2017-05-07: qty 10

## 2017-05-07 MED ORDER — HEPARIN SOD (PORK) LOCK FLUSH 100 UNIT/ML IV SOLN
500.0000 [IU] | Freq: Once | INTRAVENOUS | Status: AC | PRN
  Administered 2017-05-07: 500 [IU]
  Filled 2017-05-07: qty 5

## 2017-05-07 NOTE — Progress Notes (Signed)
Pt declines AVS, but discussed neutropenic precautions and provided pt with a masks. Pt verbalized understanding of staying away from anyone who is sick, washing hands and food thoroughly, and cooking anything raw if possible. Slight wheezing noted post infusion. Pt verbalized that it is normal since he got his trach. "When I sit for a while the phlegm settles and I just needs to get up and walk around so that it will break up." Pt otherwise stable upon d/c. No distress noted in pt's breathing.

## 2017-05-07 NOTE — Progress Notes (Signed)
Met with patient to introduce myself as Arboriculturist and to discuss the one-time $400 [employer]. Based on patient's verbal income of $770 a month in disability, he qualifies. Patient currently does not have bills in his name and does not have a copay for medications. Patient states he is living with his son and is not a good situation for him to be able to get proper rest. He states he is looking for some place to move on his ow. I advised patient if he would like, he can save the funds to assist with this. He states that would be great. Gave him my card for any additional financial questions or concerns.

## 2017-05-07 NOTE — Telephone Encounter (Signed)
Per Dr. Lebron Conners patient to receive potassium and magnesium replacement intravenously. Patient over to the infusion area to receive magnesium and potassium today. Patient scheduled for remainder of potassium infusion on Saturday 05/08/2017. Patient made aware and verbalized understanding to come back on Saturday for another infusion. Patient also made aware of prescription for oral potassium sent to pharmacy by Dr. Lebron Conners.

## 2017-05-07 NOTE — Telephone Encounter (Signed)
Critical lab result received from [place] in the lab. WBC 0.31 and ANC 0.1. Potassium 2.7 and Magnesium 1.3. Dr. Lebron Conners made aware of results.

## 2017-05-07 NOTE — Progress Notes (Signed)
[place] CSW Progress Notes  CSW arranged [facility] transportation [facility].  Pick up will be 8 AM at [place]  Patient currently living w son at this address.  Edwyna Shell, LCSW Clinical Social Worker Phone:  [phone]

## 2017-05-07 NOTE — Patient Instructions (Addendum)
Hypomagnesemia Hypomagnesemia is a condition in which the level of magnesium in the blood is low. Magnesium is a mineral that is found in many foods. It is used in many different processes in the body. Hypomagnesemia can affect every organ in the body. It can cause life-threatening problems. What are the causes? Causes of hypomagnesemia include:  Not getting enough magnesium in your diet.  Malnutrition.  Problems with absorbing magnesium from the intestines.  Dehydration.  Alcohol abuse.  Vomiting.  Severe diarrhea.  Some medicines, including medicines that make you urinate more.  Certain diseases, such as kidney disease, diabetes, and overactive thyroid.  What are the signs or symptoms?  Involuntary shaking or trembling of a body part (tremor).  Confusion.  Muscle weakness.  Sensitivity to light, sound, and touch.  Psychiatric issues, such as depression, irritability, or psychosis.  Sudden tightening of muscles (muscle spasms).  Tingling in the arms and legs.  A feeling of fluttering of the heart. These symptoms are more severe if magnesium levels drop suddenly. How is this diagnosed? To make a diagnosis, your health care provider will do a physical exam and order blood and urine tests. How is this treated? Treatment will depend on the cause and the severity of your condition. It may involve:  A magnesium supplement. This can be taken in pill form. It can also be given through an IV tube. This is usually done if the condition is severe.  Changes to your diet. You may be directed to eat foods that have a lot of magnesium, such as green leafy vegetables, peas, beans, and nuts.  Eliminating alcohol from your diet.  Follow these instructions at home:  Include foods with magnesium in your diet. Foods that are rich in magnesium include green vegetables, beans, nuts and seeds, and whole grains.  Take medicines only as directed by your health care provider.  Take  magnesium supplements if your health care provider instructs you to do that. Take them as directed.  Have your magnesium levels monitored as directed by your health care provider.  When you are active, drink fluids that contain electrolytes.  Keep all follow-up visits as directed by your health care provider. This is important. Contact a health care provider if:  You get worse instead of better.  Your symptoms return. Get help right away if:  Your symptoms are severe. This information is not intended to replace advice given to you by your health care provider. Make sure you discuss any questions you have with your health care provider. Document Released: 10/29/2004 Document Revised: 07/11/2015 Document Reviewed: 09/18/2013 Elsevier Interactive Patient Education  2018 [employer].   Hypokalemia Hypokalemia means that the amount of potassium in the blood is lower than normal.Potassium is a chemical that helps regulate the amount of fluid in the body (electrolyte). It also stimulates muscle tightening (contraction) and helps nerves work properly.Normally, most of the body's potassium is inside of cells, and only a very small amount is in the blood. Because the amount in the blood is so small, minor changes to potassium levels in the blood can be life-threatening. What are the causes? This condition may be caused by:  Antibiotic medicine.  Diarrhea or vomiting. Taking too much of a medicine that helps you have a bowel movement (laxative) can cause diarrhea and lead to hypokalemia.  Chronic kidney disease (CKD).  Medicines that help the body get rid of excess fluid (diuretics).  Eating disorders, such as bulimia.  Low magnesium levels in the body.  Sweating a lot.  What are the signs or symptoms? Symptoms of this condition include:  Weakness.  Constipation.  Fatigue.  Muscle cramps.  Mental confusion.  Skipped heartbeats or irregular heartbeat  (palpitations).  Tingling or numbness.  How is this diagnosed? This condition is diagnosed with a blood test. How is this treated? Hypokalemia can be treated by taking potassium supplements by mouth or adjusting the medicines that you take. Treatment may also include eating more foods that contain a lot of potassium. If your potassium level is very low, you may need to get potassium through an IV tube in one of your veins and be monitored in the hospital. Follow these instructions at home:  Take over-the-counter and prescription medicines only as told by your health care provider. This includes vitamins and supplements.  Eat a healthy diet. A healthy diet includes fresh fruits and vegetables, whole grains, healthy fats, and lean proteins.  If instructed, eat more foods that contain a lot of potassium, such as: ? Nuts, such as peanuts and pistachios. ? Seeds, such as sunflower seeds and pumpkin seeds. ? Peas, lentils, and lima beans. ? Whole grain and bran cereals and breads. ? Fresh fruits and vegetables, such as apricots, avocado, bananas, cantaloupe, kiwi, oranges, tomatoes, asparagus, and potatoes. ? Orange juice. ? Tomato juice. ? Red meats. ? Yogurt.  Keep all follow-up visits as told by your health care provider. This is important. Contact a health care provider if:  You have weakness that gets worse.  You feel your heart pounding or racing.  You vomit.  You have diarrhea.  You have diabetes (diabetes mellitus) and you have trouble keeping your blood sugar (glucose) in your target range. Get help right away if:  You have chest pain.  You have shortness of breath.  You have vomiting or diarrhea that lasts for more than 2 days.  You faint. This information is not intended to replace advice given to you by your health care provider. Make sure you discuss any questions you have with your health care provider. Document Released: 02/02/2005 Document Revised: 09/21/2015  Document Reviewed: 09/21/2015 Elsevier Interactive Patient Education  2018 [employer].   Neutropenia Neutropenia is a condition that occurs when you have a lower-than-normal level of a type of white blood cell (neutrophil) in your body. Neutrophils are made in the spongy center of large bones (bone marrow) and they fight infections. Neutrophils are your body's main defense against bacterial and fungal infections. The fewer neutrophils you have and the longer your body remains without them, the greater your risk of getting a severe infection. What are the causes? This condition can occur if your body uses up or destroys neutrophils faster than your bone marrow can make them. This problem may happen because of:  Bacterial or fungal infection.  Allergic disorders.  Reactions to some medicines.  Autoimmune disease.  An enlarged spleen.  This condition can also occur if your bone marrow does not produce enough neutrophils. This problem may be caused by:  Cancer.  Cancer treatments, such as radiation or chemotherapy.  Viral infections.  Medicines, such as phenytoin.  Vitamin B12 deficiency.  Diseases of the bone marrow.  Environmental toxins, such as insecticides.  What are the signs or symptoms? This condition does not usually cause symptoms. If symptoms are present, they are usually caused by an underlying infection. Symptoms of an infection may include:  Fever.  Chills.  Swollen glands.  Oral or anal ulcers.  Cough and shortness of  breath.  Rash.  Skin infection.  Fatigue.  How is this diagnosed? Your health care provider may suspect neutropenia if you have:  A condition that may cause neutropenia.  Symptoms of infection, especially fever.  Frequent and unusual infections.  You will have a medical history and physical exam. Tests will also be done, such as:  A complete blood count (CBC).  A procedure to collect a sample of bone marrow for  examination (bone marrow biopsy).  A chest X-ray.  A urine culture.  A blood culture.  How is this treated? Treatment depends on the underlying cause and severity of your condition. Mild neutropenia may not require treatment. Treatment may include medicines, such as:  Antibiotic medicine given through an IV tube.  Antiviral medicines.  Antifungal medicines.  A medicine to increase neutrophil production (colony-stimulating factor). You may get this drug through an IV tube or by injection.  Steroids given through an IV tube.  If an underlying condition is causing neutropenia, you may need treatment for that condition. If medicines you are taking are causing neutropenia, your health care provider may have you stop taking those medicines. Follow these instructions at home: Medicines  Take over-the-counter and prescription medicines only as told by your health care provider.  Get a seasonal flu shot (influenza vaccine). Lifestyle  Do not eat unpasteurized foods.Do not eat unwashed raw fruits or vegetables.  Avoid exposure to groups of people or children.  Avoid being around people who are sick.  Avoid being around dirt or dust, such as in construction areas or gardens.  Do not provide direct care for pets. Avoid animal droppings. Do not clean litter boxes and bird cages. Hygiene   Bathe daily.  Clean the area between the genitals and the anus (perineal area) after you urinate or have a bowel movement. If you are male, wipe from front to back.  Brush your teeth with a soft toothbrush before and after meals.  Do not use a razor that has a blade. Use an electric razor to remove hair.  Wash your hands often. Make sure others who come in contact with you also wash their hands. If soap and water are not available, use hand sanitizer. General instructions  Do not have sex unless your health care provider has approved.  Take actions to avoid cuts and burns. For  example: ? Be cautious when you use knives. Always cut away from yourself. ? Keep knives in protective sheaths or guards when not in use. ? Use oven mitts when you cook with a hot stove, oven, or grill. ? Stand a safe distance away from open fires.  Avoid people who received a vaccine in the past 30 days if that vaccine contained a live version of the germ (live vaccine). You should not get a live vaccine. Common live vaccines are varicella, measles, mumps, and rubella.  Do not share food utensils.  Do not use tampons, enemas, or rectal suppositories unless your health care provider has approved.  Keep all appointments as told by your health care provider. This is important. Contact a health care provider if:  You have a fever.  You have chills or you start to shake.  You have: ? A sore throat. ? A warm, red, or tender area on your skin. ? A cough. ? Frequent or painful urination. ? Vaginal discharge or itching.  You develop: ? Sores in your mouth or anus. ? Swollen lymph nodes. ? Red streaks on the skin. ? A rash.  You feel: ? Nauseous or you vomit. ? Very fatigued. ? Short of breath. This information is not intended to replace advice given to you by your health care provider. Make sure you discuss any questions you have with your health care provider. Document Released: 07/25/2001 Document Revised: 07/11/2015 Document Reviewed: 08/15/2014 Elsevier Interactive Patient Education  [employer].

## 2017-05-08 ENCOUNTER — Inpatient Hospital Stay: Payer: Medicaid Other

## 2017-05-08 NOTE — Progress Notes (Signed)
Patient arrived to infusion to receive IVF with Potassium today. No IVF with Potassium is available today due to limited staffing (no pharmacy) at [facility]. [facility] consulted. Per MD Alen Blew pt to be discharged and continue oral potassium at home as prescribed. Per MD Shadad, pt does not need IVF of NS today. Pt verbalized understanding and agrees with plan of care. Pt aware that MD Perlov's office will follow-up with him.

## 2017-05-08 NOTE — Progress Notes (Addendum)
Oncology Nurse Navigator Documentation  To provide support, encouragement and care continuity, met with Mr. Diesing during Est Pt appt with Dr. Lebron Conners.  I reminded him of 0800 arrival for next Tuesday morning's H&N [facility], Dr. Lebron Conners reinforced importance of attending.  He voiced understanding.  He explained late arrival for appts b/c had to take bus.  I reminded him of 3 transportation options currently available and previously communicated to him by LCSW Edwyna Shell, : [facility], [facility],  [facility]. Provided him [facility] Pass given to me by Webb Silversmith.  Webb Silversmith met with Mr. Whidbee following visit with Dr. Lebron Conners, provided phone #s for [facility], [facility], informed him transportation arranged with [facility] for Tuesday's [place].  She also informed him RTC available for transport to his appt with Dr. Donzetta Kohut, [facility], next Wed, encouraged him to call this afternoon to arrange.  He voiced understanding.  Mr. Shanholtzer confirmed understanding of 3/25 1:00 home visit by Margrett Rud, Partnership for [facility]. I encouraged Mr. Koren to call me with needs/concerns.  [name], RN, BSN Head & Neck Oncology Nurse [place] at [place] [phone]

## 2017-05-09 NOTE — Progress Notes (Signed)
[facility] Follow-up Visit:  Assessment: Squamous cell cancer of retromolar trigone ([place]) 57 y.o.  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  Patient was supposed to be on systemic chemotherapy, but did not follow-up with any of the scheduled appointments in the beginning of the treatment necessitating discontinuation of the systemic therapy plan.  Later in the course of radiation, patient returned requesting initiation of systemic therapy, but my opinion, the benefit was minimal at that point and I did not proceed with any systemic treatment considering patient had less than a week left of radiation at that time.  Restaging imaging of neck and chest is suggestive of possible disease recurrence. Patient presents to the clinic today with complaints of oral and throat pain.  Examination confirms progressive swelling and likely progressive malignancy in the neck based on evaluation and palpation.  Presently, he is potential follow-up at [facility] and scheduled on 05/12/17 which is likely too far removed due to visibly progressive malignancy.  My preference at this time is to proceed with palliative systemic chemotherapy combining carboplatin and paclitaxel given in absence of direct tissue confirmation of recurrent malignancy as the clinical picture is pathognomonic.  Respiration has improved significantly after replacement of tracheostomy device.  Today, I have reviewed the palliative nature of the administered chemotherapy, component medications, other schedule of administration, and some of the most likely toxicities that may occur during the therapy.  Patient voiced agreement to proceed with systemic therapy as recommended.  Plan: -Continue OxyContin/oxycodone combination for pain control. -Proceed with cycle #1 of carboplatin/paclitaxel administered every 3 weeks -Return to my clinic in 1 week  for toxicity monitoring. -Next scheduled disease assessment will occur after 3 cycles of systemic chemotherapy with CT of the neck and chest.  Voice recognition software was used and creation of this note. Despite my best effort at editing the text, some misspelling/errors may have occurred.  Orders Placed This Encounter  Procedures  . CBC with Differential (Cancer Center Only)    Standing Status:   Future    Number of Occurrences:   1    Standing Expiration Date:   05/01/2018  . CMP ([place] only)    Standing Status:   Future    Number of Occurrences:   1    Standing Expiration Date:   05/01/2018  . Magnesium    Standing Status:   Future    Number of Occurrences:   1    Standing Expiration Date:   04/30/2018    Cancer Staging Oral cancer [facility]) Staging form: Oral Cavity, AJCC 8th Edition - Clinical: Stage III (cT3, cN1, cM0) - Signed by Eppie Gibson, MD on 08/25/2016  Squamous cell cancer of retromolar trigone ([place]) Staging form: Oral Cavity, AJCC 8th Edition - Pathologic stage from 10/16/2016: Stage IVB (pT2, pN3b, cM0) - Unsigned   All questions were answered.  . The patient knows to call the clinic with any problems, questions or concerns.  This note was electronically signed.    History of Presenting Illness Lawrence Arias is a 57 y.o. followed in the [place] for diagnosis of squamous cell carcinoma of the left retromolar trigone. Since the diagnosis in Jul 2018, patient underwent surgery on 10/16/16 including graft tissue placement. Recovered well from the operation, and he is referred to medical oncology for possible systemic chemotherapy as an adjunctive for the adjuvant radiation therapy. His primary ENT is Dr. Janace Hoard and Radiation  Oncologist is Dr Isidore Moos.  Due to concerns for residual/recurrent disease in the surgical bed, patient was recommended to undergo chemoradiotherapy and was planned for the treatment in mid October 2018.  Nevertheless, he did not  show up for several appointments with our clinic and eventually we made decision to forego chemotherapy.  Patient is nearing completion of his radiation therapy and tolerated it quite well so far.   Patient returns to the clinic to initiate palliative systemic therapy with carboplatin and paclitaxel.  Yesterday, patient underwent replacement of his tracheostomy due to obstruction caused by him not adequately flushing/cleaning the device.  It appears the cannula itself ended up dislodged and patient had the entire apparatus replaced by Dr. Erik Obey.  At the present time, reports breathing better, still feels short of breath.  Continues to have significant amount of pain in the left neck and face.  Oncological/hematological History:   Squamous cell cancer of retromolar trigone ([place])   07/24/2016 Initial Diagnosis    Squamous cell cancer of retromolar trigone [facility]): Initial biopsy demonstrating moderately differentiated invasive squamous cell carcinoma.       08/14/2016 Imaging    CT neck: Nonvisualization of left retromolar trigone mucosal lesion. No evidence of invasion into the adjacent structures. 2. Left level IIa lymph node measuring 1 cm, likely indicating spread of disease. No contralateral adenopathy.      09/29/2016 Imaging    CT Neck [facility]): Left-sided pathological lymphadenopathy of metastatic disease at level IB, IIA. Possible involvement of level IA, level III, and level IV on the left side.      10/16/2016 Surgery    Dr Harriett Rush Sunny Schlein [place]  [facility]): LEFT TONSIL, SOFT PALATE, FLOOR OF MOUTH, BASE OF TONGUE, AND MANDIBLE, PARTIAL GLOSSOPHARYNGECTOMY AND PARTIAL MANDIBULECTOMY, LEFT PARAPHARYNGEAL MASS, EXCISION, TEETH, REMOVAL       10/16/2016 Pathology Results    Surgical Pathology:  LEFT TONSIL, SOFT PALATE, FLOOR OF MOUTH, BASE OF TONGUE, AND MANDIBLE, PARTIAL GLOSSOPHARYNGECTOMY AND PARTIAL MANDIBULECTOMY: Invasive squamous cell carcinoma, keratinizing type, moderately  differentiated, is identified forming a 3.7x1.8x1.0cm mass in the left retromolar area.Tumor invades to a depth of 0.8 cm.Lymphovascular invasion is identified. Perineural invasion is identified. The medial soft tissue margin is positive for invasive squamous cell carcinoma (focal). LEFT PARAPHARYNGEAL MASS, EXCISION: Invasive squamous cell carcinoma forming a 1.2x1.2x0.5cm mass. Invasive carcinoma involves the unoriented soft tissue margin over a linear length of 0.3 cm. TEETH, REMOVAL: Two teeth (gross examination only).  Margins: "TUMOR NEXT TO BONE", EXCISION: Scant fragments of bone and dense fibrous tissue, negative for carcinoma. "PHARYNGEAL WALL MARGIN", EXCISION: Squamous mucosa with acute inflammation and skeletal muscle, negative for carcinoma. "HARD PALATE MARGIN", EXCISION: Squamous mucosa, negative for carcinoma. "TONGUE BASE MARGIN", EXCISION: Squamous mucosa with chronic inflammation, negative for carcinoma. "INFERIOR BUCCAL MUCOSA MARGIN", EXCISION: Squamous mucosa and skeletal muscle, negative for carcinoma. "SUPERIOR BUCCAL MUCOSAL MARGIN", EXCISION: Squamous mucosa with chronic inflammation, negative for carcinoma.  Lymph nodes:  LEVEL 1A NECK CONTENTS, LYMPH NODE DISSECTION: Three lymph nodes, negative for metastatic carcinoma (0/3). LEFT LEVEL 1B NECK CONTENTS, LYMPH NODE DISSECTION: Metastatic carcinoma in two of five lymph nodes (2/5). Largest metastatic deposit measures 2.4 cm. Definitive extranodal extension is not identified. Salivary gland parenchyma, negative for tumor. LEFT LEVEL 2B NECK CONTENTS, LYMPH NODE DISSECTION: Eight lymph nodes, negative for metastatic carcinoma (0/8). LEFT LEVEL 2A, 3, 4, AND 5 NECK CONTENTS, LYMPH NODE DISSECTION: Metastatic carcinoma in one of twenty-two lymph nodes (1/22). Largest metastatic deposit measures 3.5 cm. Extranodal  extension is present and measures 1.5 mm beyond the lymph node capsule.  COMMENT: Immunohistochemical staining for  p16 (Block A4) shows the invasive carcinoma is diffusely positive for p16.The positive control stains appropriately.      11/04/2016 Imaging    CT Neck [facility]): No pathological lymphadenopathy. Surgical changes noted. Significant fluid collection in the left upper neck with internal gas and significant soft tissue swelling.      11/30/2016 Imaging    CT Neck: Postop resection of retromolar trigone carcinoma on the left with free flap. Postsurgical changes left neck without evidence of recurrent tumor.   CT Chest: No suspicious pulmonary nodules or masses identified. No thoracic adenopathy.      12/31/2016 - 02/23/2017 Radiation Therapy    Left oral cavity and regional nodes: --66Gy/33 fx        03/25/2017 Relapse/Recurrence    CT Neck & Chest: Likely recurrent disease in the neck with left submandibular mass measuring 3.0 cm.  Multiple new small pulmonary nodules worrisome for possible metastatic disease.       Medical History: Past Medical History:  Diagnosis Date  . Cancer ([place])   . GERD (gastroesophageal reflux disease)    uses OTC prn  . History of radiation therapy 12/31/16- 02/23/2017   Left oral cavity and regional nodes, 66 Gy in 33 fractions.   . Hypertension   . Tongue lesion     Surgical History: Past Surgical History:  Procedure Laterality Date  . DIRECT LARYNGOSCOPY N/A 07/24/2016   Procedure: DIRECT LARYNGOSCOPY;  Surgeon: Melissa Montane, MD;  Location: [place];  Service: ENT;  Laterality: N/A;  Direct laryngoscopy with biopsy tongue lesion  . FRACTURE SURGERY     left wrist, remote  . FREE FLAP RADIAL FOREARM Left 10/16/2016   Dr. Hendricks Limes [facility]  . free flap scapular Left 10/16/2016   Dr. Hendricks Limes - [facility]  . GASTROSTOMY  10/27/2016   Dr. Daphene Calamity- [facility].   . IR FLUORO GUIDE PORT INSERTION RIGHT  12/22/2016  . IR US GUIDE VASC ACCESS RIGHT  12/22/2016  . NECK DISSECTION  Bilateral 10/16/2016   Selective neck dissection, Dr. Hendricks Limes [facility]  . 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 Hendricks Limes [facility]  . SKIN GRAFT Left 10/16/2016   Split thickness skin graft, leg Dr. Hendricks Limes. [facility]  . TONGUE BIOPSY  07/24/2016   Procedure: TONGUE BIOPSY;  Surgeon: Melissa Montane, MD;  Location: [place];  Service: ENT;;  . TRACHEOSTOMY  10/16/2016   Dr. Hendricks Limes [facility]  . TRACHEOSTOMY TUBE PLACEMENT N/A 04/29/2017   Procedure: TRACHEOSTOMY REVISION;  Surgeon: Jodi Marble, MD;  Location[facility];  Service: ENT;  Laterality: N/A;    Family History: Family History  Problem Relation Age of Onset  . Hypertension Mother   . Hypertension Father     Social History: Social History   Socioeconomic History  . Marital status: Single    Spouse name: Not on file  . Number of children: 3  . Years of education: Not on file  . Highest education level: Not on file  Occupational History  . Not on file  Social Needs  . Financial resource strain: Not on file  . Food insecurity:    Worry: Not on file    Inability: Not on file  . Transportation needs:    Medical: Not on file    Non-medical: Not on file  Tobacco Use  .  Smoking status: Current Some Day Smoker    Packs/day: 0.50    Types: Cigarettes  . Smokeless tobacco: Never Used  . Tobacco comment: he is smoking 0ne every other day (11/20/16)  Substance and Sexual Activity  . Alcohol use: Not on file  . Drug use: No  . Sexual activity: Not on file  Lifestyle  . Physical activity:    Days per week: Not on file    Minutes per session: Not on file  . Stress: Not on file  Relationships  . Social connections:    Talks on phone: Not on file    Gets together: Not on file    Attends religious service: Not on file    Active member of club or organization: Not on file    Attends meetings of clubs or organizations: Not on file    Relationship status: Not on file  . Intimate partner violence:    Fear of  current or ex partner: Not on file    Emotionally abused: Not on file    Physically abused: Not on file    Forced sexual activity: Not on file  Other Topics Concern  . Not on file  Social History Narrative  . Not on file    Allergies: No Known Allergies  Medications:  Current Outpatient Medications  Medication Sig Dispense Refill  . amLODipine (NORVASC) 5 MG tablet Take 5 mg by mouth daily.  0  . buPROPion (WELLBUTRIN SR) 150 MG 12 hr tablet Start one week before quit date. Take 1 tab daily x 3 days, then 1 tab BID thereafter. 60 tablet 2  . lidocaine (XYLOCAINE) 2 % solution Use as directed 20 mLs in the mouth or throat as needed for mouth pain. 100 mL 0  . nicotine (NICODERM CQ - DOSED IN MG/24 HOURS) 14 mg/24hr patch Place 1 patch (14 mg total) onto the skin daily. Apply 21 mg patch daily x 6 wk, then '14mg'$  patch daily x 2 wk, then 7 mg patch daily x 2 wk 14 patch 0  . oxyCODONE (OXYCONTIN) 10 mg 12 hr tablet Take 1 tablet (10 mg total) by mouth every 12 (twelve) hours. 60 tablet 0  . acetaminophen (TYLENOL) 500 MG tablet Take 1,000 mg by mouth every 6 (six) hours as needed for moderate pain.    Marland Kitchen gabapentin (NEURONTIN) 300 MG capsule Take 1 capsule (300 mg total) by mouth 3 (three) times daily. To address cancer related pain. (Patient not taking: Reported on 04/29/2017) 90 capsule 0  . nicotine (NICODERM CQ - DOSED IN MG/24 HR) 7 mg/24hr patch Place 1 patch (7 mg total) onto the skin daily. Apply 21 mg patch daily x 6 wk, then '14mg'$  patch daily x 2 wk, then 7 mg patch daily x 2 wk (Patient not taking: Reported on 04/29/2017) 14 patch 0  . polyethylene glycol powder (MIRALAX) powder Use as directed for constipation. Follow directions on container. (Patient not taking: Reported on 03/19/2017) 850 g 3  . potassium chloride (K-DUR) 10 MEQ tablet Take 2 tablets (20 mEq total) by mouth 2 (two) times daily for 14 days. 56 tablet 0   No current facility-administered medications for this visit.     Facility-Administered Medications Ordered in Other Visits  Medication Dose Route Frequency Provider Last Rate Last Dose  . alteplase (CATHFLO ACTIVASE) injection 2 mg  2 mg Intracatheter Once PRN Ardath Sax, MD      . heparin lock flush 100 unit/mL  250 Units Intracatheter PRN  Ardath Sax, MD      . heparin lock flush 100 unit/mL  500 Units Intracatheter PRN Hitesh Fouche, Marinell Blight, MD      . sodium chloride flush (NS) 0.9 % injection 10 mL  10 mL Intracatheter PRN Lebron Conners, Marinell Blight, MD        Review of Systems: Review of Systems  Constitutional: Positive for fatigue.  HENT:   Positive for lump/mass, sore throat and trouble swallowing.   Respiratory: Positive for shortness of breath. Negative for hemoptysis.   All other systems reviewed and are negative.    PHYSICAL EXAMINATION Blood pressure 116/84, pulse (!) 120, temperature 98.9 F (37.2 C), temperature source Oral, resp. rate (!) 24, height '5\' 7"'$  (1.702 m), weight 113 lb 9.6 oz (51.5 kg), SpO2 95 %.  ECOG PERFORMANCE STATUS: 2 - Symptomatic, <50% confined to bed  Physical Exam  Constitutional: He is oriented to person, place, and time and well-developed, well-nourished, and in no distress. No distress.  HENT:  Mouth/Throat: No oropharyngeal exudate.  Progression of left facial and cervical swelling with progressive palpable masses in the left neck including several dermal lesions including one with ulceration.  Tracheostomy in place with thick nonpurulent secretions.  Eyes: Pupils are equal, round, and reactive to light. EOM are normal. No scleral icterus.  Neck: Normal range of motion. Neck supple. No thyromegaly present.  Cardiovascular: Normal rate, regular rhythm and intact distal pulses.  No murmur heard. Pulmonary/Chest: Effort normal and breath sounds normal. No respiratory distress. He has no wheezes. He has no rales.  Abdominal: Soft. Bowel sounds are normal. He exhibits no distension and no mass. There is  no tenderness. There is no rebound and no guarding.  Musculoskeletal: Normal range of motion. He exhibits no edema.  Lymphadenopathy:    He has no cervical adenopathy.  Neurological: He is alert and oriented to person, place, and time. He has normal reflexes. No cranial nerve deficit. Coordination normal.  Skin: Skin is dry. No rash noted. He is not diaphoretic. No erythema. No pallor.     LABORATORY DATA: I have personally reviewed the data as listed: Appointment on 04/30/2017  Component Date Value Ref Range Status  . Magnesium 04/30/2017 1.6  1.5 - 2.5 mg/dL Final   Performed at [facility], [place] [address], [place], [place] [address]  . Sodium 04/30/2017 127* 136 - 145 mmol/L Final  . Potassium 04/30/2017 3.9  3.5 - 5.1 mmol/L Final  . Chloride 04/30/2017 90* 98 - 109 mmol/L Final  . CO2 04/30/2017 25  22 - 29 mmol/L Final  . Glucose, Bld 04/30/2017 190* 70 - 140 mg/dL Final  . BUN 04/30/2017 4* 7 - 26 mg/dL Final  . Creatinine 04/30/2017 0.82  0.70 - 1.30 mg/dL Final  . Calcium 04/30/2017 10.2  8.4 - 10.4 mg/dL Final  . Total Protein 04/30/2017 7.9  6.4 - 8.3 g/dL Final  . Albumin 04/30/2017 3.6  3.5 - 5.0 g/dL Final  . AST 04/30/2017 24  5 - 34 U/L Final  . ALT 04/30/2017 23  0 - 55 U/L Final  . Alkaline Phosphatase 04/30/2017 94  40 - 150 U/L Final  . Total Bilirubin 04/30/2017 0.5  0.2 - 1.2 mg/dL Final  . GFR, Est Non Af Am 04/30/2017 >60  >60 mL/min Final  . GFR, Est AFR Am 04/30/2017 >60  >60 mL/min Final   Comment: (NOTE) The eGFR has been calculated using the CKD EPI equation. This calculation has not been  validated in all clinical situations. eGFR's persistently <60 mL/min signify possible Chronic Kidney Disease.   Georgiann Hahn gap 04/30/2017 12* 3 - 11 Final   Performed at [facility], [place] [address], [place], [place] [address]  . WBC Count 04/30/2017 10.6* 4.0 - 10.3 K/uL Final  . RBC 04/30/2017 3.63* 4.20 - 5.82  MIL/uL Final  . Hemoglobin 04/30/2017 12.7* 13.0 - 17.1 g/dL Final  . HCT 04/30/2017 36.6* 38.4 - 49.9 % Final  . MCV 04/30/2017 100.8* 79.3 - 98.0 fL Final  . MCH 04/30/2017 34.9* 27.2 - 33.4 pg Final  . MCHC 04/30/2017 34.6  32.0 - 36.0 g/dL Final  . RDW 04/30/2017 17.4* 11.0 - 14.6 % Final  . Platelet Count 04/30/2017 293  140 - 400 K/uL Final  . Neutrophils Relative % 04/30/2017 87  % Final  . Neutro Abs 04/30/2017 9.2* 1.5 - 6.5 K/uL Final  . Lymphocytes Relative 04/30/2017 3  % Final  . Lymphs Abs 04/30/2017 0.3* 0.9 - 3.3 K/uL Final  . Monocytes Relative 04/30/2017 10  % Final  . Monocytes Absolute 04/30/2017 1.1* 0.1 - 0.9 K/uL Final  . Eosinophils Relative 04/30/2017 0  % Final  . Eosinophils Absolute 04/30/2017 0.0  0.0 - 0.5 K/uL Final  . Basophils Relative 04/30/2017 0  % Final  . Basophils Absolute 04/30/2017 0.0  0.0 - 0.1 K/uL Final   Performed at [facility], [place] [address] [place], [place] [address]  Admission on 04/29/2017, Discharged on 04/29/2017  Component Date Value Ref Range Status  . WBC 04/29/2017 5.3  4.0 - 10.5 K/uL Final  . RBC 04/29/2017 3.27* 4.22 - 5.81 MIL/uL Final  . Hemoglobin 04/29/2017 11.2* 13.0 - 17.0 g/dL Final  . HCT 04/29/2017 31.5* 39.0 - 52.0 % Final  . MCV 04/29/2017 96.3  78.0 - 100.0 fL Final  . MCH 04/29/2017 34.3* 26.0 - 34.0 pg Final  . MCHC 04/29/2017 35.6  30.0 - 36.0 g/dL Final  . RDW 04/29/2017 15.2  11.5 - 15.5 % Final  . Platelets 04/29/2017 292  150 - 400 K/uL Final   Performed at [facility], [place] [address], [place], [place] [address]  . Sodium 04/29/2017 121* 135 - 145 mmol/L Final  . Potassium 04/29/2017 3.5  3.5 - 5.1 mmol/L Final  . Chloride 04/29/2017 85* 101 - 111 mmol/L Final  . CO2 04/29/2017 24  22 - 32 mmol/L Final  . Glucose, Bld 04/29/2017 105* 65 - 99 mg/dL Final  . BUN 04/29/2017 <5* 6 - 20 mg/dL Final  . Creatinine, Ser 04/29/2017 0.58* 0.61 - 1.24 mg/dL Final  . Calcium  04/29/2017 9.6  8.9 - 10.3 mg/dL Final  . GFR calc non Af Amer 04/29/2017 >60  >60 mL/min Final  . GFR calc Af Amer 04/29/2017 >60  >60 mL/min Final   Comment: (NOTE) The eGFR has been calculated using the CKD EPI equation. This calculation has not been validated in all clinical situations. eGFR's persistently <60 mL/min signify possible Chronic Kidney Disease.   Georgiann Hahn gap 04/29/2017 12  5 - 15 Final   Performed at [place] [address] [place], [place] [address]       Ardath Sax, MD

## 2017-05-09 NOTE — Assessment & Plan Note (Signed)
57 y.o.  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  Patient was supposed to be on systemic chemotherapy, but did not follow-up with any of the scheduled appointments in the beginning of the treatment necessitating discontinuation of the systemic therapy plan.  Later in the course of radiation, patient returned requesting initiation of systemic therapy, but my opinion, the benefit was minimal at that point and I did not proceed with any systemic treatment considering patient had less than a week left of radiation at that time.  Restaging imaging of neck and chest is suggestive of possible disease recurrence. Patient presents to the clinic today with complaints of oral and throat pain.  Examination confirms progressive swelling and likely progressive malignancy in the neck based on evaluation and palpation.  Presently, he is potential follow-up at [facility] and scheduled on 05/12/17 which is likely too far removed due to visibly progressive malignancy.  My preference at this time is to proceed with palliative systemic chemotherapy combining carboplatin and paclitaxel given in absence of direct tissue confirmation of recurrent malignancy as the clinical picture is pathognomonic.  Respiration has improved significantly after replacement of tracheostomy device.  Today, I have reviewed the palliative nature of the administered chemotherapy, component medications, other schedule of administration, and some of the most likely toxicities that may occur during the therapy.  Patient voiced agreement to proceed with systemic therapy as recommended.  Plan: -Continue OxyContin/oxycodone combination for pain control. -Proceed with cycle #1 of carboplatin/paclitaxel administered every 3 weeks -Return to my clinic in 1 week for toxicity monitoring. -Next scheduled disease assessment will occur after 3 cycles of systemic chemotherapy with  CT of the neck and chest.

## 2017-05-10 ENCOUNTER — Telehealth: Payer: Self-pay | Admitting: Hematology and Oncology

## 2017-05-10 ENCOUNTER — Telehealth: Payer: Self-pay | Admitting: *Deleted

## 2017-05-10 ENCOUNTER — Encounter: Payer: Self-pay | Admitting: *Deleted

## 2017-05-10 NOTE — Progress Notes (Signed)
[place] [employer] representative spoke with patient and [facility] to confirm transportation for his appointment tomorrow.  Patient spoke with [facility] and is aware of transportation.    Johnnye Lana, MSW, LCSW, OSW-C Clinical Social Worker [facility] [phone]

## 2017-05-10 NOTE — Telephone Encounter (Signed)
Oncology Nurse Navigator Documentation  Spoke with Mr. Mcneill, confirmed his understanding of 0800 arrival tomorrow for H&N MDC.  [name], RN, BSN Head & Neck Oncology Nurse [place] at [place] [phone]

## 2017-05-10 NOTE — Telephone Encounter (Signed)
Scheduled appt per 3/25 sch msg - left voicemail for patient regarding appts.

## 2017-05-11 ENCOUNTER — Other Ambulatory Visit: Payer: Self-pay

## 2017-05-11 ENCOUNTER — Ambulatory Visit
Admission: RE | Admit: 2017-05-11 | Discharge: 2017-05-11 | Disposition: A | Discharge status: Home / Self Care | Payer: Medicaid Other | Source: Ambulatory Visit | Attending: Radiation Oncology | Admitting: Radiation Oncology

## 2017-05-11 ENCOUNTER — Encounter: Payer: Self-pay | Admitting: *Deleted

## 2017-05-11 ENCOUNTER — Inpatient Hospital Stay: Payer: Medicaid Other | Admitting: Nutrition

## 2017-05-11 ENCOUNTER — Other Ambulatory Visit: Payer: Self-pay | Admitting: Hematology and Oncology

## 2017-05-11 ENCOUNTER — Ambulatory Visit: Payer: Medicaid Other

## 2017-05-11 ENCOUNTER — Ambulatory Visit: Payer: Medicaid Other | Admitting: Physical Therapy

## 2017-05-11 VITALS — BP 130/93 | HR 70 | Temp 98.0°F | Resp 20 | Wt 111.0 lb

## 2017-05-11 DIAGNOSIS — I890 Lymphedema, not elsewhere classified: Secondary | ICD-10-CM

## 2017-05-11 DIAGNOSIS — M25612 Stiffness of left shoulder, not elsewhere classified: Secondary | ICD-10-CM | POA: Diagnosis present

## 2017-05-11 DIAGNOSIS — R471 Dysarthria and anarthria: Secondary | ICD-10-CM | POA: Diagnosis present

## 2017-05-11 DIAGNOSIS — C411 Malignant neoplasm of mandible: Secondary | ICD-10-CM

## 2017-05-11 DIAGNOSIS — M436 Torticollis: Secondary | ICD-10-CM

## 2017-05-11 DIAGNOSIS — R1310 Dysphagia, unspecified: Secondary | ICD-10-CM | POA: Diagnosis present

## 2017-05-11 DIAGNOSIS — C062 Malignant neoplasm of retromolar area: Secondary | ICD-10-CM

## 2017-05-11 DIAGNOSIS — L599 Disorder of the skin and subcutaneous tissue related to radiation, unspecified: Secondary | ICD-10-CM | POA: Diagnosis present

## 2017-05-11 DIAGNOSIS — R29898 Other symptoms and signs involving the musculoskeletal system: Secondary | ICD-10-CM | POA: Diagnosis present

## 2017-05-11 DIAGNOSIS — E876 Hypokalemia: Secondary | ICD-10-CM

## 2017-05-11 MED ORDER — POTASSIUM CHLORIDE ER 10 MEQ PO TBCR: 20.0000 meq | EXTENDED_RELEASE_TABLET | Freq: Two times a day (BID) | ORAL | 0 refills | Status: DC

## 2017-05-11 NOTE — Progress Notes (Signed)
Nutrition follow-up completed during head neck clinic.   Patient has started weekly carbotaxol for left retromolar trigone SCC. Current weight documented as 111 pounds which is decreased from 132 pounds November 26. Patient has a button PEG however he is not using it for nutrition. Reports he has to chew his food well and swallow on one side of his mouth.  This is frustrating for him and he often stops eating before he is full. Patient reports he can drink 2 bottles Ensure Plus 3 times daily without difficulty. He also tolerates soft foods as long as he does not have to chew too much.  Nutrition diagnosis: Unintended weight loss continues.  Estimated nutrition needs: 1700-1900 cal, 70-85 g protein, 1.9 L fluid.  Intervention: I encourage patient to continue soft foods as tolerated. Educated him to chop meats well in a gravy or sauce is for increased tolerance Provided patient with complimentary Ensure Plus Questions were answered teach back method used.  Monitoring evaluation goals: Patient will tolerate oral intake to increase weight and promote healing.  Next visit: To be scheduled with weekly chemotherapy.  **Disclaimer: This note was dictated with voice recognition software. Similar sounding words can inadvertently be transcribed and this note may contain transcription errors which may not have been corrected upon publication of note.**

## 2017-05-11 NOTE — Therapy (Signed)
[facility] [address] [place], [place], [address] Phone: [phone]   Fax:  [phone]  Speech Language Pathology Treatment  Patient Details  Name: Lawrence Arias MRN: [id number] Date of Birth: 07/05/1960 Referring Provider: Eppie Gibson, MD   Encounter Date: 05/11/2017  End of Session - 05/11/17 1145    Visit Number  2    Number of Visits  4    Date for SLP Re-Evaluation  07/30/17    Authorization Type  submitted for visits on 05-11-17    SLP Start Time  1039    SLP Stop Time   1113    SLP Time Calculation (min)  34 min    Activity Tolerance  Patient tolerated treatment well       Past Medical History:  Diagnosis Date  . Cancer ([place])   . GERD (gastroesophageal reflux disease)    uses OTC prn  . History of radiation therapy 12/31/16- 02/23/2017   Left oral cavity and regional nodes, 66 Gy in 33 fractions.   . Hypertension   . Tongue lesion     Past Surgical History:  Procedure Laterality Date  . DIRECT LARYNGOSCOPY N/A 07/24/2016   Procedure: DIRECT LARYNGOSCOPY;  Surgeon: Melissa Montane, MD;  Location: [facility];  Service: ENT;  Laterality: N/A;  Direct laryngoscopy with biopsy tongue lesion  . FRACTURE SURGERY     left wrist, remote  . FREE FLAP RADIAL FOREARM Left 10/16/2016   Dr. Hendricks [facility]  . free flap scapular Left 10/16/2016   Dr. Hendricks Limes - [facility] hospital  . GASTROSTOMY  10/27/2016   Dr. Daphene Calamity- [facility] hospital.   . IR FLUORO GUIDE PORT INSERTION RIGHT  12/22/2016  . IR US GUIDE VASC ACCESS RIGHT  12/22/2016  . NECK DISSECTION Bilateral 10/16/2016   Selective neck dissection, Dr. Hendricks Limes [facility]  . 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 Hendricks Limes [facility]  . SKIN GRAFT Left 10/16/2016   Split thickness skin graft, leg Dr. Hendricks Limes. [facility]  . TONGUE BIOPSY  07/24/2016   Procedure: TONGUE BIOPSY;  Surgeon: Melissa Montane,  MD;  Location: [facility];  Service: ENT;;  . TRACHEOSTOMY  10/16/2016   Dr. Hendricks Limes [facility]  . TRACHEOSTOMY TUBE PLACEMENT N/A 04/29/2017   Procedure: TRACHEOSTOMY REVISION;  Surgeon: Jodi Marble, MD;  Location: [facility];  Service: ENT;  Laterality: N/A;    There were no vitals filed for this visit.         ADULT SLP TREATMENT - 05/11/17 1137      General Information   Behavior/Cognition  Alert;Cooperative;Pleasant mood      Treatment Provided   Treatment provided  Dysphagia      Dysphagia Treatment   Temperature Spikes Noted  No    Respiratory Status  Trach    Oral Cavity - Dentition  Poor condition;Missing dentition    Treatment Methods  Skilled observation;Therapeutic exercise;Patient/caregiver education    Patient observed directly with PO's  Yes    Type of PO's observed  Dysphagia 1 (puree);Thin liquids    Oral Phase Signs & Symptoms  -- none    Pharyngeal Phase Signs & Symptoms  Wet vocal quality delayed - approx 30 seconds after liquids    Type of cueing  Visual;Verbal    Amount of cueing  -- usually    Other treatment/comments  Pt reported he had been completing his HEP, performed Masako independently. SLP  had to provide verbal/visual cues for other exercises. Pt told SLP why he is completing HEP with SBA, adn told SLP when he can reduce to x2/week with initial cues but independent by end of session.       Tracheostomy Shiley 4 mm Uncuffed   Tracheostomy Properties Placement Date: 04/29/17 Placement Time: 9509 Placed By: Other (Comment) Brand: Shiley Size (mm): 4 mm Style: Uncuffed Serial / Lot #: [id number] Expiration Date: 05/15/19   Status  Secured    Site Assessment  Clean;Dry    Site Care  -- pt has paper stuffed in trach to act as PMSV    Ties Assessment  Dry;Secure;Soiled      Assessment / Recommendations / Plan   Plan  Continue with current plan of care      Progression Toward Goals   Progression toward goals  Progressing toward goals        SLP Education - 05/11/17 1137    Education provided  Yes    Education Details  HEP procedure, (initially) when to reduce HEP to twice a week    Person(s) Educated  Patient    Methods  Explanation;Demonstration;Verbal cues    Comprehension  Verbalized understanding;Returned demonstration         SLP Long Term Goals - 05/11/17 1149      SLP LONG TERM GOAL #1   Title  pt will complete HEP with rare min A over two sessions    Baseline  total A; 05-11-17    Time  2    Period  -- sessions    Status  Partially Met      SLP LONG TERM GOAL #2   Title  pt will tell SLP why he is completing HEP with modified independence    Baseline  total A    Status  Achieved      SLP LONG TERM GOAL #3   Title  pt will tell SLP when HEP frequency can be reduced to x2-3/week    Baseline  total A    Status  Achieved       Plan - 05/11/17 1147    Clinical Impression Statement  Pt with oropharyngeal swallowing functional for dys I and thin (wet voice delayed approx 30 seconds after liquids), however pt without overt s/s aspiration PNA. The probability of swallowing difficulty increases with pt having undergone chemo and radiation therapy. Pt met 2 of 3 short term goals today, and partially met the third. Pt will need to cont to be followed by SLP for regular assessment of accurate HEP completion as well as for safety with POs both during and following treatment/s.    Speech Therapy Frequency  -- approx once every four weeks    Duration  -- 4 total visits    Treatment/Interventions  Aspiration precaution training;Pharyngeal strengthening exercises;Diet toleration management by SLP;Trials of upgraded texture/liquids;Internal/external aids;Patient/family education;Compensatory strategies;SLP instruction and feedback    Potential to Achieve Goals  Good    SLP Home Exercise Plan  provided today    Consulted and Agree with Plan of Care  Patient       Patient will benefit from skilled therapeutic  intervention in order to improve the following deficits and impairments:   Dysphagia, unspecified type  Dysarthria and anarthria    Problem List Patient Active Problem List   Diagnosis Date Noted  . Hypokalemia 05/07/2017  . Oral cancer ([place]) 08/25/2016  . 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, [place], [address] Phone: [phone]   Fax:  [phone]   Name: Lawrence Arias MRN: [id number] Date of Birth: 04-Aug-1960

## 2017-05-11 NOTE — Patient Instructions (Signed)
SWALLOWING EXERCISES Do these until July 4th, then 2 times per week afterwards  1. Effortful Swallows - Press your tongue against the roof of your mouth for 3 seconds, then squeeze          the muscles in your neck while you swallow your saliva or a sip of water - Repeat 20 times, 2-3 times a day, and use whenever you eat or drink  2. Masako Swallow - swallow with your tongue sticking out - Stick tongue out past your teeth and gently bite tongue with your teeth - Swallow, while holding your tongue with your teeth - Repeat 20 times, 2-3 times a day *use a wet spoon if your mouth gets dry*  3. Shaker Exercise - head lift - Lie flat on your back in your bed or on a couch without pillows - Raise your head and look at your feet - KEEP YOUR SHOULDERS DOWN - HOLD FOR 45-60 SECONDS, then lower your head back down - Repeat 3 times, 2-3 times a day  4. Mendelsohn Maneuver - "half swallow" exercise - Start to swallow, and keep your Adam's apple up by squeezing hard with the            muscles of the throat - Hold the squeeze for 5-7 seconds and then relax - Repeat 20 times, 2-3 times a day *use a wet spoon if your mouth gets dry*  5. Breath Hold - Say "HUH!" loudly, then hold your breath for 3 seconds at your voice box - Repeat 20 times, 2-3 times a day  6. Chin pushback - Open your mouth  - Place your fist UNDER your chin near your neck, and push back with your fist for 5 seconds - Repeat 10 times, 2-3 times a day

## 2017-05-11 NOTE — Therapy (Addendum)
[place], [place], [address] Phone: [phone]   Fax:  [phone]  Physical Therapy Evaluation  Patient Details  Name: Lawrence Arias MRN: [id number] Date of Birth: 1960/02/24 Referring Provider: Dr. Eppie Gibson   Encounter Date: 05/11/2017  PT End of Session - 05/11/17 1250    Visit Number  1    Number of Visits  4    Date for PT Re-Evaluation  06/11/17    Authorization Type  Medicaid submitted 05/11/17    PT Start Time  0954    PT Stop Time  1030    PT Time Calculation (min)  36 min    Activity Tolerance  Patient tolerated treatment well    Behavior During Therapy  [facility] for tasks assessed/performed       Past Medical History:  Diagnosis Date  . Cancer ([place])   . GERD (gastroesophageal reflux disease)    uses OTC prn  . History of radiation therapy 12/31/16- 02/23/2017   Left oral cavity and regional nodes, 66 Gy in 33 fractions.   . Hypertension   . Tongue lesion     Past Surgical History:  Procedure Laterality Date  . DIRECT LARYNGOSCOPY N/A 07/24/2016   Procedure: DIRECT LARYNGOSCOPY;  Surgeon: Melissa Montane, MD;  Location: [facility];  Service: ENT;  Laterality: N/A;  Direct laryngoscopy with biopsy tongue lesion  . FRACTURE SURGERY     left wrist, remote  . FREE FLAP RADIAL FOREARM Left 10/16/2016   Dr. Hendricks Limes -[facility]  . free flap scapular Left 10/16/2016   Dr. Hendricks Limes - [facility]  . GASTROSTOMY  10/27/2016   Dr. Daphene Calamity- [facility] hospital.   . IR FLUORO GUIDE PORT INSERTION RIGHT  12/22/2016  . IR US GUIDE VASC ACCESS RIGHT  12/22/2016  . NECK DISSECTION Bilateral 10/16/2016   Selective neck dissection, Dr. Hendricks Limes [facility]  . 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 Hendricks Limes [facility]  . SKIN GRAFT Left 10/16/2016   Split thickness skin graft, leg Dr. Hendricks Limes. [facility]  . TONGUE BIOPSY  07/24/2016   Procedure: TONGUE BIOPSY;  Surgeon: Melissa Montane, MD;  Location: [facility];  Service: ENT;;  . TRACHEOSTOMY  10/16/2016   Dr. Hendricks Limes [facility]  . TRACHEOSTOMY TUBE PLACEMENT N/A 04/29/2017   Procedure: TRACHEOSTOMY REVISION;  Surgeon: Jodi Marble, MD;  Location: [facility];  Service: ENT;  Laterality: N/A;    There were no vitals filed for this visit.   Subjective Assessment - 05/11/17 1000    Subjective  I have good days and bad days.  Today's a bad one.  I'm living where I don't want to be. Doing okay physically.    Pertinent History  Diagnosis of left retromolar trigone squamous cell carcinoma.  Had surgery at [facility] 10/16/16 that he says included lymph node dissection; has trach in place.  Had XRT 02/02/17-02/23/17; did not have chemo then due to non-compliance with oncologist's appointments.  Restaging CT indicated recurrence, so started weekly vchemo 04/30/17.    Patient Stated Goals  follow-up with all head & neck clinic providers    Currently in Pain?  Yes    Pain Score  8     Pain Location  Neck and face    Pain Orientation  Left    Pain Descriptors / Indicators  Burning    Aggravating Factors   getting worse as time passes  Pain Relieving Factors  sleeping well, but having trouble sleeping currently         [facility] PT Assessment - 05/11/17 0001      Assessment   Medical Diagnosis  left retromolar trigone squamous cell carcinoma    Referring Provider  Dr. Eppie Gibson    Onset Date/Surgical Date  10/16/16 tumor resection and neck dissection; trach and PEG placed    Hand Dominance  Right      Precautions   Precautions  Other (comment)    Precaution Comments  cancer precautions      Restrictions   Weight Bearing Restrictions  No      Balance Screen   Has the patient fallen in the past 6 months  No    Has the patient had a decrease in activity level because of a fear of falling?   No    Is the patient reluctant to leave their home because of a fear of falling?    No      Home Environment   Living Environment  Private residence    [place] his son    Type of Home  Apartment    Home Layout  Two level    Alternate Level Stairs-Rails  Left going up      Prior Function   Level of Independence  Independent with basic ADLs;Independent with community mobility without device    Leisure  walking now, but not much; "I ain't got no energy, no wind."      Cognition   Overall Cognitive Status  Within Functional Limits for tasks assessed      Observation/Other Assessments   Skin Integrity  wound up under left jaw where skin fold is      Functional Tests   Functional tests  Sit to Stand      Sit to Stand   Comments  11 times in 30 seconds breathing was okay for this      Posture/Postural Control   Posture/Postural Control  Postural limitations    Postural Limitations  Forward head      AROM   Overall AROM Comments  left shoulder flexion to approx. 110 degrees, abduction 90    Cervical Flexion  limited by mass, apparently    Cervical Extension  to neutral only     Cervical - Right Side Bend  50% loss    Cervical - Left Side Bend  50% loss    Cervical - Right Rotation  50% loss    Cervical - Left Rotation  50% loss      Ambulation/Gait   Ambulation/Gait Assistance  7: Independent        LYMPHEDEMA/ONCOLOGY QUESTIONNAIRE - 05/11/17 1010      Head and Neck   Left Corner of mouth to where ear lobe meets face  11.8 cm    Other  approx. 2 cm. superior to trach collar: 37.2 cm.    Other  mid chin to tragus on left = 15. on right 14.5               No data recorded  Objective measurements completed on examination: See above findings.      [place] Adult PT Treatment/Exercise - 05/11/17 0001      Self-Care   Self-Care  Other Self-Care Comments    Other Self-Care Comments   Issue foam chip pack and placed it around his head with pack at left cheek/jaw area of fullness.  Instructed patient in its use.  PT Education - 05/11/17 1249    Education provided  Yes    Education Details  to use foam chip pack with gentle pressure on left cheek (NOT on neck at this time due to wound there) for 2-4 hours/day and to note any benefit    Person(s) Educated  Patient    Methods  Explanation;Verbal cues;Demonstration    Comprehension  Verbalized understanding       PT Short Term Goals - 05/11/17 1303      PT SHORT TERM GOAL #1   Title  Pt. will be independent in self-manual lymph drainage.    Time  3    Period  Weeks    Status  New      PT SHORT TERM GOAL #2   Title  Pt. will be knowledgeable about where and how to obtain a neck compression garment, as well as appropriate use of this garment.    Time  3    Period  Weeks    Status  New      PT SHORT TERM GOAL #3   Title  Pt. will be independent in initial neck and shoulder home exercise program.    Time  3    Period  Weeks    Status  New        PT Long Term Goals - 05/11/17 1301      PT LONG TERM GOAL #1   Time  4    Period  Weeks    Status  New         Head and Neck Clinic Goals - 11/24/16 1047      Patient will be able to verbalize understanding of a home exercise program for cervical range of motion, posture, and walking.    Status  Achieved      Patient will be able to verbalize understanding of proper sitting and standing posture.    Status  Achieved      Patient will be able to verbalize understanding of lymphedema risk and availability of treatment for this condition.    Status  Achieved         Plan - 05/11/17 1250    Clinical Impression Statement  This is a gentleman who expresses some anger and frustration today, feeling like his requests have been ignored by medical personnel. He has obvious left facial swelling.  Left neck is quite indurated and there is a skin fold there; a wound was revealed when skin folds were separated.  Pt. reports it has been oozing at night but he  didn't realize he had a wound. He also has neck tightness and some left shoulder ROM limitations.    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 Check neck wound. Begin manual lymph drainage and instructing patient in same. Assist with obtaining a manufactured head &  neck compression garment. Neck P/AA/A/ROM and manual work such as myofascial release. Left shoulder ROM and strengthening exercises.    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 Hypokalemia 05/07/2017  . Oral cancer ([place]) 08/25/2016  . 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 has passed away. He did not return for treatment as planned after this initial evaluation.   Remaining deficits: N/A   Education / Equipment: N/A Plan: Patient agrees to discharge.  Patient goals were not met. Patient is being discharged due to not returning since the last visit.  He did not return for follow-up as planned and subsequently passed away.  ?????  Serafina Royals, PT 07/26/17 4:37 PM

## 2017-05-12 NOTE — Progress Notes (Signed)
Oncology Nurse Navigator Documentation  Met with Lawrence Arias upon his arrival for [facility] [place].  He was unaccompanied.  Provided verbal and written overview of [facility], the clinicians who will be seeing him, encouraged him to ask questions during his time with them.  He was seen by Nutrition, SLP, PT, SW and [place].  Spoke with him at end of [facility], addressed questions.    He voiced understanding Nutritionist Dory Peru is leaving 3 cases of nutritional supplement at lobby desk for him to pick up upon return home.  Facilitated visit with Dr. Lebron Conners following [place] to address L neck fistula and potassium Rx. I encouraged him to call me with needs/concerns.  [name], RN, BSN Head & Neck Oncology [place] at [place] [phone]

## 2017-05-13 ENCOUNTER — Encounter: Payer: Self-pay | Admitting: Hematology and Oncology

## 2017-05-13 NOTE — Progress Notes (Signed)
[name] Follow-up Visit:  Assessment: Squamous cell cancer of retromolar trigone ([place]) 57 y.o.  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  Patient was supposed to be on systemic chemotherapy, but did not follow-up with any of the scheduled appointments in the beginning of the treatment necessitating discontinuation of the systemic therapy plan.  Later in the course of radiation, patient returned requesting initiation of systemic therapy, but my opinion, the benefit was minimal at that point and I did not proceed with any systemic treatment considering patient had less than a week left of radiation at that time.  Restaging imaging of neck and chest is suggestive of possible disease recurrence. Patient presents to the clinic today with complaints of oral and throat pain.  Examination confirms progressive swelling and likely progressive malignancy in the neck based on evaluation and palpation.  Presently, he is potential follow-up at [facility] and scheduled on 05/12/17 which is likely too far removed due to visibly progressive malignancy.  Patient was initiated on palliative systemic therapy with carboplatin and paclitaxel 1 week ago.  Tolerated the initial infusion without complications.  No significant difficulties sofar.  Plan: -Continue OxyContin/oxycodone combination for pain control. -Return to my clinic in 2 weeks for continued toxicity monitoring and possible administration of the second cycle of systemic palliative chemotherapy. -Next scheduled disease assessment will occur after 3 cycles of systemic chemotherapy with CT of the neck and chest.  Voice recognition software was used and creation of this note. Despite my best effort at editing the text, some misspelling/errors may have occurred.  Orders Placed This Encounter  Procedures  . CBC with Differential (Cancer Center Only)     Standing Status:   Future    Standing Expiration Date:   05/08/2018  . CMP ([place] only)    Standing Status:   Future    Standing Expiration Date:   05/08/2018  . Magnesium    Standing Status:   Future    Standing Expiration Date:   05/07/2018  . Phosphorus    Standing Status:   Future    Standing Expiration Date:   05/07/2018    Cancer Staging Oral cancer [facility]) Staging form: Oral Cavity, AJCC 8th Edition - Clinical: Stage III (cT3, cN1, cM0) - Signed by Eppie Gibson, MD on 08/25/2016  Squamous cell cancer of retromolar trigone ([place]) Staging form: Oral Cavity, AJCC 8th Edition - Pathologic stage from 10/16/2016: Stage IVB (pT2, pN3b, cM0) - Unsigned   All questions were answered.  . The patient knows to call the clinic with any problems, questions or concerns.  This note was electronically signed.    History of Presenting Illness Lawrence Arias is a 57 y.o. followed in the [facility] for diagnosis of squamous cell carcinoma of the left retromolar trigone. Since the diagnosis in Jul 2018, patient underwent surgery on 10/16/16 including graft tissue placement. Recovered well from the operation, and he is referred to medical oncology for possible systemic chemotherapy as an adjunctive for the adjuvant radiation therapy. His primary ENT is Dr. Janace Hoard and Radiation Oncologist is Dr Isidore Moos.  Due to concerns for residual/recurrent disease in the surgical bed, patient was recommended to undergo chemoradiotherapy and was planned for the treatment in mid October 2018.  Nevertheless, he did not show up for several appointments with our clinic and eventually we made decision to forego chemotherapy.  Patient is nearing completion of his radiation therapy and tolerated  it quite well so far.   Patient returns to the clinic for toxicity monitoring while receiving systemic palliative systemic therapy with carboplatin and paclitaxel.  In the interim, patient reports feeling somewhat tired  for a couple of days after the chemotherapy, but then returning to his baseline.  He reports slight improvement in the breathing symptoms, but denies any persistent nausea, no vomiting, no peripheral neuropathy and no interval fevers or chills.  Oncological/hematological History:   Squamous cell cancer of retromolar trigone ([place])   07/24/2016 Initial Diagnosis    Squamous cell cancer of retromolar trigone [facility]): Initial biopsy demonstrating moderately differentiated invasive squamous cell carcinoma.       08/14/2016 Imaging    CT neck: Nonvisualization of left retromolar trigone mucosal lesion. No evidence of invasion into the adjacent structures. 2. Left level IIa lymph node measuring 1 cm, likely indicating spread of disease. No contralateral adenopathy.      09/29/2016 Imaging    CT Neck [facility]): Left-sided pathological lymphadenopathy of metastatic disease at level IB, IIA. Possible involvement of level IA, level III, and level IV on the left side.      10/16/2016 Surgery    Dr Harriett Rush Sunny Schlein [place]  [facility]): LEFT TONSIL, SOFT PALATE, FLOOR OF MOUTH, BASE OF TONGUE, AND MANDIBLE, PARTIAL GLOSSOPHARYNGECTOMY AND PARTIAL MANDIBULECTOMY, LEFT PARAPHARYNGEAL MASS, EXCISION, TEETH, REMOVAL       10/16/2016 Pathology Results    Surgical Pathology:  LEFT TONSIL, SOFT PALATE, FLOOR OF MOUTH, BASE OF TONGUE, AND MANDIBLE, PARTIAL GLOSSOPHARYNGECTOMY AND PARTIAL MANDIBULECTOMY: Invasive squamous cell carcinoma, keratinizing type, moderately differentiated, is identified forming a 3.7x1.8x1.0cm mass in the left retromolar area.Tumor invades to a depth of 0.8 cm.Lymphovascular invasion is identified. Perineural invasion is identified. The medial soft tissue margin is positive for invasive squamous cell carcinoma (focal). LEFT PARAPHARYNGEAL MASS, EXCISION: Invasive squamous cell carcinoma forming a 1.2x1.2x0.5cm mass. Invasive carcinoma involves the unoriented soft tissue margin over a  linear length of 0.3 cm. TEETH, REMOVAL: Two teeth (gross examination only).  Margins: "TUMOR NEXT TO BONE", EXCISION: Scant fragments of bone and dense fibrous tissue, negative for carcinoma. "PHARYNGEAL WALL MARGIN", EXCISION: Squamous mucosa with acute inflammation and skeletal muscle, negative for carcinoma. "HARD PALATE MARGIN", EXCISION: Squamous mucosa, negative for carcinoma. "TONGUE BASE MARGIN", EXCISION: Squamous mucosa with chronic inflammation, negative for carcinoma. "INFERIOR BUCCAL MUCOSA MARGIN", EXCISION: Squamous mucosa and skeletal muscle, negative for carcinoma. "SUPERIOR BUCCAL MUCOSAL MARGIN", EXCISION: Squamous mucosa with chronic inflammation, negative for carcinoma.  Lymph nodes:  LEVEL 1A NECK CONTENTS, LYMPH NODE DISSECTION: Three lymph nodes, negative for metastatic carcinoma (0/3). LEFT LEVEL 1B NECK CONTENTS, LYMPH NODE DISSECTION: Metastatic carcinoma in two of five lymph nodes (2/5). Largest metastatic deposit measures 2.4 cm. Definitive extranodal extension is not identified. Salivary gland parenchyma, negative for tumor. LEFT LEVEL 2B NECK CONTENTS, LYMPH NODE DISSECTION: Eight lymph nodes, negative for metastatic carcinoma (0/8). LEFT LEVEL 2A, 3, 4, AND 5 NECK CONTENTS, LYMPH NODE DISSECTION: Metastatic carcinoma in one of twenty-two lymph nodes (1/22). Largest metastatic deposit measures 3.5 cm. Extranodal extension is present and measures 1.5 mm beyond the lymph node capsule.  COMMENT: Immunohistochemical staining for p16 (Block A4) shows the invasive carcinoma is diffusely positive for p16.The positive control stains appropriately.      11/04/2016 Imaging    CT Neck [facility]): No pathological lymphadenopathy. Surgical changes noted. Significant fluid collection in the left upper neck with internal gas and significant soft tissue swelling.      11/30/2016 Imaging  CT Neck: Postop resection of retromolar trigone carcinoma on the left with free flap.  Postsurgical changes left neck without evidence of recurrent tumor.   CT Chest: No suspicious pulmonary nodules or masses identified. No thoracic adenopathy.      12/31/2016 - 02/23/2017 Radiation Therapy    Left oral cavity and regional nodes: --66Gy/33 fx        03/25/2017 Relapse/Recurrence    CT Neck & Chest: Likely recurrent disease in the neck with left submandibular mass measuring 3.0 cm.  Multiple new small pulmonary nodules worrisome for possible metastatic disease.      04/30/2017 -  Chemotherapy    Palliative systemic chemotherapy: Carboplatin AUC 6, d1 + Paclitaxel '225mg'$ /m2, d1 Q21d --Cycle #1, 04/30/17:        Medical History: Past Medical History:  Diagnosis Date  . Cancer ([place])   . GERD (gastroesophageal reflux disease)    uses OTC prn  . History of radiation therapy 12/31/16- 02/23/2017   Left oral cavity and regional nodes, 66 Gy in 33 fractions.   . Hypertension   . Tongue lesion     Surgical History: Past Surgical History:  Procedure Laterality Date  . DIRECT LARYNGOSCOPY N/A 07/24/2016   Procedure: DIRECT LARYNGOSCOPY;  Surgeon: Melissa Montane, MD;  Location: [place];  Service: ENT;  Laterality: N/A;  Direct laryngoscopy with biopsy tongue lesion  . FRACTURE SURGERY     left wrist, remote  . FREE FLAP RADIAL FOREARM Left 10/16/2016   Dr. Hendricks Limes -[facility]  . free flap scapular Left 10/16/2016   Dr. Hendricks Limes - [facility] hospital  . GASTROSTOMY  10/27/2016   Dr. Daphene Calamity- [facility].   . IR FLUORO GUIDE PORT INSERTION RIGHT  12/22/2016  . IR US GUIDE VASC ACCESS RIGHT  12/22/2016  . NECK DISSECTION Bilateral 10/16/2016   Selective neck dissection, Dr. Hendricks Limes [facility]  . 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 Hendricks Limes [facility]  . SKIN GRAFT Left 10/16/2016   Split thickness skin graft, leg Dr. Hendricks Limes. [facility]  . TONGUE BIOPSY  07/24/2016   Procedure: TONGUE BIOPSY;  Surgeon: Melissa Montane,  MD;  Location: [place];  Service: ENT;;  . TRACHEOSTOMY  10/16/2016   Dr. Hendricks Limes [facility]  . TRACHEOSTOMY TUBE PLACEMENT N/A 04/29/2017   Procedure: TRACHEOSTOMY REVISION;  Surgeon: Jodi Marble, MD;  Location: [facility] OR;  Service: ENT;  Laterality: N/A;    Family History: Family History  Problem Relation Age of Onset  . Hypertension Mother   . Hypertension Father     Social History: Social History   Socioeconomic History  . Marital status: Single    Spouse name: Not on file  . Number of children: 3  . Years of education: Not on file  . Highest education level: Not on file  Occupational History  . Not on file  Social Needs  . Financial resource strain: Not on file  . Food insecurity:    Worry: Not on file    Inability: Not on file  . Transportation needs:    Medical: Not on file    Non-medical: Not on file  Tobacco Use  . Smoking status: Current Some Day Smoker    Packs/day: 0.50    Types: Cigarettes  . Smokeless tobacco: Never Used  . Tobacco comment: he is smoking 0ne every other day (11/20/16)  Substance and Sexual Activity  . Alcohol use: Not on file  . Drug  use: No  . Sexual activity: Not on file  Lifestyle  . Physical activity:    Days per week: Not on file    Minutes per session: Not on file  . Stress: Not on file  Relationships  . Social connections:    Talks on phone: Not on file    Gets together: Not on file    Attends religious service: Not on file    Active member of club or organization: Not on file    Attends meetings of clubs or organizations: Not on file    Relationship status: Not on file  . Intimate partner violence:    Fear of current or ex partner: Not on file    Emotionally abused: Not on file    Physically abused: Not on file    Forced sexual activity: Not on file  Other Topics Concern  . Not on file  Social History Narrative  . Not on file    Allergies: No Known Allergies  Medications:  Current Outpatient  Medications  Medication Sig Dispense Refill  . acetaminophen (TYLENOL) 500 MG tablet Take 1,000 mg by mouth every 6 (six) hours as needed for moderate pain.    Marland Kitchen amLODipine (NORVASC) 5 MG tablet Take 5 mg by mouth daily.  0  . buPROPion (WELLBUTRIN SR) 150 MG 12 hr tablet Start one week before quit date. Take 1 tab daily x 3 days, then 1 tab BID thereafter. 60 tablet 2  . gabapentin (NEURONTIN) 300 MG capsule Take 1 capsule (300 mg total) by mouth 3 (three) times daily. To address cancer related pain. (Patient not taking: Reported on 04/29/2017) 90 capsule 0  . lidocaine (XYLOCAINE) 2 % solution Use as directed 20 mLs in the mouth or throat as needed for mouth pain. 100 mL 0  . nicotine (NICODERM CQ - DOSED IN MG/24 HOURS) 14 mg/24hr patch Place 1 patch (14 mg total) onto the skin daily. Apply 21 mg patch daily x 6 wk, then '14mg'$  patch daily x 2 wk, then 7 mg patch daily x 2 wk 14 patch 0  . nicotine (NICODERM CQ - DOSED IN MG/24 HR) 7 mg/24hr patch Place 1 patch (7 mg total) onto the skin daily. Apply 21 mg patch daily x 6 wk, then '14mg'$  patch daily x 2 wk, then 7 mg patch daily x 2 wk (Patient not taking: Reported on 04/29/2017) 14 patch 0  . oxyCODONE (OXYCONTIN) 10 mg 12 hr tablet Take 1 tablet (10 mg total) by mouth every 12 (twelve) hours. 60 tablet 0  . polyethylene glycol powder (MIRALAX) powder Use as directed for constipation. Follow directions on container. (Patient not taking: Reported on 03/19/2017) 850 g 3  . potassium chloride (K-DUR) 10 MEQ tablet Take 2 tablets (20 mEq total) by mouth 2 (two) times daily for 14 days. 56 tablet 0   No current facility-administered medications for this visit.    Facility-Administered Medications Ordered in Other Visits  Medication Dose Route Frequency Provider Last Rate Last Dose  . alteplase (CATHFLO ACTIVASE) injection 2 mg  2 mg Intracatheter Once PRN Lashuna Tamashiro, Marinell Blight, MD      . heparin lock flush 100 unit/mL  250 Units Intracatheter PRN Afifa Truax,  Deshonda Cryderman G, MD      . heparin lock flush 100 unit/mL  500 Units Intracatheter PRN Elmar Antigua, Marinell Blight, MD      . sodium chloride flush (NS) 0.9 % injection 10 mL  10 mL Intracatheter PRN Lebron Conners, Marinell Blight, MD  Review of Systems: Review of Systems  Constitutional: Positive for fatigue.  HENT:   Positive for lump/mass, sore throat and trouble swallowing.   Respiratory: Positive for shortness of breath. Negative for hemoptysis.   All other systems reviewed and are negative.    PHYSICAL EXAMINATION Blood pressure 112/89, pulse (!) 126, temperature 98.1 F (36.7 C), temperature source Oral, resp. rate (!) 24, height '5\' 7"'$  (1.702 m), weight 111 lb 3.2 oz (50.4 kg), SpO2 99 %.  ECOG PERFORMANCE STATUS: 2 - Symptomatic, <50% confined to bed  Physical Exam  Constitutional: He is oriented to person, place, and time and well-developed, well-nourished, and in no distress. No distress.  HENT:  Mouth/Throat: No oropharyngeal exudate.  Progression of left facial and cervical swelling with progressive palpable masses in the left neck including several dermal lesions including one with ulceration.  Tracheostomy in place with thick nonpurulent secretions.  Eyes: Pupils are equal, round, and reactive to light. EOM are normal. No scleral icterus.  Neck: Normal range of motion. Neck supple. No thyromegaly present.  Cardiovascular: Normal rate, regular rhythm and intact distal pulses.  No murmur heard. Pulmonary/Chest: Effort normal and breath sounds normal. No respiratory distress. He has no wheezes. He has no rales.  Abdominal: Soft. Bowel sounds are normal. He exhibits no distension and no mass. There is no tenderness. There is no rebound and no guarding.  Musculoskeletal: Normal range of motion. He exhibits no edema.  Lymphadenopathy:    He has no cervical adenopathy.  Neurological: He is alert and oriented to person, place, and time. He has normal reflexes. No cranial nerve deficit.  Coordination normal.  Skin: Skin is dry. No rash noted. He is not diaphoretic. No erythema. No pallor.     LABORATORY DATA: I have personally reviewed the data as listed: Appointment on 05/07/2017  Component Date Value Ref Range Status  . Magnesium 05/07/2017 1.3* 1.5 - 2.5 mg/dL Final   Comment: Performed at [facility], [place] [address] [place], [place] [address] CRITICAL RESULT CALLED TO, READ BACK BY AND VERIFIED WITH: DAVIS,NATALIE    . Sodium 05/07/2017 127* 136 - 145 mmol/L Final  . Potassium 05/07/2017 2.7* 3.5 - 5.1 mmol/L Final   CRITICAL RESULT CALLED TO, READ BACK BY AND VERIFIED WITH: DAVIS,NATALIE   . Chloride 05/07/2017 83* 98 - 109 mmol/L Final  . CO2 05/07/2017 30* 22 - 29 mmol/L Final  . Glucose, Bld 05/07/2017 114  70 - 140 mg/dL Final  . BUN 05/07/2017 7  7 - 26 mg/dL Final  . Creatinine 05/07/2017 0.94  0.70 - 1.30 mg/dL Final  . Calcium 05/07/2017 10.3  8.4 - 10.4 mg/dL Final  . Total Protein 05/07/2017 8.3  6.4 - 8.3 g/dL Final  . Albumin 05/07/2017 3.6  3.5 - 5.0 g/dL Final  . AST 05/07/2017 22  5 - 34 U/L Final  . ALT 05/07/2017 20  0 - 55 U/L Final  . Alkaline Phosphatase 05/07/2017 75  40 - 150 U/L Final  . Total Bilirubin 05/07/2017 0.6  0.2 - 1.2 mg/dL Final  . GFR, Est Non Af Am 05/07/2017 >60  >60 mL/min Final  . GFR, Est AFR Am 05/07/2017 >60  >60 mL/min Final   Comment: (NOTE) The eGFR has been calculated using the CKD EPI equation. This calculation has not been validated in all clinical situations. eGFR's persistently <60 mL/min signify possible Chronic Kidney Disease.   . Anion gap 05/07/2017 14* 3 - 11 Final   Performed  at [facility], [place] [address] [place], [place] [address]  . WBC Count 05/07/2017 0.3* 4.0 - 10.3 K/uL Final   CRITICAL RESULT CALLED TO, READ BACK BY AND VERIFIED WITH: Paula Compton at 1506.RB    . RBC 05/07/2017 3.02* 4.20 - 5.82 MIL/uL Final  . Hemoglobin 05/07/2017  10.6* 13.0 - 17.1 g/dL Final  . HCT 05/07/2017 30.4* 38.4 - 49.9 % Final  . MCV 05/07/2017 100.8* 79.3 - 98.0 fL Final  . MCH 05/07/2017 35.1* 27.2 - 33.4 pg Final  . MCHC 05/07/2017 34.8  32.0 - 36.0 g/dL Final  . RDW 05/07/2017 15.3* 11.0 - 14.6 % Final  . Platelet Count 05/07/2017 283  140 - 400 K/uL Final  . Neutrophils Relative % 05/07/2017 20  % Final  . Lymphocytes Relative 05/07/2017 49  % Final  . Monocytes Relative 05/07/2017 17  % Final  . Eosinophils Relative 05/07/2017 4  % Final  . Basophils Relative 05/07/2017 10  % Final  . Band Neutrophils 05/07/2017 0  % Final  . Metamyelocytes Relative 05/07/2017 0  % Final  . Myelocytes 05/07/2017 0  % Final  . Promyelocytes Absolute 05/07/2017 0  % Final  . Blasts 05/07/2017 0  % Final  . nRBC 05/07/2017 0  0 /100 WBC Final  . Other 05/07/2017 0  % Final  . Neutro Abs 05/07/2017 0.1* 1.5 - 6.5 K/uL Final   CRITICAL RESULT CALLED TO, READ BACK BY AND VERIFIED WITH: NATALIE   . Lymphs Abs 05/07/2017 0.1* 0.9 - 3.3 K/uL Final  . Monocytes Absolute 05/07/2017 0.1  0.1 - 0.9 K/uL Final  . Eosinophils Absolute 05/07/2017 0.0  0.0 - 0.5 K/uL Final  . Basophils Absolute 05/07/2017 0.0  0.0 - 0.1 K/uL Final   Performed at [facility], [place] [address] [place], [place] [address]       Ardath Sax, MD

## 2017-05-13 NOTE — Assessment & Plan Note (Signed)
57 y.o.  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  Patient was supposed to be on systemic chemotherapy, but did not follow-up with any of the scheduled appointments in the beginning of the treatment necessitating discontinuation of the systemic therapy plan.  Later in the course of radiation, patient returned requesting initiation of systemic therapy, but my opinion, the benefit was minimal at that point and I did not proceed with any systemic treatment considering patient had less than a week left of radiation at that time.  Restaging imaging of neck and chest is suggestive of possible disease recurrence. Patient presents to the clinic today with complaints of oral and throat pain.  Examination confirms progressive swelling and likely progressive malignancy in the neck based on evaluation and palpation.  Presently, he is potential follow-up at [facility] and scheduled on 05/12/17 which is likely too far removed due to visibly progressive malignancy.  Patient was initiated on palliative systemic therapy with carboplatin and paclitaxel 1 week ago.  Tolerated the initial infusion without complications.  No significant difficulties sofar.  Plan: -Continue OxyContin/oxycodone combination for pain control. -Return to my clinic in 2 weeks for continued toxicity monitoring and possible administration of the second cycle of systemic palliative chemotherapy. -Next scheduled disease assessment will occur after 3 cycles of systemic chemotherapy with CT of the neck and chest.

## 2017-05-14 ENCOUNTER — Inpatient Hospital Stay: Payer: Medicaid Other

## 2017-05-14 ENCOUNTER — Encounter: Payer: Self-pay | Admitting: *Deleted

## 2017-05-14 ENCOUNTER — Encounter: Payer: Self-pay | Admitting: Hematology and Oncology

## 2017-05-14 ENCOUNTER — Telehealth: Payer: Self-pay | Admitting: Hematology and Oncology

## 2017-05-14 ENCOUNTER — Inpatient Hospital Stay (HOSPITAL_BASED_OUTPATIENT_CLINIC_OR_DEPARTMENT_OTHER): Payer: Medicaid Other | Admitting: Hematology and Oncology

## 2017-05-14 VITALS — BP 102/76 | HR 103 | Temp 98.6°F | Resp 20 | Ht 67.0 in | Wt 115.2 lb

## 2017-05-14 DIAGNOSIS — G893 Neoplasm related pain (acute) (chronic): Secondary | ICD-10-CM

## 2017-05-14 DIAGNOSIS — Z930 Tracheostomy status: Secondary | ICD-10-CM

## 2017-05-14 DIAGNOSIS — C062 Malignant neoplasm of retromolar area: Secondary | ICD-10-CM

## 2017-05-14 DIAGNOSIS — F17210 Nicotine dependence, cigarettes, uncomplicated: Secondary | ICD-10-CM | POA: Diagnosis not present

## 2017-05-14 DIAGNOSIS — R070 Pain in throat: Secondary | ICD-10-CM | POA: Diagnosis not present

## 2017-05-14 DIAGNOSIS — Z79899 Other long term (current) drug therapy: Secondary | ICD-10-CM | POA: Diagnosis not present

## 2017-05-14 DIAGNOSIS — E876 Hypokalemia: Secondary | ICD-10-CM | POA: Diagnosis not present

## 2017-05-14 DIAGNOSIS — Z5111 Encounter for antineoplastic chemotherapy: Secondary | ICD-10-CM | POA: Diagnosis not present

## 2017-05-14 DIAGNOSIS — Z923 Personal history of irradiation: Secondary | ICD-10-CM | POA: Diagnosis not present

## 2017-05-14 DIAGNOSIS — R530 Neoplastic (malignant) related fatigue: Secondary | ICD-10-CM

## 2017-05-14 LAB — CBC WITH DIFFERENTIAL (CANCER CENTER ONLY)
Basophils Absolute: 0 10*3/uL (ref 0.0–0.1)
Basophils Relative: 1 %
Eosinophils Absolute: 0 10*3/uL (ref 0.0–0.5)
Eosinophils Relative: 1 %
HCT: 29.5 % — ABNORMAL LOW (ref 38.4–49.9)
Hemoglobin: 10.4 g/dL — ABNORMAL LOW (ref 13.0–17.1)
LYMPHS ABS: 0.3 10*3/uL — AB (ref 0.9–3.3)
LYMPHS PCT: 8 %
MCH: 35.2 pg — AB (ref 27.2–33.4)
MCHC: 35.1 g/dL (ref 32.0–36.0)
MCV: 100.4 fL — AB (ref 79.3–98.0)
MONO ABS: 0.8 10*3/uL (ref 0.1–0.9)
MONOS PCT: 18 %
Neutro Abs: 3.1 10*3/uL (ref 1.5–6.5)
Neutrophils Relative %: 72 %
PLATELETS: 445 10*3/uL — AB (ref 140–400)
RBC: 2.94 MIL/uL — AB (ref 4.20–5.82)
RDW: 14.9 % — ABNORMAL HIGH (ref 11.0–14.6)
WBC: 4.2 10*3/uL (ref 4.0–10.3)

## 2017-05-14 LAB — CMP (CANCER CENTER ONLY)
ALBUMIN: 3.1 g/dL — AB (ref 3.5–5.0)
ALT: 23 U/L (ref 0–55)
AST: 20 U/L (ref 5–34)
Alkaline Phosphatase: 86 U/L (ref 40–150)
Anion gap: 9 (ref 3–11)
BUN: 5 mg/dL — AB (ref 7–26)
CHLORIDE: 87 mmol/L — AB (ref 98–109)
CO2: 31 mmol/L — AB (ref 22–29)
Calcium: 9.8 mg/dL (ref 8.4–10.4)
Creatinine: 0.7 mg/dL (ref 0.70–1.30)
GFR, Est AFR Am: >60 mL/min (ref >60)
GFR, Estimated: >60 mL/min (ref >60)
GLUCOSE: 105 mg/dL (ref 70–140)
POTASSIUM: 3.2 mmol/L — AB (ref 3.5–5.1)
SODIUM: 127 mmol/L — AB (ref 136–145)
Total Bilirubin: <0.2 mg/dL — ABNORMAL LOW (ref 0.2–1.2)
Total Protein: 7.5 g/dL (ref 6.4–8.3)

## 2017-05-14 LAB — PHOSPHORUS: PHOSPHORUS: 3.4 mg/dL (ref 2.5–4.6)

## 2017-05-14 LAB — MAGNESIUM: MAGNESIUM: 1.1 mg/dL — AB (ref 1.5–2.5)

## 2017-05-14 MED ORDER — MAGNESIUM CITRATE PO SOLN: 1.0000 | Freq: Every day | ORAL | 0 refills | Status: DC

## 2017-05-14 NOTE — Telephone Encounter (Signed)
Called and spoke with patient regarding appointment Date/Time per 3/29 los

## 2017-05-15 NOTE — Progress Notes (Addendum)
Oncology Nurse Navigator Documentation  To provide support, encouragement and care continuity, met with [name] during Est Pt appt with Dr. Lebron Conners.  He was unaccompanied. He reported:  Kept Wed appt this week with Dr. Donzetta Kohut, [facility].  He indicated he is returning next week for removal of G-tube since eating/drinking 100% orally.  He further noted past difficulty using low-profile tube, currently not flushing b/c does not have connector.    Drinking 6 bottles Ensure daily, confirmed he picked up 3 cases provided by Dory Peru, RD.  Compliance with HEP per instruction by SLP Garald Balding, next exercises per instruction by PT Serafina Royals.  I reinforced Dr. Clydene Laming encouragement to continue.  Margrett Rud, case manager with [facility], is ensuring he has trach supplies, is looking for alternative living arrangements.  (Pt has previously reported living with son is extremely stressful/difficult. He voiced understanding next carboplatin/carbotaxal infusion to be scheduled next week. I encouraged him to call me with questions/concerns.  [name], RN, BSN Head & Neck Oncology Nurse [place] [facility] [phone]

## 2017-05-21 ENCOUNTER — Other Ambulatory Visit: Payer: Self-pay | Admitting: Hematology and Oncology

## 2017-05-21 ENCOUNTER — Telehealth: Payer: Self-pay | Admitting: Hematology and Oncology

## 2017-05-21 ENCOUNTER — Inpatient Hospital Stay: Payer: Medicaid Other | Attending: Hematology and Oncology

## 2017-05-21 VITALS — BP 123/98 | HR 97 | Temp 97.8°F | Resp 16

## 2017-05-21 DIAGNOSIS — Z79899 Other long term (current) drug therapy: Secondary | ICD-10-CM | POA: Diagnosis not present

## 2017-05-21 DIAGNOSIS — R530 Neoplastic (malignant) related fatigue: Secondary | ICD-10-CM | POA: Diagnosis not present

## 2017-05-21 DIAGNOSIS — R0602 Shortness of breath: Secondary | ICD-10-CM | POA: Insufficient documentation

## 2017-05-21 DIAGNOSIS — Z923 Personal history of irradiation: Secondary | ICD-10-CM | POA: Diagnosis not present

## 2017-05-21 DIAGNOSIS — Z8249 Family history of ischemic heart disease and other diseases of the circulatory system: Secondary | ICD-10-CM | POA: Insufficient documentation

## 2017-05-21 DIAGNOSIS — F17210 Nicotine dependence, cigarettes, uncomplicated: Secondary | ICD-10-CM | POA: Diagnosis not present

## 2017-05-21 DIAGNOSIS — E871 Hypo-osmolality and hyponatremia: Secondary | ICD-10-CM | POA: Insufficient documentation

## 2017-05-21 DIAGNOSIS — C062 Malignant neoplasm of retromolar area: Secondary | ICD-10-CM

## 2017-05-21 DIAGNOSIS — Z5111 Encounter for antineoplastic chemotherapy: Secondary | ICD-10-CM | POA: Diagnosis present

## 2017-05-21 DIAGNOSIS — G893 Neoplasm related pain (acute) (chronic): Secondary | ICD-10-CM | POA: Insufficient documentation

## 2017-05-21 DIAGNOSIS — R070 Pain in throat: Secondary | ICD-10-CM | POA: Insufficient documentation

## 2017-05-21 MED ORDER — MAGNESIUM OXIDE 400 (241.3 MG) MG PO TABS: 400.0000 mg | ORAL_TABLET | Freq: Two times a day (BID) | ORAL | 0 refills | Status: AC

## 2017-05-21 MED ORDER — SODIUM CHLORIDE 0.9 % IV SOLN
Freq: Once | INTRAVENOUS | Status: AC
  Administered 2017-05-21: 11:00:00 via INTRAVENOUS
  Filled 2017-05-21: qty 5

## 2017-05-21 MED ORDER — PROCHLORPERAZINE MALEATE 10 MG PO TABS: 10.0000 mg | ORAL_TABLET | Freq: Four times a day (QID) | ORAL | 0 refills | Status: DC | PRN

## 2017-05-21 MED ORDER — SODIUM CHLORIDE 0.9 % IV SOLN
600.0000 mg | Freq: Once | INTRAVENOUS | Status: AC
  Administered 2017-05-21: 600 mg via INTRAVENOUS
  Filled 2017-05-21: qty 60

## 2017-05-21 MED ORDER — OXYCODONE HCL ER 10 MG PO T12A: 10.0000 mg | EXTENDED_RELEASE_TABLET | Freq: Two times a day (BID) | ORAL | 0 refills | Status: DC

## 2017-05-21 MED ORDER — DIPHENHYDRAMINE HCL 50 MG/ML IJ SOLN
INTRAMUSCULAR | Status: AC
  Filled 2017-05-21: qty 1

## 2017-05-21 MED ORDER — FAMOTIDINE IN NACL 20-0.9 MG/50ML-% IV SOLN
INTRAVENOUS | Status: AC
  Filled 2017-05-21: qty 50

## 2017-05-21 MED ORDER — MAGNESIUM CITRATE PO SOLN: 1.0000 | Freq: Every day | ORAL | 0 refills | Status: DC

## 2017-05-21 MED ORDER — DIPHENHYDRAMINE HCL 50 MG/ML IJ SOLN
50.0000 mg | Freq: Once | INTRAMUSCULAR | Status: AC
  Administered 2017-05-21: 50 mg via INTRAVENOUS

## 2017-05-21 MED ORDER — HEPARIN SOD (PORK) LOCK FLUSH 100 UNIT/ML IV SOLN
500.0000 [IU] | Freq: Once | INTRAVENOUS | Status: AC | PRN
  Administered 2017-05-21: 500 [IU]
  Filled 2017-05-21: qty 5

## 2017-05-21 MED ORDER — SODIUM CHLORIDE 0.9% FLUSH
10.0000 mL | INTRAVENOUS | Status: DC | PRN
  Administered 2017-05-21: 10 mL
  Filled 2017-05-21: qty 10

## 2017-05-21 MED ORDER — PALONOSETRON HCL INJECTION 0.25 MG/5ML
INTRAVENOUS | Status: AC
  Filled 2017-05-21: qty 5

## 2017-05-21 MED ORDER — PALONOSETRON HCL INJECTION 0.25 MG/5ML
0.2500 mg | Freq: Once | INTRAVENOUS | Status: AC
  Administered 2017-05-21: 0.25 mg via INTRAVENOUS

## 2017-05-21 MED ORDER — ONDANSETRON HCL 8 MG PO TABS: 8.0000 mg | ORAL_TABLET | Freq: Three times a day (TID) | ORAL | 0 refills | Status: AC | PRN

## 2017-05-21 MED ORDER — SODIUM CHLORIDE 0.9 % IV SOLN
Freq: Once | INTRAVENOUS | Status: AC
  Administered 2017-05-21: 09:00:00 via INTRAVENOUS

## 2017-05-21 MED ORDER — PACLITAXEL CHEMO INJECTION 300 MG/50ML
225.0000 mg/m2 | Freq: Once | INTRAVENOUS | Status: AC
  Administered 2017-05-21: 354 mg via INTRAVENOUS
  Filled 2017-05-21: qty 59

## 2017-05-21 MED ORDER — FAMOTIDINE IN NACL 20-0.9 MG/50ML-% IV SOLN
20.0000 mg | Freq: Once | INTRAVENOUS | Status: AC
  Administered 2017-05-21: 20 mg via INTRAVENOUS

## 2017-05-21 MED ORDER — SODIUM CHLORIDE 0.9 % IV SOLN: 20.0000 mg | Freq: Once | INTRAVENOUS | Status: DC

## 2017-05-21 NOTE — Telephone Encounter (Signed)
I was asked by nursing staff to clarify a few things.  I spoke with Dr. Lebron Conners and the pharmacist. #1. Dr. Lebron Conners confirmed the dosing of the chemotherapy would be carboplatin AUC of 6 and Taxol at 225 mg/m.  The orders are signed and confirmed at this dose #2. I was asked to refill his prescription OxyContin.  I have refilled for 7 days prescription #3. I was asked to write prescription refill for magnesium citrate #4. I was notified that antiemetics is not seen on the medication profile and I have provided a prescription for Compazine and Zofran as needed #5. Magnesium level is noted to be low.  I have given prescription magnesium sulfate twice a day to the patient.

## 2017-05-21 NOTE — Patient Instructions (Signed)
[facility] °Discharge Instructions for Patients Receiving Chemotherapy ° °Today you received the following chemotherapy agents Taxol; Carboplatin ° °To help prevent nausea and vomiting after your treatment, we encourage you to take your nausea medication as directed °  °If you develop nausea and vomiting that is not controlled by your nausea medication, call the clinic.  ° °BELOW ARE SYMPTOMS THAT SHOULD BE REPORTED IMMEDIATELY: °· *FEVER GREATER THAN 100.5 F °· *CHILLS WITH OR WITHOUT FEVER °· NAUSEA AND VOMITING THAT IS NOT CONTROLLED WITH YOUR NAUSEA MEDICATION °· *UNUSUAL SHORTNESS OF BREATH °· *UNUSUAL BRUISING OR BLEEDING °· TENDERNESS IN MOUTH AND THROAT WITH OR WITHOUT PRESENCE OF ULCERS °· *URINARY PROBLEMS °· *BOWEL PROBLEMS °· UNUSUAL RASH °Items with * indicate a potential emergency and should be followed up as soon as possible. ° °Feel free to call the clinic should you have any questions or concerns. The clinic phone number is [phone]. ° °Please show the CHEMO ALERT CARD at check-in to the Emergency Department and triage nurse. ° ° °

## 2017-05-24 NOTE — Progress Notes (Signed)
[facility] Follow-up Visit:  Assessment: Squamous cell cancer of retromolar trigone ([place]) 57 y.o.  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  Patient was supposed to be on systemic Arias, Lawrence Arias of radiation, patient returned requesting initiation of systemic therapy, Lawrence my opinion, the benefit was minimal at that point and I did not proceed with any systemic treatment considering patient had less than a week left of radiation at that time.  Restaging imaging of neck and chest is suggestive of possible disease recurrence. Patient presents to the clinic today with complaints of oral and throat pain.  Examination confirms progressive swelling and likely progressive malignancy in the neck based on evaluation and palpation.  Presently, he is potential follow-up at [facility] and scheduled on 05/12/17 which is likely too far removed due to visibly progressive malignancy.  Patient is currently undergoing palliative-intent systemic Arias with carboplatin and paclitaxel administered every 3 weeks.  Appears to be tolerating Arias reasonably well despite the high dose of treatment administered.  At this time, I believe that which was administered as high dose as patient can tolerate in order to provide the best chance of successful therapy to the patient.  Plan: -Continue OxyContin/oxycodone combination for pain control. -Proceed with systemic Arias on 05/21/17 at the doses of the first cycle.. -Return to my clinic in 2 weeks with labs for continued symptom management. -Cycle #3 of systemic Arias in 4 weeks. -Next scheduled disease assessment will occur after 3 cycles of systemic Arias with CT of  the neck and chest.  Voice recognition software was used and creation of this note. Despite my best effort at editing the text, some misspelling/errors may have occurred.  Orders Placed This Encounter  Procedures  . CBC with Differential (Cancer Center Only)    Standing Status:   Future    Standing Expiration Date:   05/15/2018  . CMP ([place] only)    Standing Status:   Future    Standing Expiration Date:   05/15/2018  . Magnesium    Standing Status:   Future    Standing Expiration Date:   05/14/2018  . Phosphorus    Standing Status:   Future    Standing Expiration Date:   05/14/2018    Cancer Staging Oral cancer [facility]) Staging form: Oral Cavity, AJCC 8th Edition - Clinical: Stage III (cT3, cN1, cM0) - Signed by Eppie Gibson, MD on 08/25/2016  Squamous cell cancer of retromolar trigone ([place]) Staging form: Oral Cavity, AJCC 8th Edition - Pathologic stage from 10/16/2016: Stage IVB (pT2, pN3b, cM0) - Unsigned   All questions were answered.  . The patient knows to call the clinic with any problems, questions or concerns.  This note was electronically signed.    History of Presenting Illness Lawrence Arias is a 57 y.o. followed in the [place] for diagnosis of squamous cell carcinoma of the left retromolar trigone. Since the diagnosis in Jul 2018, patient underwent surgery on 10/16/16 including graft tissue placement. Recovered well from the operation, and he is referred to medical oncology for possible systemic Arias as an adjunctive for the adjuvant radiation therapy. His primary ENT is Dr. Janace Hoard and Radiation Oncologist is Dr Isidore Moos.  Due to concerns for residual/recurrent disease in the surgical bed, patient was  recommended to undergo chemoradiotherapy and was planned for the treatment in mid October 2018.  Nevertheless, he did not show up for several appointments with our clinic and eventually we made decision to forego Arias.  Patient is nearing  completion of his radiation therapy and tolerated it quite well so far.   Patient returns to the clinic for toxicity monitoring while receiving systemic palliative systemic therapy with carboplatin and paclitaxel.  Patient denies any fevers, chills, night sweats.  No breakthrough uncontrolled nausea.  Pain is currently well controlled.  Main problem that the patient is describing is leaking and burning around the PEG tube.  Oncological/hematological History:   Squamous cell cancer of retromolar trigone ([place])   07/24/2016 Initial Diagnosis    Squamous cell cancer of retromolar trigone [facility]): Initial biopsy demonstrating moderately differentiated invasive squamous cell carcinoma.       08/14/2016 Imaging    CT neck: Nonvisualization of left retromolar trigone mucosal lesion. No evidence of invasion into the adjacent structures. 2. Left level IIa lymph node measuring 1 cm, likely indicating spread of disease. No contralateral adenopathy.      09/29/2016 Imaging    CT Neck [facility]): Left-sided pathological lymphadenopathy of metastatic disease at level IB, IIA. Possible involvement of level IA, level III, and level IV on the left side.      10/16/2016 Surgery    Dr Harriett Rush Sunny [place]  [facility]): LEFT TONSIL, SOFT PALATE, FLOOR OF MOUTH, BASE OF TONGUE, AND MANDIBLE, PARTIAL GLOSSOPHARYNGECTOMY AND PARTIAL MANDIBULECTOMY, LEFT PARAPHARYNGEAL MASS, EXCISION, TEETH, REMOVAL       10/16/2016 Pathology Results    Surgical Pathology:  LEFT TONSIL, SOFT PALATE, FLOOR OF MOUTH, BASE OF TONGUE, AND MANDIBLE, PARTIAL GLOSSOPHARYNGECTOMY AND PARTIAL MANDIBULECTOMY: Invasive squamous cell carcinoma, keratinizing type, moderately differentiated, is identified forming a 3.7x1.8x1.0cm mass in the left retromolar area.Tumor invades to a depth of 0.8 cm.Lymphovascular invasion is identified. Perineural invasion is identified. The medial soft tissue margin is positive for invasive squamous cell  carcinoma (focal). LEFT PARAPHARYNGEAL MASS, EXCISION: Invasive squamous cell carcinoma forming a 1.2x1.2x0.5cm mass. Invasive carcinoma involves the unoriented soft tissue margin over a linear length of 0.3 cm. TEETH, REMOVAL: Two teeth (gross examination only).  Margins: "TUMOR NEXT TO BONE", EXCISION: Scant fragments of bone and dense fibrous tissue, negative for carcinoma. "PHARYNGEAL WALL MARGIN", EXCISION: Squamous mucosa with acute inflammation and skeletal muscle, negative for carcinoma. "HARD PALATE MARGIN", EXCISION: Squamous mucosa, negative for carcinoma. "TONGUE BASE MARGIN", EXCISION: Squamous mucosa with chronic inflammation, negative for carcinoma. "INFERIOR BUCCAL MUCOSA MARGIN", EXCISION: Squamous mucosa and skeletal muscle, negative for carcinoma. "SUPERIOR BUCCAL MUCOSAL MARGIN", EXCISION: Squamous mucosa with chronic inflammation, negative for carcinoma.  Lymph nodes:  LEVEL 1A NECK CONTENTS, LYMPH NODE DISSECTION: Three lymph nodes, negative for metastatic carcinoma (0/3). LEFT LEVEL 1B NECK CONTENTS, LYMPH NODE DISSECTION: Metastatic carcinoma in two of five lymph nodes (2/5). Largest metastatic deposit measures 2.4 cm. Definitive extranodal extension is not identified. Salivary gland parenchyma, negative for tumor. LEFT LEVEL 2B NECK CONTENTS, LYMPH NODE DISSECTION: Eight lymph nodes, negative for metastatic carcinoma (0/8). LEFT LEVEL 2A, 3, 4, AND 5 NECK CONTENTS, LYMPH NODE DISSECTION: Metastatic carcinoma in one of twenty-two lymph nodes (1/22). Largest metastatic deposit measures 3.5 cm. Extranodal extension is present and measures 1.5 mm beyond the lymph node capsule.  COMMENT: Immunohistochemical staining for p16 (Block A4) shows the invasive carcinoma is diffusely positive for p16.The positive control stains appropriately.      11/04/2016 Imaging  CT Neck [facility]): No pathological lymphadenopathy. Surgical changes noted. Significant fluid collection in the left  upper neck with internal gas and significant soft tissue swelling.      11/30/2016 Imaging    CT Neck: Postop resection of retromolar trigone carcinoma on the left with free flap. Postsurgical changes left neck without evidence of recurrent tumor.   CT Chest: No suspicious pulmonary nodules or masses identified. No thoracic adenopathy.      12/31/2016 - 02/23/2017 Radiation Therapy    Left oral cavity and regional nodes: --66Gy/33 fx        03/25/2017 Relapse/Recurrence    CT Neck & Chest: Likely recurrent disease in the neck with left submandibular mass measuring 3.0 cm.  Multiple new small pulmonary nodules worrisome for possible metastatic disease.      04/30/2017 -  Arias    Palliative systemic Arias: Carboplatin AUC 6, d1 + Paclitaxel '225mg'$ /m2, d1 Q21d --Cycle #1, 04/30/17:        Medical History: Past Medical History:  Diagnosis Date  . Cancer ([place])   . GERD (gastroesophageal reflux disease)    uses OTC prn  . History of radiation therapy 12/31/16- 02/23/2017   Left oral cavity and regional nodes, 66 Gy in 33 fractions.   . Hypertension   . Tongue lesion     Surgical History: Past Surgical History:  Procedure Laterality Date  . DIRECT LARYNGOSCOPY N/A 07/24/2016   Procedure: DIRECT LARYNGOSCOPY;  Surgeon: Melissa Montane, MD;  Location: [place];  Service: ENT;  Laterality: N/A;  Direct laryngoscopy with biopsy tongue lesion  . FRACTURE SURGERY     left wrist, remote  . FREE FLAP RADIAL FOREARM Left 10/16/2016   Dr. Hendricks Limes -[facility]  . free flap scapular Left 10/16/2016   Dr. Hendricks Limes - [facility] hospital  . GASTROSTOMY  10/27/2016   Dr. Daphene Calamity- [facility] hospital.   . IR FLUORO GUIDE PORT INSERTION RIGHT  12/22/2016  . IR US GUIDE VASC ACCESS RIGHT  12/22/2016  . NECK DISSECTION Bilateral 10/16/2016   Selective neck dissection, Dr. Hendricks Limes [facility]  . removal oral cancer Left 10/16/2016   resection of oral  cancer, Dr. Hendricks Limes [facility]  . SKIN GRAFT Left 10/16/2016   Split thickness skin graft, leg Dr. Hendricks Limes. [facility]  . TONGUE BIOPSY  07/24/2016   Procedure: TONGUE BIOPSY;  Surgeon: Melissa Montane, MD;  Location: [place];  Service: ENT;;  . TRACHEOSTOMY  10/16/2016   Dr. Hendricks Limes [facility]  . TRACHEOSTOMY TUBE PLACEMENT N/A 04/29/2017   Procedure: TRACHEOSTOMY REVISION;  Surgeon: Jodi Marble, MD;  Location: [facility] OR;  Service: ENT;  Laterality: N/A;    Family History: Family History  Problem Relation Age of Onset  . Hypertension Mother   . Hypertension Father     Social History: Social History   Socioeconomic History  . Marital status: Single    Spouse name: Not on file  . Number of children: 3  . Years of education: Not on file  . Highest education level: Not on file  Occupational History  . Not on file  Social Needs  . Financial resource strain: Not on file  . Food insecurity:    Worry: Not on file    Inability: Not on file  . Transportation needs:    Medical: Not on file    Non-medical: Not on file  Tobacco Use  . Smoking status: Current Some Day Smoker    Packs/day: 0.50  Types: Cigarettes  . Smokeless tobacco: Never Used  . Tobacco comment: he is smoking 0ne every other day (11/20/16)  Substance and Sexual Activity  . Alcohol use: Not on file  . Drug use: No  . Sexual activity: Not on file  Lifestyle  . Physical activity:    Days per week: Not on file    Minutes per session: Not on file  . Stress: Not on file  Relationships  . Social connections:    Talks on phone: Not on file    Gets together: Not on file    Attends religious service: Not on file    Active member of club or organization: Not on file    Attends meetings of clubs or organizations: Not on file    Relationship status: Not on file  . Intimate partner violence:    Fear of current or ex partner: Not on file    Emotionally abused: Not on file    Physically abused: Not on file    Forced sexual  activity: Not on file  Other Topics Concern  . Not on file  Social History Narrative  . Not on file    Allergies: No Known Allergies  Medications:  Current Outpatient Medications  Medication Sig Dispense Refill  . acetaminophen (TYLENOL) 500 MG tablet Take 1,000 mg by mouth every 6 (six) hours as needed for moderate pain.    Marland Kitchen amLODipine (NORVASC) 5 MG tablet Take 5 mg by mouth daily.  0  . buPROPion (WELLBUTRIN SR) 150 MG 12 hr tablet Start one week before quit date. Take 1 tab daily x 3 days, then 1 tab BID thereafter. 60 tablet 2  . gabapentin (NEURONTIN) 300 MG capsule Take 1 capsule (300 mg total) by mouth 3 (three) times daily. To address cancer related pain. 90 capsule 0  . lidocaine (XYLOCAINE) 2 % solution Use as directed 20 mLs in the mouth or throat as needed for mouth pain. 100 mL 0  . nicotine (NICODERM CQ - DOSED IN MG/24 HOURS) 14 mg/24hr patch Place 1 patch (14 mg total) onto the skin daily. Apply 21 mg patch daily x 6 wk, then '14mg'$  patch daily x 2 wk, then 7 mg patch daily x 2 wk 14 patch 0  . nicotine (NICODERM CQ - DOSED IN MG/24 HR) 7 mg/24hr patch Place 1 patch (7 mg total) onto the skin daily. Apply 21 mg patch daily x 6 wk, then '14mg'$  patch daily x 2 wk, then 7 mg patch daily x 2 wk 14 patch 0  . polyethylene glycol powder (MIRALAX) powder Use as directed for constipation. Follow directions on container. 850 g 3  . potassium chloride (K-DUR) 10 MEQ tablet Take 2 tablets (20 mEq total) by mouth 2 (two) times daily for 14 days. 56 tablet 0  . magnesium citrate SOLN Take 296 mLs (1 Bottle total) by mouth daily for 7 days. 7 Bottle 0  . magnesium oxide (MAG-OX) 400 (241.3 Mg) MG tablet Take 1 tablet (400 mg total) by mouth 2 (two) times daily. 60 tablet 0  . ondansetron (ZOFRAN) 8 MG tablet Take 1 tablet (8 mg total) by mouth every 8 (eight) hours as needed for nausea. 30 tablet 0  . oxyCODONE (OXYCONTIN) 10 mg 12 hr tablet Take 1 tablet (10 mg total) by mouth every 12  (twelve) hours for 7 days. 14 tablet 0  . prochlorperazine (COMPAZINE) 10 MG tablet Take 1 tablet (10 mg total) by mouth every 6 (six) hours as  needed for nausea or vomiting. 30 tablet 0   No current facility-administered medications for this visit.    Facility-Administered Medications Ordered in Other Visits  Medication Dose Route Frequency Provider Last Rate Last Dose  . alteplase (CATHFLO ACTIVASE) injection 2 mg  2 mg Intracatheter Once PRN Jeriann Sayres, Marinell Blight, MD      . heparin lock flush 100 unit/mL  250 Units Intracatheter PRN Jd Mccaster G, MD      . heparin lock flush 100 unit/mL  500 Units Intracatheter PRN Brendan Gadson, Marinell Blight, MD      . sodium chloride flush (NS) 0.9 % injection 10 mL  10 mL Intracatheter PRN Lebron Conners, Marinell Blight, MD        Review of Systems: Review of Systems  Constitutional: Positive for fatigue.  HENT:   Positive for lump/mass, sore throat and trouble swallowing.   Respiratory: Positive for shortness of breath. Negative for hemoptysis.   All other systems reviewed and are negative.    PHYSICAL EXAMINATION Blood pressure 102/76, pulse (!) 103, temperature 98.6 F (37 C), temperature source Oral, resp. rate 20, height '5\' 7"'$  (1.702 m), weight 115 lb 3.2 oz (52.3 kg), SpO2 100 %.  ECOG PERFORMANCE STATUS: 2 - Symptomatic, <50% confined to bed  Physical Exam  Constitutional: He is oriented to person, place, and time and well-developed, well-nourished, and in no distress. No distress.  HENT:  Mouth/Throat: No oropharyngeal exudate.  Stable left facial and cervical swelling with progressive palpable masses in the left neck including several dermal lesions including one with ulceration.  Tracheostomy in place with thick nonpurulent secretions.  Eyes: Pupils are equal, round, and reactive to light. EOM are normal. No scleral icterus.  Neck: Normal range of motion. Neck supple. No thyromegaly present.  Cardiovascular: Normal rate, regular rhythm and intact  distal pulses.  No murmur heard. Pulmonary/Chest: Effort normal and breath sounds normal. No respiratory distress. He has no wheezes. He has no rales.  Abdominal: Soft. Bowel sounds are normal. He exhibits no distension and no mass. There is no tenderness. There is no rebound and no guarding.  Musculoskeletal: Normal range of motion. He exhibits no edema.  Lymphadenopathy:    He has no cervical adenopathy.  Neurological: He is alert and oriented to person, place, and time. He has normal reflexes. No cranial nerve deficit. Coordination normal.  Skin: Skin is dry. No rash noted. He is not diaphoretic. No erythema. No pallor.     LABORATORY DATA: I have personally reviewed the data as listed: Appointment on 05/14/2017  Component Date Value Ref Range Status  . Phosphorus 05/14/2017 3.4  2.5 - 4.6 mg/dL Final   Performed at [facility] [address] [place], [place] [address]  . Magnesium 05/14/2017 1.1* 1.5 - 2.5 mg/dL Final   Comment: Performed at [facility], [place] [address], [place], [place] [address] CRITICAL RESULT CALLED TO, READ BACK BY AND VERIFIED WITH: Ardyth Harps at [place] by Prudencio Burly     . Sodium 05/14/2017 127* 136 - 145 mmol/L Final  . Potassium 05/14/2017 3.2* 3.5 - 5.1 mmol/L Final  . Chloride 05/14/2017 87* 98 - 109 mmol/L Final  . CO2 05/14/2017 31* 22 - 29 mmol/L Final  . Glucose, Bld 05/14/2017 105  70 - 140 mg/dL Final  . BUN 05/14/2017 5* 7 - 26 mg/dL Final  . Creatinine 05/14/2017 0.70  0.70 - 1.30 mg/dL Final  . Calcium 05/14/2017 9.8  8.4 - 10.4 mg/dL Final  .  Total Protein 05/14/2017 7.5  6.4 - 8.3 g/dL Final  . Albumin 05/14/2017 3.1* 3.5 - 5.0 g/dL Final  . AST 05/14/2017 20  5 - 34 U/L Final  . ALT 05/14/2017 23  0 - 55 U/L Final  . Alkaline Phosphatase 05/14/2017 86  40 - 150 U/L Final  . Total Bilirubin 05/14/2017 <0.2* 0.2 - 1.2 mg/dL Final  . GFR, Est Non Af Am 05/14/2017 >60  >60 mL/min Final  . GFR,  Est AFR Am 05/14/2017 >60  >60 mL/min Final   Comment: (NOTE) The eGFR has been calculated using the CKD EPI equation. This calculation has not been validated in all clinical situations. eGFR's persistently <60 mL/min signify possible Chronic Kidney Disease.   Georgiann Hahn gap 05/14/2017 9  3 - 11 Final   Performed at [facility], [place] [address], [place], [place] [address]  . WBC Count 05/14/2017 4.2  4.0 - 10.3 K/uL Final  . RBC 05/14/2017 2.94* 4.20 - 5.82 MIL/uL Final  . Hemoglobin 05/14/2017 10.4* 13.0 - 17.1 g/dL Final  . HCT 05/14/2017 29.5* 38.4 - 49.9 % Final  . MCV 05/14/2017 100.4* 79.3 - 98.0 fL Final  . MCH 05/14/2017 35.2* 27.2 - 33.4 pg Final  . MCHC 05/14/2017 35.1  32.0 - 36.0 g/dL Final  . RDW 05/14/2017 14.9* 11.0 - 14.6 % Final  . Platelet Count 05/14/2017 445* 140 - 400 K/uL Final  . Neutrophils Relative % 05/14/2017 72  % Final  . Neutro Abs 05/14/2017 3.1  1.5 - 6.5 K/uL Final  . Lymphocytes Relative 05/14/2017 8  % Final  . Lymphs Abs 05/14/2017 0.3* 0.9 - 3.3 K/uL Final  . Monocytes Relative 05/14/2017 18  % Final  . Monocytes Absolute 05/14/2017 0.8  0.1 - 0.9 K/uL Final  . Eosinophils Relative 05/14/2017 1  % Final  . Eosinophils Absolute 05/14/2017 0.0  0.0 - 0.5 K/uL Final  . Basophils Relative 05/14/2017 1  % Final  . Basophils Absolute 05/14/2017 0.0  0.0 - 0.1 K/uL Final   Performed at [facility], [place] [address] [place], [place] [address]       Ardath Sax, MD

## 2017-05-24 NOTE — Assessment & Plan Note (Signed)
57 y.o.  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  Patient was supposed to be on systemic chemotherapy, but did not follow-up with any of the scheduled appointments in the beginning of the treatment necessitating discontinuation of the systemic therapy plan.  Later in the course of radiation, patient returned requesting initiation of systemic therapy, but my opinion, the benefit was minimal at that point and I did not proceed with any systemic treatment considering patient had less than a week left of radiation at that time.  Restaging imaging of neck and chest is suggestive of possible disease recurrence. Patient presents to the clinic today with complaints of oral and throat pain.  Examination confirms progressive swelling and likely progressive malignancy in the neck based on evaluation and palpation.  Presently, he is potential follow-up at [facility] and scheduled on 05/12/17 which is likely too far removed due to visibly progressive malignancy.  Patient is currently undergoing palliative-intent systemic chemotherapy with carboplatin and paclitaxel administered every 3 weeks.  Appears to be tolerating chemotherapy reasonably well despite the high dose of treatment administered.  At this time, I believe that which was administered as high dose as patient can tolerate in order to provide the best chance of successful therapy to the patient.  Plan: -Continue OxyContin/oxycodone combination for pain control. -Proceed with systemic chemotherapy on 05/21/17 at the doses of the first cycle.. -Return to my clinic in 2 weeks with labs for continued symptom management. -Cycle #3 of systemic chemotherapy in 4 weeks. -Next scheduled disease assessment will occur after 3 cycles of systemic chemotherapy with CT of the neck and chest.

## 2017-05-28 ENCOUNTER — Inpatient Hospital Stay: Payer: Medicaid Other

## 2017-05-28 ENCOUNTER — Telehealth: Payer: Self-pay | Admitting: Hematology and Oncology

## 2017-05-28 ENCOUNTER — Inpatient Hospital Stay (HOSPITAL_BASED_OUTPATIENT_CLINIC_OR_DEPARTMENT_OTHER): Payer: Medicaid Other | Admitting: Hematology and Oncology

## 2017-05-28 VITALS — BP 134/96 | HR 118 | Temp 97.6°F | Resp 18 | Ht 67.0 in | Wt 104.2 lb

## 2017-05-28 DIAGNOSIS — G893 Neoplasm related pain (acute) (chronic): Secondary | ICD-10-CM | POA: Diagnosis not present

## 2017-05-28 DIAGNOSIS — Z923 Personal history of irradiation: Secondary | ICD-10-CM | POA: Diagnosis not present

## 2017-05-28 DIAGNOSIS — E871 Hypo-osmolality and hyponatremia: Secondary | ICD-10-CM

## 2017-05-28 DIAGNOSIS — Z8249 Family history of ischemic heart disease and other diseases of the circulatory system: Secondary | ICD-10-CM

## 2017-05-28 DIAGNOSIS — Z79899 Other long term (current) drug therapy: Secondary | ICD-10-CM

## 2017-05-28 DIAGNOSIS — R530 Neoplastic (malignant) related fatigue: Secondary | ICD-10-CM | POA: Diagnosis not present

## 2017-05-28 DIAGNOSIS — R0602 Shortness of breath: Secondary | ICD-10-CM | POA: Diagnosis not present

## 2017-05-28 DIAGNOSIS — C062 Malignant neoplasm of retromolar area: Secondary | ICD-10-CM

## 2017-05-28 DIAGNOSIS — R070 Pain in throat: Secondary | ICD-10-CM | POA: Diagnosis not present

## 2017-05-28 DIAGNOSIS — F17210 Nicotine dependence, cigarettes, uncomplicated: Secondary | ICD-10-CM | POA: Diagnosis not present

## 2017-05-28 LAB — CBC WITH DIFFERENTIAL (CANCER CENTER ONLY)
Basophils Absolute: 0.1 10*3/uL (ref 0.0–0.1)
Basophils Relative: 3 %
EOS PCT: 1 %
Eosinophils Absolute: 0 10*3/uL (ref 0.0–0.5)
HEMATOCRIT: 31 % — AB (ref 38.4–49.9)
Hemoglobin: 11 g/dL — ABNORMAL LOW (ref 13.0–17.1)
LYMPHS PCT: 10 %
Lymphs Abs: 0.2 10*3/uL — ABNORMAL LOW (ref 0.9–3.3)
MCH: 34.7 pg — AB (ref 27.2–33.4)
MCHC: 35.5 g/dL (ref 32.0–36.0)
MCV: 97.8 fL (ref 79.3–98.0)
MONO ABS: 0.2 10*3/uL (ref 0.1–0.9)
MONOS PCT: 7 %
NEUTROS ABS: 1.6 10*3/uL (ref 1.5–6.5)
Neutrophils Relative %: 79 %
Platelet Count: 277 10*3/uL (ref 140–400)
RBC: 3.17 MIL/uL — ABNORMAL LOW (ref 4.20–5.82)
RDW: 13.2 % (ref 11.0–14.6)
WBC Count: 2.1 10*3/uL — ABNORMAL LOW (ref 4.0–10.3)

## 2017-05-28 LAB — CMP (CANCER CENTER ONLY)
ALBUMIN: 3.7 g/dL (ref 3.5–5.0)
ALK PHOS: 103 U/L (ref 40–150)
ALT: 25 U/L (ref 0–55)
ANION GAP: 12 — AB (ref 3–11)
AST: 25 U/L (ref 5–34)
BUN: 7 mg/dL (ref 7–26)
CALCIUM: 10.6 mg/dL — AB (ref 8.4–10.4)
CO2: 28 mmol/L (ref 22–29)
Chloride: 85 mmol/L — ABNORMAL LOW (ref 98–109)
Creatinine: 0.84 mg/dL (ref 0.70–1.30)
GFR, Est AFR Am: >60 mL/min (ref >60)
GFR, Estimated: >60 mL/min (ref >60)
GLUCOSE: 117 mg/dL (ref 70–140)
POTASSIUM: 4 mmol/L (ref 3.5–5.1)
SODIUM: 125 mmol/L — AB (ref 136–145)
Total Bilirubin: 0.4 mg/dL (ref 0.2–1.2)
Total Protein: 8.5 g/dL — ABNORMAL HIGH (ref 6.4–8.3)

## 2017-05-28 LAB — PHOSPHORUS: Phosphorus: 4.4 mg/dL (ref 2.5–4.6)

## 2017-05-28 LAB — MAGNESIUM: Magnesium: 1.2 mg/dL — CL (ref 1.5–2.5)

## 2017-05-28 MED ORDER — MAGNESIUM CITRATE PO SOLN: 1.0000 | Freq: Two times a day (BID) | ORAL | 3 refills | Status: AC

## 2017-05-28 MED ORDER — MAGNESIUM CITRATE PO SOLN: 1.0000 | Freq: Two times a day (BID) | ORAL | 3 refills | Status: DC

## 2017-05-28 MED ORDER — OXYCODONE HCL ER 10 MG PO T12A: 10.0000 mg | EXTENDED_RELEASE_TABLET | Freq: Two times a day (BID) | ORAL | 0 refills | Status: DC

## 2017-05-28 MED ORDER — OXYCODONE HCL 10 MG PO TABS: 5.0000 mg | ORAL_TABLET | ORAL | 0 refills | Status: DC | PRN

## 2017-05-28 NOTE — Telephone Encounter (Signed)
Scheduled appt per 4/12 los - per Dr. Lebron Conners okay to sch for 4/25 instead of 4/26 - gave patient aVS and calender per los.

## 2017-06-10 ENCOUNTER — Telehealth: Payer: Self-pay

## 2017-06-10 ENCOUNTER — Inpatient Hospital Stay: Payer: Medicaid Other

## 2017-06-10 ENCOUNTER — Inpatient Hospital Stay (HOSPITAL_COMMUNITY)
Admission: EM | Admit: 2017-06-10 | Discharge: 2017-06-14 | DRG: 641 | Disposition: A | Discharge status: Home Health | Payer: Medicaid Other | Attending: Internal Medicine | Admitting: Internal Medicine

## 2017-06-10 ENCOUNTER — Other Ambulatory Visit: Payer: Self-pay

## 2017-06-10 ENCOUNTER — Encounter: Payer: Self-pay | Admitting: Hematology and Oncology

## 2017-06-10 ENCOUNTER — Inpatient Hospital Stay (HOSPITAL_BASED_OUTPATIENT_CLINIC_OR_DEPARTMENT_OTHER): Payer: Medicaid Other | Admitting: Hematology and Oncology

## 2017-06-10 ENCOUNTER — Emergency Department (HOSPITAL_COMMUNITY): Payer: Medicaid Other

## 2017-06-10 ENCOUNTER — Encounter (HOSPITAL_COMMUNITY): Payer: Self-pay

## 2017-06-10 VITALS — BP 109/79 | HR 105 | Temp 97.7°F | Resp 17 | Ht 67.0 in | Wt 101.7 lb

## 2017-06-10 DIAGNOSIS — Z923 Personal history of irradiation: Secondary | ICD-10-CM

## 2017-06-10 DIAGNOSIS — Z79899 Other long term (current) drug therapy: Secondary | ICD-10-CM

## 2017-06-10 DIAGNOSIS — G893 Neoplasm related pain (acute) (chronic): Secondary | ICD-10-CM | POA: Diagnosis not present

## 2017-06-10 DIAGNOSIS — E871 Hypo-osmolality and hyponatremia: Secondary | ICD-10-CM

## 2017-06-10 DIAGNOSIS — C069 Malignant neoplasm of mouth, unspecified: Secondary | ICD-10-CM | POA: Diagnosis present

## 2017-06-10 DIAGNOSIS — F17210 Nicotine dependence, cigarettes, uncomplicated: Secondary | ICD-10-CM | POA: Diagnosis not present

## 2017-06-10 DIAGNOSIS — D6489 Other specified anemias: Secondary | ICD-10-CM | POA: Diagnosis present

## 2017-06-10 DIAGNOSIS — R109 Unspecified abdominal pain: Secondary | ICD-10-CM | POA: Diagnosis present

## 2017-06-10 DIAGNOSIS — C140 Malignant neoplasm of pharynx, unspecified: Secondary | ICD-10-CM | POA: Diagnosis present

## 2017-06-10 DIAGNOSIS — C7801 Secondary malignant neoplasm of right lung: Secondary | ICD-10-CM | POA: Diagnosis present

## 2017-06-10 DIAGNOSIS — Z5111 Encounter for antineoplastic chemotherapy: Secondary | ICD-10-CM

## 2017-06-10 DIAGNOSIS — Z79891 Long term (current) use of opiate analgesic: Secondary | ICD-10-CM

## 2017-06-10 DIAGNOSIS — I10 Essential (primary) hypertension: Secondary | ICD-10-CM | POA: Diagnosis present

## 2017-06-10 DIAGNOSIS — C062 Malignant neoplasm of retromolar area: Secondary | ICD-10-CM

## 2017-06-10 DIAGNOSIS — K219 Gastro-esophageal reflux disease without esophagitis: Secondary | ICD-10-CM | POA: Diagnosis present

## 2017-06-10 DIAGNOSIS — R070 Pain in throat: Secondary | ICD-10-CM

## 2017-06-10 DIAGNOSIS — C7802 Secondary malignant neoplasm of left lung: Secondary | ICD-10-CM | POA: Diagnosis present

## 2017-06-10 DIAGNOSIS — R530 Neoplastic (malignant) related fatigue: Secondary | ICD-10-CM | POA: Diagnosis not present

## 2017-06-10 DIAGNOSIS — E860 Dehydration: Secondary | ICD-10-CM | POA: Diagnosis present

## 2017-06-10 DIAGNOSIS — C760 Malignant neoplasm of head, face and neck: Secondary | ICD-10-CM

## 2017-06-10 DIAGNOSIS — Z8249 Family history of ischemic heart disease and other diseases of the circulatory system: Secondary | ICD-10-CM

## 2017-06-10 DIAGNOSIS — Z930 Tracheostomy status: Secondary | ICD-10-CM

## 2017-06-10 DIAGNOSIS — R140 Abdominal distension (gaseous): Secondary | ICD-10-CM

## 2017-06-10 DIAGNOSIS — R0602 Shortness of breath: Secondary | ICD-10-CM | POA: Diagnosis not present

## 2017-06-10 LAB — BASIC METABOLIC PANEL
ANION GAP: 14 (ref 5–15)
ANION GAP: 12 (ref 5–15)
Anion gap: 16 — ABNORMAL HIGH (ref 5–15)
BUN: 11 mg/dL (ref 6–20)
BUN: 8 mg/dL (ref 6–20)
BUN: 9 mg/dL (ref 6–20)
CALCIUM: 9.5 mg/dL (ref 8.9–10.3)
CALCIUM: 9.2 mg/dL (ref 8.9–10.3)
CHLORIDE: 75 mmol/L — AB (ref 101–111)
CO2: 23 mmol/L (ref 22–32)
CO2: 24 mmol/L (ref 22–32)
CO2: 26 mmol/L (ref 22–32)
CREATININE: 0.77 mg/dL (ref 0.61–1.24)
CREATININE: 0.79 mg/dL (ref 0.61–1.24)
Calcium: 9.7 mg/dL (ref 8.9–10.3)
Chloride: 80 mmol/L — ABNORMAL LOW (ref 101–111)
Chloride: 82 mmol/L — ABNORMAL LOW (ref 101–111)
Creatinine, Ser: 1 mg/dL (ref 0.61–1.24)
GFR calc non Af Amer: >60 mL/min (ref >60)
Glucose, Bld: 106 mg/dL — ABNORMAL HIGH (ref 65–99)
Glucose, Bld: 119 mg/dL — ABNORMAL HIGH (ref 65–99)
Glucose, Bld: 110 mg/dL — ABNORMAL HIGH (ref 65–99)
Potassium: 3.7 mmol/L (ref 3.5–5.1)
Potassium: 3.5 mmol/L (ref 3.5–5.1)
Potassium: 3.6 mmol/L (ref 3.5–5.1)
SODIUM: 118 mmol/L — AB (ref 135–145)
SODIUM: 120 mmol/L — AB (ref 135–145)
Sodium: 114 mmol/L — CL (ref 135–145)

## 2017-06-10 LAB — CBC WITH DIFFERENTIAL (CANCER CENTER ONLY)
BASOS ABS: 0 10*3/uL (ref 0.0–0.1)
BASOS PCT: 1 %
Eosinophils Absolute: 0 10*3/uL (ref 0.0–0.5)
Eosinophils Relative: 0 %
HEMATOCRIT: 32.6 % — AB (ref 38.4–49.9)
HEMOGLOBIN: 11.8 g/dL — AB (ref 13.0–17.1)
Lymphocytes Relative: 5 %
Lymphs Abs: 0.2 10*3/uL — ABNORMAL LOW (ref 0.9–3.3)
MCH: 34.9 pg — ABNORMAL HIGH (ref 27.2–33.4)
MCHC: 36.2 g/dL — ABNORMAL HIGH (ref 32.0–36.0)
MCV: 96.4 fL (ref 79.3–98.0)
Monocytes Absolute: 0.9 10*3/uL (ref 0.1–0.9)
Monocytes Relative: 20 %
NEUTROS ABS: 3.3 10*3/uL (ref 1.5–6.5)
NEUTROS PCT: 74 %
Platelet Count: 341 10*3/uL (ref 140–400)
RBC: 3.38 MIL/uL — ABNORMAL LOW (ref 4.20–5.82)
RDW: 13.3 % (ref 11.0–14.6)
WBC Count: 4.4 10*3/uL (ref 4.0–10.3)

## 2017-06-10 LAB — CBC WITH DIFFERENTIAL/PLATELET
Basophils Absolute: 0 10*3/uL (ref 0.0–0.1)
Basophils Relative: 0 %
Eosinophils Absolute: 0 10*3/uL (ref 0.0–0.7)
Eosinophils Relative: 0 %
HEMATOCRIT: 28.6 % — AB (ref 39.0–52.0)
HEMOGLOBIN: 10.4 g/dL — AB (ref 13.0–17.0)
LYMPHS ABS: 0.6 10*3/uL — AB (ref 0.7–4.0)
LYMPHS PCT: 12 %
MCH: 34.7 pg — AB (ref 26.0–34.0)
MCHC: 36.4 g/dL — AB (ref 30.0–36.0)
MCV: 95.3 fL (ref 78.0–100.0)
Monocytes Absolute: 0.7 10*3/uL (ref 0.1–1.0)
Monocytes Relative: 14 %
NEUTROS ABS: 3.4 10*3/uL (ref 1.7–7.7)
NEUTROS PCT: 74 %
Platelets: 323 10*3/uL (ref 150–400)
RBC: 3 MIL/uL — ABNORMAL LOW (ref 4.22–5.81)
RDW: 13.1 % (ref 11.5–15.5)
WBC: 4.7 10*3/uL (ref 4.0–10.5)

## 2017-06-10 LAB — CMP (CANCER CENTER ONLY)
ALK PHOS: 94 U/L (ref 40–150)
ALT: 16 U/L (ref 0–55)
ANION GAP: 17 — AB (ref 3–11)
AST: 23 U/L (ref 5–34)
Albumin: 3.8 g/dL (ref 3.5–5.0)
BILIRUBIN TOTAL: 0.5 mg/dL (ref 0.2–1.2)
BUN: 10 mg/dL (ref 7–26)
CALCIUM: 10.7 mg/dL — AB (ref 8.4–10.4)
CO2: 25 mmol/L (ref 22–29)
Chloride: 73 mmol/L — ABNORMAL LOW (ref 98–109)
Creatinine: 1.25 mg/dL (ref 0.70–1.30)
GFR, Estimated: >60 mL/min (ref >60)
Glucose, Bld: 118 mg/dL (ref 70–140)
Potassium: 3.7 mmol/L (ref 3.5–5.1)
Sodium: 115 mmol/L — CL (ref 136–145)
TOTAL PROTEIN: 8.2 g/dL (ref 6.4–8.3)

## 2017-06-10 LAB — URINALYSIS, ROUTINE W REFLEX MICROSCOPIC
Bilirubin Urine: NEGATIVE
GLUCOSE, UA: NEGATIVE mg/dL
HGB URINE DIPSTICK: NEGATIVE
Ketones, ur: 20 mg/dL — AB
LEUKOCYTES UA: NEGATIVE
Nitrite: NEGATIVE
PROTEIN: NEGATIVE mg/dL
SPECIFIC GRAVITY, URINE: 1.003 — AB (ref 1.005–1.030)
pH: 5 (ref 5.0–8.0)

## 2017-06-10 LAB — SODIUM, URINE, RANDOM: Sodium, Ur: 15 mmol/L

## 2017-06-10 LAB — MAGNESIUM: Magnesium: 0.8 mg/dL — CL (ref 1.7–2.4)

## 2017-06-10 LAB — CREATININE, URINE, RANDOM: Creatinine, Urine: 49.8 mg/dL

## 2017-06-10 MED ORDER — ONDANSETRON HCL 8 MG PO TABS: 8.0000 mg | ORAL_TABLET | Freq: Three times a day (TID) | ORAL | Status: DC | PRN

## 2017-06-10 MED ORDER — LISINOPRIL 20 MG PO TABS
20.0000 mg | ORAL_TABLET | Freq: Every day | ORAL | Status: DC
  Administered 2017-06-12 – 2017-06-14 (×3): 20 mg via ORAL
  Filled 2017-06-10 (×3): qty 1

## 2017-06-10 MED ORDER — SODIUM CHLORIDE 0.9 % IV BOLUS
1000.0000 mL | Freq: Once | INTRAVENOUS | Status: AC
  Administered 2017-06-10: 1000 mL via INTRAVENOUS

## 2017-06-10 MED ORDER — OXYCODONE HCL ER 10 MG PO T12A
10.0000 mg | EXTENDED_RELEASE_TABLET | Freq: Two times a day (BID) | ORAL | Status: DC
  Administered 2017-06-10 – 2017-06-11 (×3): 10 mg via ORAL
  Filled 2017-06-10 (×3): qty 1

## 2017-06-10 MED ORDER — MAGNESIUM OXIDE 400 (241.3 MG) MG PO TABS
400.0000 mg | ORAL_TABLET | Freq: Two times a day (BID) | ORAL | Status: DC
  Administered 2017-06-10 – 2017-06-14 (×8): 400 mg via ORAL
  Filled 2017-06-10 (×8): qty 1

## 2017-06-10 MED ORDER — HEPARIN SODIUM (PORCINE) 5000 UNIT/ML IJ SOLN
5000.0000 [IU] | Freq: Three times a day (TID) | INTRAMUSCULAR | Status: DC
  Administered 2017-06-11 – 2017-06-14 (×8): 5000 [IU] via SUBCUTANEOUS
  Filled 2017-06-10 (×9): qty 1

## 2017-06-10 MED ORDER — ACETAMINOPHEN 500 MG PO TABS
1000.0000 mg | ORAL_TABLET | Freq: Four times a day (QID) | ORAL | Status: DC | PRN
  Administered 2017-06-10 – 2017-06-11 (×2): 1000 mg via ORAL
  Filled 2017-06-10 (×2): qty 2

## 2017-06-10 MED ORDER — SODIUM CHLORIDE 0.9 % IV SOLN
INTRAVENOUS | Status: DC
  Administered 2017-06-10 – 2017-06-11 (×2): via INTRAVENOUS

## 2017-06-10 MED ORDER — PROCHLORPERAZINE MALEATE 10 MG PO TABS: 10.0000 mg | ORAL_TABLET | Freq: Four times a day (QID) | ORAL | Status: DC | PRN

## 2017-06-10 MED ORDER — MAGNESIUM SULFATE 2 GM/50ML IV SOLN
2.0000 g | Freq: Once | INTRAVENOUS | Status: AC
  Administered 2017-06-10: 2 g via INTRAVENOUS
  Filled 2017-06-10: qty 50

## 2017-06-10 NOTE — ED Notes (Signed)
Patient made aware urine sample is needed. 

## 2017-06-10 NOTE — ED Provider Notes (Signed)
[facility] DEPT Provider Note   CSN: [id number] Arrival date & time: 06/10/17  0944     History   Chief Complaint Chief Complaint  Patient presents with  . Abnormal Lab  . Fatigue    HPI Lawrence Arias is a 57 y.o. 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  Patient states that about 2 weeks ago, his G-tube was removed.  He has been drinking Ensure to keep himself hydrated but has been having poor appetite.  He states that he has been drinking less but denies trouble swallowing.  He states that he has been coughing but denies any fevers or vomiting or abdominal pain.  Patient went to oncology clinic today to start chemotherapy but was noted to be hyponatremic with sodium 115.  Patient is sent here for hydration.   The history is provided by the patient.    Past Medical History:  Diagnosis Date  . Cancer ([place])   . GERD (gastroesophageal reflux disease)    uses OTC prn  . History of radiation therapy 12/31/16- 02/23/2017   Left oral cavity and regional nodes, 66 Gy in 33 fractions.   . Hypertension   . Tongue lesion     Patient Active Problem List   Diagnosis Date Noted  . Hypokalemia 05/07/2017  . Oral cancer ([place]) 08/25/2016  . 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 DIRECT LARYNGOSCOPY N/A 07/24/2016   Procedure: DIRECT LARYNGOSCOPY;  Surgeon: Melissa Montane, MD;  Location: [facility];  Service: ENT;  Laterality: N/A;  Direct laryngoscopy with biopsy tongue lesion  . FRACTURE SURGERY     left wrist, remote  . FREE FLAP RADIAL FOREARM Left 10/16/2016   Dr. Hendricks [facility]  . free flap scapular Left 10/16/2016   Dr. Hendricks Limes - [facility] hospital  . GASTROSTOMY  10/27/2016   Dr. Daphene Calamity- [facility] hospital.   . IR FLUORO GUIDE PORT INSERTION RIGHT   12/22/2016  . IR US GUIDE VASC ACCESS RIGHT  12/22/2016  . NECK DISSECTION Bilateral 10/16/2016   Selective neck dissection, Dr. Hendricks Limes [facility]  . 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 Hendricks Limes [facility]  . SKIN GRAFT Left 10/16/2016   Split thickness skin graft, leg Dr. Hendricks Limes. [facility]  . TONGUE BIOPSY  07/24/2016   Procedure: TONGUE BIOPSY;  Surgeon: Melissa Montane, MD;  Location: [facility];  Service: ENT;;  . TRACHEOSTOMY  10/16/2016   Dr. Hendricks Limes [facility]  . TRACHEOSTOMY TUBE PLACEMENT N/A 04/29/2017   Procedure: TRACHEOSTOMY REVISION;  Surgeon: Jodi Marble, MD;  Location: [facility];  Service: ENT;  Laterality: N/A;        Home Medications    Prior to Admission medications   Medication Sig Start Date End Date Taking? Authorizing Provider  acetaminophen (TYLENOL) 500 MG tablet Take 1,000 mg by mouth every 6 (six) hours as needed for moderate pain.   Yes [provider]  lisinopril (PRINIVIL,ZESTRIL) 20 MG tablet Take 20 mg by mouth daily. 05/23/17  Yes [provider]  magnesium oxide (MAG-OX) 400 (241.3 Mg) MG tablet Take 1 tablet (400 mg total) by mouth 2 (two) times daily. 05/21/17  Yes Gorsuch, Ni, MD  ondansetron (ZOFRAN) 8 MG tablet Take 1 tablet (8 mg total) by mouth every 8 (eight) hours as needed for nausea. 05/21/17  Yes Heath Lark, MD  oxyCODONE (OXYCONTIN) 10 mg 12 hr tablet Take 1 tablet (10 mg total) by mouth every 12 (twelve) hours for 14 days. 05/28/17 06/11/17 Yes Perlov, Marinell Blight, MD  oxyCODONE 10 MG TABS Take 0.5 tablets (5 mg total) by mouth every 4 (four) hours as needed for up to 14 days for severe pain. 05/28/17 06/11/17 Yes Perlov, Marinell Blight, MD  polyethylene glycol powder [facility]) powder Use as directed for constipation. Follow directions on container. 01/11/17  Yes Eppie Gibson, MD  prochlorperazine (COMPAZINE) 10 MG tablet Take 1 tablet (10 mg total) by mouth every 6 (six) hours as needed for nausea or vomiting. 05/21/17  Yes  Gorsuch, Ni, MD  buPROPion (WELLBUTRIN SR) 150 MG 12 hr tablet Start one week before quit date. Take 1 tab daily x 3 days, then 1 tab BID thereafter. Patient not taking: Reported on 06/10/2017 08/25/16   Eppie Gibson, MD  gabapentin (NEURONTIN) 300 MG capsule Take 1 capsule (300 mg total) by mouth 3 (three) times daily. To address cancer related pain. Patient not taking: Reported on 06/10/2017 08/25/16   Eppie Gibson, MD  lidocaine (XYLOCAINE) 2 % solution Use as directed 20 mLs in the mouth or throat as needed for mouth pain. Patient not taking: Reported on 06/10/2017 08/15/16   Carlisle Cater, PA-C  nicotine (NICODERM CQ - DOSED IN MG/24 HOURS) 14 mg/24hr patch Place 1 patch (14 mg total) onto the skin daily. Apply 21 mg patch daily x 6 wk, then 14mg  patch daily x 2 wk, then 7 mg patch daily x 2 wk Patient not taking: Reported on 06/10/2017 08/25/16   Eppie Gibson, MD  nicotine (NICODERM CQ - DOSED IN MG/24 HR) 7 mg/24hr patch Place 1 patch (7 mg total) onto the skin daily. Apply 21 mg patch daily x 6 wk, then 14mg  patch daily x 2 wk, then 7 mg patch daily x 2 wk Patient not taking: Reported on 06/10/2017 08/25/16   Eppie Gibson, MD  potassium chloride (K-DUR) 10 MEQ tablet Take 2 tablets (20 mEq total) by mouth 2 (two) times daily for 14 days. 05/11/17 05/25/17  Ardath Sax, MD    Family History Family History  Problem Relation Age of Onset  . Hypertension Mother   . Hypertension Father     Social History Social History   Tobacco Use  . Smoking status: Current Some Day Smoker    Packs/day: 0.50    Types: Cigarettes  . Smokeless tobacco: Never Used  . Tobacco comment: he is smoking 0ne every other day (11/20/16)  Substance Use Topics  . Alcohol use: Not on file  . Drug use: No     Allergies   Patient has no known allergies.   Review of Systems Review of Systems  Constitutional: Positive for appetite change and fatigue.  Respiratory: Positive for cough.   All other systems  reviewed and are negative.    Physical Exam Updated Vital Signs BP 116/86   Pulse 74   Temp (!) 97.4 F (36.3 C) (Oral)   Resp 18   SpO2 100%   Physical Exam  Constitutional: He is oriented to person, place, and time.  Chronically ill   HENT:  Head: Normocephalic.  MM dry, obvious mass in the neck   Eyes: Pupils are equal, round, and reactive to light. EOM are normal.  Neck:  Trach in place with yellowish sputum   Cardiovascular: Normal rate, regular rhythm and normal heart sounds.  Pulmonary/Chest:  Diminished breath  sounds bilaterally   Abdominal: Soft. Bowel sounds are normal. He exhibits no distension. There is no tenderness. There is no guarding.  Musculoskeletal: Normal range of motion.  Neurological: He is alert and oriented to person, place, and time. No cranial nerve deficit. Coordination normal.  Skin: Skin is warm.  Psychiatric: He has a normal mood and affect.  Nursing note and vitals reviewed.    ED Treatments / Results  Labs (all labs ordered are listed, but only abnormal results are displayed) Labs Reviewed  CBC WITH DIFFERENTIAL/PLATELET - Abnormal; Notable for the following components:      Result Value   RBC 3.00 (*)    Hemoglobin 10.4 (*)    HCT 28.6 (*)    MCH 34.7 (*)    MCHC 36.4 (*)    Lymphs Abs 0.6 (*)    All other components within normal limits  BASIC METABOLIC PANEL - Abnormal; Notable for the following components:   Sodium 114 (*)    Chloride 75 (*)    Glucose, Bld 106 (*)    Anion gap 16 (*)    All other components within normal limits  OSMOLALITY  URINALYSIS, ROUTINE W REFLEX MICROSCOPIC  OSMOLALITY, URINE  SODIUM, URINE, RANDOM  CREATININE, URINE, RANDOM    EKG None  Radiology Dg Chest Port 1 View  Result Date: 06/10/2017 CLINICAL DATA:  Increased fatigue, history of tonsillar carcinoma with tracheostomy, throat pain EXAM: PORTABLE CHEST 1 VIEW COMPARISON:  CT chest of 03/25/2017 and chest x-ray of 05/21/2016 FINDINGS:  The lungs remain clear and somewhat hyperaerated. No definite enlarging lung nodule is evident by chest x-ray. Right-sided Port-A-Cath is present with the tip overlying the lower SVC and tracheostomy is noted. Mediastinal and hilar contours are stable and heart size is within normal limits. Surgical clips overlie the left axilla. Old healed right rib fractures are noted. IMPRESSION: 1. Hyperaeration.  No active lung disease. 2. Port-A-Cath tip overlies the lower SVC. 3. Tracheostomy is present. Electronically Signed   By: Ivar Drape M.D.   On: 06/10/2017 10:49    Procedures Procedures (including critical care time)  CRITICAL CARE Performed by: Wandra Arthurs   Total critical care time:30 minutes  Critical care time was exclusive of separately billable procedures and treating other patients.  Critical care was necessary to treat or prevent imminent or life-threatening deterioration.  Critical care was time spent personally by me on the following activities: development of treatment plan with patient and/or surrogate as well as nursing, discussions with consultants, evaluation of patient's response to treatment, examination of patient, obtaining history from patient or surrogate, ordering and performing treatments and interventions, ordering and review of laboratory studies, ordering and review of radiographic studies, pulse oximetry and re-evaluation of patient's condition.   Medications Ordered in ED Medications  sodium chloride 0.9 % bolus 1,000 mL (0 mLs Intravenous Stopped 06/10/17 1142)  magnesium sulfate IVPB 2 g 50 mL (0 g Intravenous Stopped 06/10/17 1142)     Initial Impression / Assessment and Plan / ED Course  I have reviewed the triage vital signs and the nursing notes.  Pertinent labs & imaging results that were available during my care of the patient were reviewed by me and considered in my medical decision making (see chart for details).     LUE DUBUQUE is a 57 y.o.  male here with hyponatremia, fatigue, poor appetite. Had G tube removed recent and has poor intake. I suspect that hyponatremia likely secondary to dehydration. Will repeat  sodium level and add serum or urine osms. Will hydrate patient as well.   12:03 PM Repeat Na is 114. Osms pending. Given 1 L NS bolus. Hospitalist to admit for hyponatremia likely from dehydration.   Final Clinical Impressions(s) / ED Diagnoses   Final diagnoses:  None    ED Discharge Orders    None       Drenda Freeze, MD 06/10/17 1203

## 2017-06-10 NOTE — ED Notes (Signed)
Patient given sprite.

## 2017-06-10 NOTE — ED Notes (Signed)
Hospitalist at bedside 

## 2017-06-10 NOTE — ED Notes (Signed)
Respiratory at bedside providing trach care.

## 2017-06-10 NOTE — ED Triage Notes (Signed)
Patient [facility] staff with c/o increased fatigue. Cancer center labs show low sodium and magnesium. 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 Patient reports 7/10 pain to his right throat, which he claims is "normal for him." Patient's chemo was held today due to patient's abnormal labs. Patient's last chemo infusion was 3 weeks ago. Patient protecting airway and able to communicate without distress.

## 2017-06-10 NOTE — ED Notes (Signed)
Patient given apple sauce

## 2017-06-10 NOTE — ED Notes (Signed)
ED TO INPATIENT HANDOFF REPORT  Name/Age/Gender Lawrence Arias 57 y.o. male  Code Status   Home/SNF/Other Home  Chief Complaint low sodium  Level of Care/Admitting Diagnosis ED Disposition    ED Disposition Condition [facility] Area: [place] [100102]  Level of Care: Med-Surg [16]  Diagnosis: Hyponatremia [856314]  Admitting Physician: Velvet Bathe [[id number]]  Attending Physician: Velvet Bathe [[id number]]  PT Class (Do Not Modify): Observation [104]  PT Acc Code (Do Not Modify): Observation [10022]       Medical History Past Medical History:  Diagnosis Date  . Cancer ([place])   . GERD (gastroesophageal reflux disease)    uses OTC prn  . History of radiation therapy 12/31/16- 02/23/2017   Left oral cavity and regional nodes, 66 Gy in 33 fractions.   . Hypertension   . Tongue lesion     Allergies No Known Allergies  IV Location/Drains/Wounds Patient Lines/Drains/Airways Status   Active Line/Drains/Airways    Name:   Placement date:   Placement time:   Site:   Days:   Implanted Port 12/22/16 Right Chest   12/22/16    1534    Chest   170   Incision (Closed) 04/29/17 Neck Other (Comment)   04/29/17    0902     42   Tracheostomy [place] 4 mm Uncuffed   04/29/17    0844    4 mm   42          Labs/Imaging Results for orders placed or performed during the hospital encounter of 06/10/17 (from the past 48 hour(s))  CBC with Differential/Platelet     Status: Abnormal   Collection Time: 06/10/17 10:20 AM  Result Value Ref Range   WBC 4.7 4.0 - 10.5 K/uL   RBC 3.00 (L) 4.22 - 5.81 MIL/uL   Hemoglobin 10.4 (L) 13.0 - 17.0 g/dL   HCT 28.6 (L) 39.0 - 52.0 %   MCV 95.3 78.0 - 100.0 fL   MCH 34.7 (H) 26.0 - 34.0 pg   MCHC 36.4 (H) 30.0 - 36.0 g/dL   RDW 13.1 11.5 - 15.5 %   Platelets 323 150 - 400 K/uL   Neutrophils Relative % 74 %   Neutro Abs 3.4 1.7 - 7.7 K/uL   Lymphocytes Relative 12 %   Lymphs Abs 0.6 (L) 0.7 - 4.0 K/uL   Monocytes Relative 14 %   Monocytes Absolute 0.7 0.1 - 1.0 K/uL   Eosinophils Relative 0 %   Eosinophils Absolute 0.0 0.0 - 0.7 K/uL   Basophils Relative 0 %   Basophils Absolute 0.0 0.0 - 0.1 K/uL    Comment: Performed at [facility], [place] [address], [place], [place] [address]  Basic metabolic panel     Status: Abnormal   Collection Time: 06/10/17 10:20 AM  Result Value Ref Range   Sodium 114 (LL) 135 - 145 mmol/L    Comment: CRITICAL RESULT CALLED TO, READ BACK BY AND VERIFIED WITH: SPENCER RN AT 1144 06/10/17 TIBBITTS,K    Potassium 3.7 3.5 - 5.1 mmol/L   Chloride 75 (L) 101 - 111 mmol/L   CO2 23 22 - 32 mmol/L   Glucose, Bld 106 (H) 65 - 99 mg/dL   BUN 11 6 - 20 mg/dL   Creatinine, Ser 1.00 0.61 - 1.24 mg/dL   Calcium 9.7 8.9 - 10.3 mg/dL   GFR calc non Af Amer >60 >60 mL/min   GFR calc Af Amer >60 >60 mL/min  Comment: (NOTE) The eGFR has been calculated using the CKD EPI equation. This calculation has not been validated in all clinical situations. eGFR's persistently <60 mL/min signify possible Chronic Kidney Disease.    Anion gap 16 (H) 5 - 15    Comment: Performed at [facility], [place] [address], [place], [place] [address]  Urinalysis, Routine w reflex microscopic     Status: Abnormal   Collection Time: 06/10/17 12:09 PM  Result Value Ref Range   Color, Urine STRAW (A) YELLOW   APPearance CLEAR CLEAR   Specific Gravity, Urine 1.003 (L) 1.005 - 1.030   pH 5.0 5.0 - 8.0   Glucose, UA NEGATIVE NEGATIVE mg/dL   Hgb urine dipstick NEGATIVE NEGATIVE   Bilirubin Urine NEGATIVE NEGATIVE   Ketones, ur 20 (A) NEGATIVE mg/dL   Protein, ur NEGATIVE NEGATIVE mg/dL   Nitrite NEGATIVE NEGATIVE   Leukocytes, UA NEGATIVE NEGATIVE    Comment: Performed at [facility] [address] [place], [place] [address]  Sodium, urine, random     Status: None   Collection Time: 06/10/17 12:09 PM  Result Value Ref Range   Sodium,  Ur 15 mmol/L    Comment: Performed at [facility], [place] [address], [place], [place] [address]  Creatinine, urine, random     Status: None   Collection Time: 06/10/17 12:09 PM  Result Value Ref Range   Creatinine, Urine 49.80 mg/dL    Comment: Performed at [facility], [place] [address], [place], [place] [address]   Dg Chest Port 1 View  Result Date: 06/10/2017 CLINICAL DATA:  Increased fatigue, history of tonsillar carcinoma with tracheostomy, throat pain EXAM: PORTABLE CHEST 1 VIEW COMPARISON:  CT chest of 03/25/2017 and chest x-ray of 05/21/2016 FINDINGS: The lungs remain clear and somewhat hyperaerated. No definite enlarging lung nodule is evident by chest x-ray. Right-sided Port-A-Cath is present with the tip overlying the lower SVC and tracheostomy is noted. Mediastinal and hilar contours are stable and heart size is within normal limits. Surgical clips overlie the left axilla. Old healed right rib fractures are noted. IMPRESSION: 1. Hyperaeration.  No active lung disease. 2. Port-A-Cath tip overlies the lower SVC. 3. Tracheostomy is present. Electronically Signed   By: Ivar Drape M.D.   On: 06/10/2017 10:49    Pending Labs Unresulted Labs (From admission, onward)   Start     Ordered   06/11/17 0500  Magnesium  Tomorrow morning,   R     06/10/17 1243   06/10/17 1004  Osmolality  STAT,   STAT     06/10/17 1003   06/10/17 1004  Osmolality, urine  Once,   STAT     06/10/17 1003   Signed and Held  Sodium, urine, random  Once,   R     Signed and Held   Signed and Held  Osmolality, urine  Once,   R     Signed and Held   Signed and Held  CBC  (heparin)  Once,   R    Comments:  Baseline for heparin therapy IF NOT ALREADY DRAWN.  Notify MD if PLT < 100 K.    Signed and Held   Signed and Held  Creatinine, serum  (heparin)  Once,   R    Comments:  Baseline for heparin therapy IF NOT ALREADY DRAWN.    Signed and Held   Signed and Held  HIV antibody  (Routine Testing)  Once,   R  Signed and Held   Signed and Held  CBC  Tomorrow morning,   R     Signed and Held   Signed and Held  Basic metabolic panel  Now then every 4 hours,   R     Signed and Held      Vitals/Pain Today's Vitals   06/10/17 1237 06/10/17 1300 06/10/17 1330 06/10/17 1400  BP: (!) 133/100 115/78 (!) 125/97 118/88  Pulse: 78 84 87 84  Resp: '14 16 16 17  '$ Temp:      TempSrc:      SpO2: 99% 100% 100% 99%    Isolation Precautions No active isolations  Medications Medications  sodium chloride 0.9 % bolus 1,000 mL (0 mLs Intravenous Stopped 06/10/17 1142)  magnesium sulfate IVPB 2 g 50 mL (0 g Intravenous Stopped 06/10/17 1142)    Mobility

## 2017-06-10 NOTE — H&P (Addendum)
History and Physical    TRENT GABLER EVO:[id number] DOB: December 10, 1960 DOA: 06/10/2017  PCP: System, Provider Not In  Patient coming from: home  Chief Complaint:   HPI: Lawrence Arias is a 57 y.o. male with medical history significant of throat cancer specifically squamous cell cancer of which she is currently getting treated for as an outpatient. He sees Dr. Alvy Bimler. On routine follow patient was found to have low sodium levels and a such was referred to the emergency department for further evaluation recommendations. The patient reports poor oral intake after recent removal of G-tube. But otherwise has no new complaints  ED Course: patient was found to have a sodium level CXIV and was bolused. Also found to have low magnesium levels which they have replaced IV. We were subsequently consulted for further evaluation recommendations.  Review of Systems: As per HPI otherwise 10 point review of systems negative.    Past Medical History:  Diagnosis Date  . Cancer ([place])   . GERD (gastroesophageal reflux disease)    uses OTC prn  . History of radiation therapy 12/31/16- 02/23/2017   Left oral cavity and regional nodes, 66 Gy in 33 fractions.   . Hypertension   . Tongue lesion     Past Surgical History:  Procedure Laterality Date  . DIRECT LARYNGOSCOPY N/A 07/24/2016   Procedure: DIRECT LARYNGOSCOPY;  Surgeon: Melissa Montane, MD;  Location: [facility];  Service: ENT;  Laterality: N/A;  Direct laryngoscopy with biopsy tongue lesion  . FRACTURE SURGERY     left wrist, remote  . FREE FLAP RADIAL FOREARM Left 10/16/2016   Dr. Hendricks Limes -[facility]  . free flap scapular Left 10/16/2016   Dr. Hendricks Limes - [facility] hospital  . GASTROSTOMY  10/27/2016   Dr. Daphene Calamity- [facility] hospital.   . IR FLUORO GUIDE PORT INSERTION RIGHT  12/22/2016  . IR US GUIDE VASC ACCESS RIGHT  12/22/2016  . NECK DISSECTION Bilateral 10/16/2016   Selective neck  dissection, Dr. Hendricks Limes [facility]  . 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 Hendricks Limes [facility]  . SKIN GRAFT Left 10/16/2016   Split thickness skin graft, leg Dr. Hendricks Limes. [facility]  . TONGUE BIOPSY  07/24/2016   Procedure: TONGUE BIOPSY;  Surgeon: Melissa Montane, MD;  Location: [place];  Service: ENT;;  . TRACHEOSTOMY  10/16/2016   Dr. Hendricks Limes [facility]  . TRACHEOSTOMY TUBE PLACEMENT N/A 04/29/2017   Procedure: TRACHEOSTOMY REVISION;  Surgeon: Jodi Marble, MD;  Location: [place];  Service: ENT;  Laterality: N/A;     reports that he has been smoking cigarettes.  He has been smoking about 0.50 packs per day. He has never used smokeless tobacco. He reports that he does not use drugs. His alcohol history is not on file.  No Known Allergies  Family History  Problem Relation Age of Onset  . Hypertension Mother   . Hypertension Father     Prior to Admission medications   Medication Sig Start Date End Date Taking? Authorizing Provider  acetaminophen (TYLENOL) 500 MG tablet Take 1,000 mg by mouth every 6 (six) hours as needed for moderate pain.   Yes [provider]  lisinopril (PRINIVIL,ZESTRIL) 20 MG tablet Take 20 mg by mouth daily. 05/23/17  Yes [provider]  magnesium oxide (MAG-OX) 400 (241.3 Mg) MG tablet Take 1 tablet (400 mg total) by mouth 2 (two) times daily. 05/21/17  Yes Heath Lark, MD  ondansetron [facility])  8 MG tablet Take 1 tablet (8 mg total) by mouth every 8 (eight) hours as needed for nausea. 05/21/17  Yes Heath Lark, MD  oxyCODONE (OXYCONTIN) 10 mg 12 hr tablet Take 1 tablet (10 mg total) by mouth every 12 (twelve) hours for 14 days. 05/28/17 06/11/17 Yes Perlov, Marinell Blight, MD  oxyCODONE 10 MG TABS Take 0.5 tablets (5 mg total) by mouth every 4 (four) hours as needed for up to 14 days for severe pain. 05/28/17 06/11/17 Yes Perlov, Marinell Blight, MD  polyethylene glycol powder [facility]) powder Use as directed for constipation. Follow directions on  container. 01/11/17  Yes Eppie Gibson, MD  prochlorperazine (COMPAZINE) 10 MG tablet Take 1 tablet (10 mg total) by mouth every 6 (six) hours as needed for nausea or vomiting. 05/21/17  Yes Gorsuch, Ni, MD  buPROPion (WELLBUTRIN SR) 150 MG 12 hr tablet Start one week before quit date. Take 1 tab daily x 3 days, then 1 tab BID thereafter. Patient not taking: Reported on 06/10/2017 08/25/16   Eppie Gibson, MD  gabapentin (NEURONTIN) 300 MG capsule Take 1 capsule (300 mg total) by mouth 3 (three) times daily. To address cancer related pain. Patient not taking: Reported on 06/10/2017 08/25/16   Eppie Gibson, MD  lidocaine (XYLOCAINE) 2 % solution Use as directed 20 mLs in the mouth or throat as needed for mouth pain. Patient not taking: Reported on 06/10/2017 08/15/16   Carlisle Cater, PA-C  nicotine (NICODERM CQ - DOSED IN MG/24 HOURS) 14 mg/24hr patch Place 1 patch (14 mg total) onto the skin daily. Apply 21 mg patch daily x 6 wk, then 14mg  patch daily x 2 wk, then 7 mg patch daily x 2 wk Patient not taking: Reported on 06/10/2017 08/25/16   Eppie Gibson, MD  nicotine (NICODERM CQ - DOSED IN MG/24 HR) 7 mg/24hr patch Place 1 patch (7 mg total) onto the skin daily. Apply 21 mg patch daily x 6 wk, then 14mg  patch daily x 2 wk, then 7 mg patch daily x 2 wk Patient not taking: Reported on 06/10/2017 08/25/16   Eppie Gibson, MD  potassium chloride (K-DUR) 10 MEQ tablet Take 2 tablets (20 mEq total) by mouth 2 (two) times daily for 14 days. 05/11/17 05/25/17  Ardath Sax, MD    Physical Exam: Vitals:   06/10/17 1101 06/10/17 1130 06/10/17 1200 06/10/17 1237  BP: (!) 115/96 116/86 (!) 162/119 (!) 133/100  Pulse: 89 74 85 78  Resp: 20 18 (!) 23 14  Temp:      TempSrc:      SpO2: 98% 100% 97% 99%    Constitutional: NAD, calm, comfortable Vitals:   06/10/17 1101 06/10/17 1130 06/10/17 1200 06/10/17 1237  BP: (!) 115/96 116/86 (!) 162/119 (!) 133/100  Pulse: 89 74 85 78  Resp: 20 18 (!) 23 14  Temp:       TempSrc:      SpO2: 98% 100% 97% 99%   Eyes: PERRL, lids and conjunctivae normal ENMT: Mucous membranes are dry. Posterior pharynx clear of any exudate Neck: normal, supple, no masses, no thyromegaly Respiratory: clear to auscultation bilaterally, no wheezing, no crackles. Normal respiratory effort. No accessory muscle use.  Cardiovascular: Regular rate and rhythm, no murmurs / rubs / gallops Abdomen: no tenderness, no masses palpated.  Musculoskeletal: no clubbing / cyanosis.  Normal muscle tone.  Skin: no rashes, lesions, ulcers. No induration Neurologic: answers questions appropriately, moves extremities equally Psychiatric:  Normal mood.    Labs on  Admission: I have personally reviewed following labs and imaging studies  CBC: Recent Labs  Lab 06/10/17 0832 06/10/17 1020  WBC 4.4 4.7  NEUTROABS 3.3 3.4  HGB 11.8* 10.4*  HCT 32.6* 28.6*  MCV 96.4 95.3  PLT 341 144   Basic Metabolic Panel: Recent Labs  Lab 06/10/17 0832 06/10/17 1020  NA 115* 114*  K 3.7 3.7  CL 73* 75*  CO2 25 23  GLUCOSE 118 106*  BUN 10 11  CREATININE 1.25 1.00  CALCIUM 10.7* 9.7  MG 0.8*  --    GFR: Estimated Creatinine Clearance: 52.5 mL/min (by C-G formula based on SCr of 1 mg/dL). Liver Function Tests: Recent Labs  Lab 06/10/17 0832  AST 23  ALT 16  ALKPHOS 94  BILITOT 0.5  PROT 8.2  ALBUMIN 3.8   No results for input(s): LIPASE, AMYLASE in the last 168 hours. No results for input(s): AMMONIA in the last 168 hours. Coagulation Profile: No results for input(s): INR, PROTIME in the last 168 hours. Cardiac Enzymes: No results for input(s): CKTOTAL, CKMB, CKMBINDEX, TROPONINI in the last 168 hours. BNP (last 3 results) No results for input(s): PROBNP in the last 8760 hours. HbA1C: No results for input(s): HGBA1C in the last 72 hours. CBG: No results for input(s): GLUCAP in the last 168 hours. Lipid Profile: No results for input(s): CHOL, HDL, LDLCALC, TRIG, CHOLHDL,  LDLDIRECT in the last 72 hours. Thyroid Function Tests: No results for input(s): TSH, T4TOTAL, FREET4, T3FREE, THYROIDAB in the last 72 hours. Anemia Panel: No results for input(s): VITAMINB12, FOLATE, FERRITIN, TIBC, IRON, RETICCTPCT in the last 72 hours. Urine analysis:    Component Value Date/Time   COLORURINE YELLOW 07/01/2012 2214   APPEARANCEUR CLOUDY (A) 07/01/2012 2214   LABSPEC 1.017 07/01/2012 2214   PHURINE 7.5 07/01/2012 2214   GLUCOSEU NEGATIVE 07/01/2012 2214   HGBUR NEGATIVE 07/01/2012 2214   BILIRUBINUR NEGATIVE 07/01/2012 2214   KETONESUR 15 (A) 07/01/2012 2214   PROTEINUR 30 (A) 07/01/2012 2214   UROBILINOGEN 1.0 07/01/2012 2214   NITRITE NEGATIVE 07/01/2012 2214   LEUKOCYTESUR NEGATIVE 07/01/2012 2214    Radiological Exams on Admission: Dg Chest Port 1 View  Result Date: 06/10/2017 CLINICAL DATA:  Increased fatigue, history of tonsillar carcinoma with tracheostomy, throat pain EXAM: PORTABLE CHEST 1 VIEW COMPARISON:  CT chest of 03/25/2017 and chest x-ray of 05/21/2016 FINDINGS: The lungs remain clear and somewhat hyperaerated. No definite enlarging lung nodule is evident by chest x-ray. Right-sided Port-A-Cath is present with the tip overlying the lower SVC and tracheostomy is noted. Mediastinal and hilar contours are stable and heart size is within normal limits. Surgical clips overlie the left axilla. Old healed right rib fractures are noted. IMPRESSION: 1. Hyperaeration.  No active lung disease. 2. Port-A-Cath tip overlies the lower SVC. 3. Tracheostomy is present. Electronically Signed   By: Ivar Drape M.D.   On: 06/10/2017 10:49    Assessment/Plan Active Problems:   Hyponatremia - obtaining urine sodium and urine osmolality. As well as serum sodium and osmolality. Suspecting low oral sodium intake given history of poor oral intake after removal of G tube in patient with oral squamous cell ca. - for now will replace with IVF's.  - addendum: order placed for  serial bmp's    Hypomagnesemia - replaced with 2 grams of magnesium, again this most likely due to poor oral intake    Oral cancer ([place]) - continued f/u with oncologist after hospital discharge   DVT prophylaxis: heparin Code  Status: full Family Communication: none at bedside. Disposition Plan: med surg Consults called: none Admission status: obs   Velvet Bathe MD [facility] Pager [phone]  If 7PM-7AM, please contact night-coverage [email or web address] Password [facility]  06/10/2017, 12:44 PM

## 2017-06-10 NOTE — ED Notes (Signed)
Family at bedside. 

## 2017-06-10 NOTE — Progress Notes (Signed)
Chemo canceled per Dr. Lebron Conners. Pt sent to ED for hyponatremia.

## 2017-06-10 NOTE — Progress Notes (Signed)
RT came to the bedside to suction patient. Patient inner cannula cleaned and placed back in trach. RT educated patient on importance of inner cannula. Patient stated he does not have resources outside of hospital (suctioning and trach care kits). Dr. Wendee Beavers made aware. Patient in no distress at this time. RT will continue to monitor patient.

## 2017-06-10 NOTE — ED Notes (Signed)
Bed: WA06 Expected date:  Expected time:  Means of arrival:  Comments: Cancer center-low sodium

## 2017-06-10 NOTE — ED Notes (Signed)
Per cancer center, states patient has a sodium of 115-states malnourished-needs evaluation and possible admission

## 2017-06-10 NOTE — Consult Note (Signed)
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  Subjective:  Lawrence Arias is a 57 y.o. male who is under my care for the diagnosis of head and neck squamous cell carcinoma previously treated with surgery followed by salvage radiation therapy due to a local regional recurrence, and recently diagnosed with metastatic disease involving bilateral lungs as well as cervical recurrence not amenable to additional radiation therapy or surgery.  Patient has been undergoing palliative systemic therapy with carboplatin and paclitaxel and has received 2 cycles of treatment.  He was in my clinic today for possible administration of the third cycle of systemic chemotherapy when he was found to have rapidly progressive hyponatremia as well as severe hypomagnesemia.  Patient is hyponatremic at baseline usually running in mid 120s, but lab work today demonstrated sodium of 115.  Magnesium was below 1.0.  With that, patient was referred to the emergency room for additional evaluation and admission.  Patient has been admitted and is currently undergoing additional assessment.  Patient's complaints include chronic cancer related pain, no active nausea.  Patient has been increasing in his fatigue and has been having trouble maintaining adequate oral intake.  PEG tube was previously removed due to discomfort and drainage around the tube.  Patient continues to complain of drainage around tube insertion site.   Objective: Vital signs in last 24 hours: Blood pressure (!) 132/99, pulse 86, temperature 98 F (36.7 C), temperature source Oral, resp. rate 16, SpO2 100 %.  Intake/Output from previous day: No intake/output data recorded.  Physical Exam:  Patient is alert, awake, oriented x3.  Cachectic, does not appear to be in acute pain or respiratory distress. HEENT: Significant asymmetry of the face and neck with  bulky mass and previous graft of the left neck.  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  Overall, bulk of the mass appears to be slightly reduced.  Cutaneous ulcerations on the lateral neck appeared to be improved compared to previous examination.  Tracheostomy in place with significant amount of secretions. Lungs: Poor bilateral lung aeration without dullness to percussion Cardiac: S1/S2, regular, no murmurs rubs or gallops Abdomen: Abdomen is flat, nondistended.  Former site of the PEG tube located in the epigastrium to the left of the midline with cutaneous irritation and erosion.  No active bleeding or drainage at this time Extremities: Significant muscle atrophy, no peripheral edema. Portacath: without erythema  Lab Results: Recent Labs    06/10/17 0832 06/10/17 1020  WBC 4.4 4.7  HGB 11.8* 10.4*  HCT 32.6* 28.6*  PLT 341 323    BMET Recent Labs    06/10/17 1020 06/10/17 1625  NA 114* 118*  K 3.7 3.5  CL 75* 80*  CO2 23 24  GLUCOSE 106* 119*  BUN 11 8  CREATININE 1.00 0.77  CALCIUM 9.7 9.5    No results found for: CEA1  Studies/Results: Dg Chest Port 1 View  Result Date: 06/10/2017 CLINICAL DATA:  Increased fatigue, history of tonsillar carcinoma with tracheostomy, throat pain EXAM: PORTABLE CHEST 1 VIEW COMPARISON:  CT chest of 03/25/2017 and chest x-ray of 05/21/2016 FINDINGS: The lungs remain clear and somewhat hyperaerated. No definite enlarging lung nodule is evident by chest x-ray. Right-sided Port-A-Cath is present with the tip overlying the lower SVC and tracheostomy is noted. Mediastinal and hilar contours are stable and heart size is within normal limits. Surgical clips overlie  the left axilla. Old healed right rib fractures are noted. IMPRESSION: 1. Hyperaeration.  No active lung disease. 2. Port-A-Cath tip overlies the lower SVC. 3. Tracheostomy is present. Electronically Signed   By: Ivar Drape M.D.   On: 06/10/2017 10:49     Medications: I have reviewed the patient's current medications.  Assessment/Plan: 57 y.o. 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  Currently undergoing palliative systemic chemotherapy with carboplatin and paclitaxel.  Has received 2 cycles of therapy.  Was planned for the third cycle and disease assessment subsequently, but was found to have progressive severe hyponatremia as well as hypomagnesemia.  Recommendations: -We will defer management of electrolyte abnormalities to the hospitalist service. - Consider obtaining gastroenterology or surgery consultation regarding the draining site of the previous PEG tube placement for concern of fistula formation. -Patient will not be receiving systemic chemotherapy while hospitalized. -If hospitalization proves to be prolonged, will consider obtaining inpatient imaging with CT of the neck and chest to assess for treatment response to help US guide goals of care conversation with the patient.   LOS: 0 days   Ardath Sax, MD   06/10/2017, 6:14 PM

## 2017-06-10 NOTE — ED Notes (Signed)
Date and time results received: 06/10/17 11:44 AM  Test: Sodium Critical Value: 114  Name of Provider Notified: Darl Householder

## 2017-06-10 NOTE — ED Notes (Signed)
Patient requesting trach suctioning. Respiratory made aware.

## 2017-06-10 NOTE — Telephone Encounter (Signed)
Printed patient avs and calender of upcoming appointment. Per 4/25 los

## 2017-06-11 ENCOUNTER — Inpatient Hospital Stay (HOSPITAL_COMMUNITY): Payer: Medicaid Other

## 2017-06-11 DIAGNOSIS — G893 Neoplasm related pain (acute) (chronic): Secondary | ICD-10-CM | POA: Diagnosis present

## 2017-06-11 DIAGNOSIS — E876 Hypokalemia: Secondary | ICD-10-CM | POA: Diagnosis not present

## 2017-06-11 DIAGNOSIS — E860 Dehydration: Secondary | ICD-10-CM | POA: Diagnosis present

## 2017-06-11 DIAGNOSIS — Z930 Tracheostomy status: Secondary | ICD-10-CM | POA: Diagnosis not present

## 2017-06-11 DIAGNOSIS — E871 Hypo-osmolality and hyponatremia: Principal | ICD-10-CM

## 2017-06-11 DIAGNOSIS — R109 Unspecified abdominal pain: Secondary | ICD-10-CM | POA: Diagnosis present

## 2017-06-11 DIAGNOSIS — C069 Malignant neoplasm of mouth, unspecified: Secondary | ICD-10-CM

## 2017-06-11 DIAGNOSIS — K219 Gastro-esophageal reflux disease without esophagitis: Secondary | ICD-10-CM | POA: Diagnosis present

## 2017-06-11 DIAGNOSIS — D6489 Other specified anemias: Secondary | ICD-10-CM | POA: Diagnosis present

## 2017-06-11 DIAGNOSIS — C7800 Secondary malignant neoplasm of unspecified lung: Secondary | ICD-10-CM | POA: Diagnosis present

## 2017-06-11 DIAGNOSIS — R140 Abdominal distension (gaseous): Secondary | ICD-10-CM

## 2017-06-11 DIAGNOSIS — Z79899 Other long term (current) drug therapy: Secondary | ICD-10-CM | POA: Diagnosis not present

## 2017-06-11 DIAGNOSIS — I10 Essential (primary) hypertension: Secondary | ICD-10-CM | POA: Diagnosis present

## 2017-06-11 DIAGNOSIS — Z923 Personal history of irradiation: Secondary | ICD-10-CM | POA: Diagnosis not present

## 2017-06-11 DIAGNOSIS — F17210 Nicotine dependence, cigarettes, uncomplicated: Secondary | ICD-10-CM | POA: Diagnosis present

## 2017-06-11 DIAGNOSIS — C140 Malignant neoplasm of pharynx, unspecified: Secondary | ICD-10-CM | POA: Diagnosis present

## 2017-06-11 DIAGNOSIS — Z8249 Family history of ischemic heart disease and other diseases of the circulatory system: Secondary | ICD-10-CM | POA: Diagnosis not present

## 2017-06-11 DIAGNOSIS — Z79891 Long term (current) use of opiate analgesic: Secondary | ICD-10-CM | POA: Diagnosis not present

## 2017-06-11 LAB — CBC
HEMATOCRIT: 25.6 % — AB (ref 39.0–52.0)
Hemoglobin: 9.4 g/dL — ABNORMAL LOW (ref 13.0–17.0)
MCH: 35.2 pg — ABNORMAL HIGH (ref 26.0–34.0)
MCHC: 36.7 g/dL — ABNORMAL HIGH (ref 30.0–36.0)
MCV: 95.9 fL (ref 78.0–100.0)
Platelets: 324 10*3/uL (ref 150–400)
RBC: 2.67 MIL/uL — ABNORMAL LOW (ref 4.22–5.81)
RDW: 13.2 % (ref 11.5–15.5)
WBC: 5.4 10*3/uL (ref 4.0–10.5)

## 2017-06-11 LAB — BASIC METABOLIC PANEL
Anion gap: 12 (ref 5–15)
Anion gap: 10 (ref 5–15)
Anion gap: 9 (ref 5–15)
BUN: 8 mg/dL (ref 6–20)
BUN: 6 mg/dL (ref 6–20)
BUN: 5 mg/dL — ABNORMAL LOW (ref 6–20)
CALCIUM: 8.1 mg/dL — AB (ref 8.9–10.3)
CO2: 26 mmol/L (ref 22–32)
CO2: 25 mmol/L (ref 22–32)
CO2: 24 mmol/L (ref 22–32)
CREATININE: 0.55 mg/dL — AB (ref 0.61–1.24)
Calcium: 9.3 mg/dL (ref 8.9–10.3)
Calcium: 9 mg/dL (ref 8.9–10.3)
Chloride: 82 mmol/L — ABNORMAL LOW (ref 101–111)
Chloride: 84 mmol/L — ABNORMAL LOW (ref 101–111)
Chloride: 90 mmol/L — ABNORMAL LOW (ref 101–111)
Creatinine, Ser: 0.75 mg/dL (ref 0.61–1.24)
Creatinine, Ser: 0.85 mg/dL (ref 0.61–1.24)
GFR calc non Af Amer: >60 mL/min (ref >60)
Glucose, Bld: 122 mg/dL — ABNORMAL HIGH (ref 65–99)
Glucose, Bld: 95 mg/dL (ref 65–99)
Glucose, Bld: 94 mg/dL (ref 65–99)
POTASSIUM: 3.6 mmol/L (ref 3.5–5.1)
POTASSIUM: 3.6 mmol/L (ref 3.5–5.1)
Potassium: 3.2 mmol/L — ABNORMAL LOW (ref 3.5–5.1)
SODIUM: 120 mmol/L — AB (ref 135–145)
SODIUM: 119 mmol/L — AB (ref 135–145)
SODIUM: 123 mmol/L — AB (ref 135–145)

## 2017-06-11 LAB — HIV ANTIBODY (ROUTINE TESTING W REFLEX): HIV Screen 4th Generation wRfx: NONREACTIVE

## 2017-06-11 LAB — OSMOLALITY, URINE: OSMOLALITY UR: 127 mosm/kg — AB (ref 300–900)

## 2017-06-11 LAB — OSMOLALITY: OSMOLALITY: 236 mosm/kg — AB (ref 275–295)

## 2017-06-11 LAB — MAGNESIUM: Magnesium: 1 mg/dL — ABNORMAL LOW (ref 1.7–2.4)

## 2017-06-11 MED ORDER — IOPAMIDOL (ISOVUE-300) INJECTION 61%
30.0000 mL | Freq: Once | INTRAVENOUS | Status: AC | PRN
  Administered 2017-06-11: 30 mL via ORAL

## 2017-06-11 MED ORDER — PROCHLORPERAZINE MALEATE 10 MG PO TABS: 10.0000 mg | ORAL_TABLET | Freq: Four times a day (QID) | ORAL | Status: DC | PRN

## 2017-06-11 MED ORDER — IOHEXOL 300 MG/ML  SOLN
75.0000 mL | Freq: Once | INTRAMUSCULAR | Status: AC | PRN
  Administered 2017-06-11: 75 mL via INTRAVENOUS

## 2017-06-11 MED ORDER — OXYCODONE HCL 5 MG PO TABS: 10.0000 mg | ORAL_TABLET | ORAL | Status: DC | PRN

## 2017-06-11 MED ORDER — GUAIFENESIN ER 600 MG PO TB12
600.0000 mg | ORAL_TABLET | Freq: Two times a day (BID) | ORAL | Status: DC
  Administered 2017-06-11 – 2017-06-14 (×7): 600 mg via ORAL
  Filled 2017-06-11 (×7): qty 1

## 2017-06-11 MED ORDER — MAGNESIUM SULFATE 2 GM/50ML IV SOLN
2.0000 g | Freq: Once | INTRAVENOUS | Status: AC
  Administered 2017-06-11: 2 g via INTRAVENOUS
  Filled 2017-06-11: qty 50

## 2017-06-11 MED ORDER — IOPAMIDOL (ISOVUE-300) INJECTION 61%
INTRAVENOUS | Status: AC
  Administered 2017-06-11: 30 mL
  Filled 2017-06-11: qty 30

## 2017-06-11 MED ORDER — POTASSIUM CHLORIDE 20 MEQ PO PACK
40.0000 meq | PACK | ORAL | Status: AC
  Administered 2017-06-11 (×2): 40 meq via ORAL
  Filled 2017-06-11 (×2): qty 2

## 2017-06-11 NOTE — Progress Notes (Signed)
PROGRESS NOTE    Lawrence Arias  BCW:[id number] DOB: 1960/12/20 DOA: 06/10/2017 PCP: System, Provider Not In    Brief Narrative:  57 year old male who presented abdominal pain and chronic anemia.  Patient does have significant past medical history of throat cancer.  Patient had recently his J-tube removed, significant drainage through the open stoma, serous fluid.  On physical examination blood pressure 115/96, heart rate 89, respiratory 20, oxygen saturation 98%.  Mucous membranes, neck with tracheostomy in place, lungs are clear to auscultation bilaterally, heart S1-S2 present, no gallops, rubs or murmurs, the abdomen was tender to palpation, at site where the PEG tube placed had been opening wound, with ulcerated skin surrounding the stoma, stage II with regular markings.  No lower extremity edema.  Sodium 118, potassium 3.5, chloride 80, bicarb 24, glucose 118, BUN 8, creatinine 0.77, white count 5.4, hemoglobin 9.4, hematocrit 25.6, platelets 324.   Patient was admitted to the hospital with a working diagnosis of worsening hyponatremia, due to dehydration.   Assessment & Plan:   Active Problems:   Oral cancer ([facility])   Hyponatremia   Hypomagnesemia   1. Hypo-osmolar hyponatremia. Na slowly improving, will continue saline infusion at 75 ml per hour, will follow on renal panel in am, Na today at 123, with K at 3,2 and serum bicarbonate at 24. Correct K with kcl.   2. Abdominal pain with drainage at the site of peg tube. Feeding tube has been removed 2 weeks ago, case discussed with radiology, will order CT with contrast. Consult wound care team. Continue analgesics and IV fluids.   3. Throat cancer. Will continue pain control, aspiration precautions.   4. HTN. Will continue blood pressure control with lisinopril.   DVT prophylaxis: scd  Code Status: full Family Communication: no family at the bedside  Disposition Plan: home when stable    Consultants:   Oncology    Procedures:     Antimicrobials:       Subjective: Patient with significant abdominal pain, positive drainage where peg tube was located, no nausea or vomiting, no chest pain or dyspnea.   Objective: Vitals:   06/11/17 0815 06/11/17 1100 06/11/17 1200 06/11/17 1517  BP:    123/84  Pulse:  90  93  Resp:  16  18  Temp:    97.8 F (36.6 C)  TempSrc:    Oral  SpO2: 91% 91%  96%  Weight:   44.6 kg (98 lb 4.8 oz)   Height:   5\' 7"  (1.702 m)     Intake/Output Summary (Last 24 hours) at 06/11/2017 1619 Last data filed at 06/10/2017 2048 Gross per 24 hour  Intake 198.75 ml  Output -  Net 198.75 ml   Filed Weights   06/10/17 1940 06/11/17 1200  Weight: 44.5 kg (98 lb 3.2 oz) 44.6 kg (98 lb 4.8 oz)    Examination:   General: deconditioned and ill looking appearing Neurology: Awake and alert, non focal  E ENT: mild pallor, no icterus, oral mucosa moist/ trach in place Cardiovascular: No JVD. S1-S2 present, rhythmic, no gallops, rubs, or murmurs. No lower extremity edema. Pulmonary: decreased breath sounds bilaterally, adequate air movement, no wheezing, rhonchi or rales. Gastrointestinal. Abdomen with, no organomegaly, no rebound or guarding, tender to palpation, positive drainage of fluid serous from abdominal stoma.  Skin. Stage 2 ulcerated lesion, at the site where peg tube was placed.  Musculoskeletal: no joint deformities     Data Reviewed: I have personally reviewed following labs and  imaging studies  CBC: Recent Labs  Lab 06/10/17 0832 06/10/17 1020 06/11/17 0535  WBC 4.4 4.7 5.4  NEUTROABS 3.3 3.4  --   HGB 11.8* 10.4* 9.4*  HCT 32.6* 28.6* 25.6*  MCV 96.4 95.3 95.9  PLT 341 323 767   Basic Metabolic Panel: Recent Labs  Lab 06/10/17 0832  06/10/17 1625 06/10/17 2025 06/11/17 0125 06/11/17 0535 06/11/17 1200  NA 115*   < > 118* 120* 120* 119* 123*  K 3.7   < > 3.5 3.6 3.6 3.6 3.2*  CL 73*   < > 80* 82* 82* 84* 90*  CO2 25   < > 24 26 26 25 24    GLUCOSE 118   < > 119* 110* 122* 95 94  BUN 10   < > 8 9 8 6  5*  CREATININE 1.25   < > 0.77 0.79 0.75 0.85 0.55*  CALCIUM 10.7*   < > 9.5 9.2 9.3 9.0 8.1*  MG 0.8*  --   --   --   --  1.0*  --    < > = values in this interval not displayed.   GFR: Estimated Creatinine Clearance: 63.5 mL/min (A) (by C-G formula based on SCr of 0.55 mg/dL (L)). Liver Function Tests: Recent Labs  Lab 06/10/17 0832  AST 23  ALT 16  ALKPHOS 94  BILITOT 0.5  PROT 8.2  ALBUMIN 3.8   No results for input(s): LIPASE, AMYLASE in the last 168 hours. No results for input(s): AMMONIA in the last 168 hours. Coagulation Profile: No results for input(s): INR, PROTIME in the last 168 hours. Cardiac Enzymes: No results for input(s): CKTOTAL, CKMB, CKMBINDEX, TROPONINI in the last 168 hours. BNP (last 3 results) No results for input(s): PROBNP in the last 8760 hours. HbA1C: No results for input(s): HGBA1C in the last 72 hours. CBG: No results for input(s): GLUCAP in the last 168 hours. Lipid Profile: No results for input(s): CHOL, HDL, LDLCALC, TRIG, CHOLHDL, LDLDIRECT in the last 72 hours. Thyroid Function Tests: No results for input(s): TSH, T4TOTAL, FREET4, T3FREE, THYROIDAB in the last 72 hours. Anemia Panel: No results for input(s): VITAMINB12, FOLATE, FERRITIN, TIBC, IRON, RETICCTPCT in the last 72 hours.    Radiology Studies: I have reviewed all of the imaging during this hospital visit personally     Scheduled Meds: . guaiFENesin  600 mg Oral BID  . heparin  5,000 Units Subcutaneous Q8H  . iopamidol      . lisinopril  20 mg Oral Daily  . magnesium oxide  400 mg Oral BID  . oxyCODONE  10 mg Oral Q12H   Continuous Infusions: . sodium chloride 75 mL/hr at 06/11/17 1155     LOS: 0 days        Mauricio Gerome Apley, MD [facility] Pager [phone]

## 2017-06-11 NOTE — Progress Notes (Signed)
CRITICAL VALUE STICKER  CRITICAL VALUE: [place] (on-site recipient of call): Melrose Nakayama  DATE & TIME NOTIFIED:  06/11/2017 0425  MESSENGER (representative from lab): Lab Tech from [facility]  MD NOTIFIED: Wendee Beavers on [phone]  TIME OF NOTIFICATION: 0430  RESPONSE: Awaiting respond

## 2017-06-11 NOTE — Progress Notes (Signed)
Patient removes his trach inner cannula frequently, advised not to do it.patient non compliant also removes his abdominal dressings ,and puts towel over it

## 2017-06-11 NOTE — Progress Notes (Addendum)
PROGRESS NOTE    YIANNI SKILLING  ION:[id number] DOB: [birth year] DOA: 06/10/2017 PCP: System, Provider Not In   Brief Narrative:  [name] is a 57 year old male who presents for hyponatremia as per his oncologist. 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 On routine follow up patient was found to have low sodium and was subsequently referred to the emergency department for further evaluation. As per his oncologist patient is chronically hyponatremic with baseline at 120, and upon evaluation sodium was at 115, magnesium below 1.0. The patient reports poor oral intake after recent removal of peg tube. He has been drinking Ensure to keep himself hydrated but has been having poor appetite. On admission temperature 97.8, blood pressure 123/98 mmHg, pulse 97, respirations 16. Sodium 114, potassium 3.7, Chloride 75, CO2 23, glucose 106, BUN 11, creatinine 1.00, calcium 9.7, anion gap 16, magnesium 0.8, osmolality 236, urine osmolality 127, urine sodium 15, urine creatinine 49.80, WBC 4.7, RBC 3.38, Hgb 11.8, platelets 341. UA negative for UTI. Chest Xray shows hyperaeration with no active lung disease.  The patient was admitted with a working diagnosis of hyponatremia due to dehydration.  Assessment & Plan:   Active Problems:   Oral cancer ([place])   Hyponatremia   Hypomagnesemia   Oral cancer -Continue outpatient follow up with oncologist after hospital discharge. -As per oncologist, patient will not be receiving chemo while admitted as inpatient.  -Oncologist also recommending gastroenterology or surgery consult regarding the draining site of the previous PEG tube placement for concern of fistula formation. -Previous peg tube site is draining clear fluid, skin is ulcerated and erythematous. -Consult placed for wound care team. -CT scan of abdomen with contrast ordered for evaluation of possible  fistula.   Hyponatremia -Likely due to poor oral intake, patients reports having decreased appetite. -Improved, today sodium 119. -Continue IV fluids.  Hypomagnesemia -Most likely due to poor oral intake. -Today magnesium at 1.0  -Replace with 2 grams of magnesium IV and magnesium oxide tablet 400 mg two times daily.   DVT prophylaxis: Heparin. Code Status: FULL. Family Communication: None at bedside. Disposition Plan: Home when clinically stable.   Consultants:   Oncology.  Procedures:  None.  Antimicrobials:   None.   Subjective: Patient reports having abdominal pain, cough, congestion and wheezing. Asking for something to clear up the chest congestion he has been having. Also notes having decreased appetite. Denies dizziness, lightheadedness, nausea, vomiting, diarrhea.   Objective: Vitals:   06/10/17 2237 06/11/17 0308 06/11/17 0511 06/11/17 0815  BP:   (!) 83/60   Pulse: (!) 102 81 78   Resp: 15 16 14    Temp:   98.2 F (36.8 C)   TempSrc:   Oral   SpO2: 96% 94% 96% 91%  Weight:        Intake/Output Summary (Last 24 hours) at 06/11/2017 1212 Last data filed at 06/10/2017 2048 Gross per 24 hour  Intake 198.75 ml  Output -  Net 198.75 ml   Filed Weights   06/10/17 1940  Weight: 44.5 kg (98 lb 3.2 oz)    Examination:  General exam: Appears calm and comfortable  Mouth: Mucous membranes, dry. Left sided throat enlargement. Respiratory system: Crackles and rhonchi bilaterally. Patient has tracheostomy.  Cardiovascular system: S1 & S2 heard, RRR. No murmurs, rubs, gallops or clicks. No pedal edema. Gastrointestinal system: Peg tube previously removed 2 weeks ago, site is draining fluid and erythematous.  Abdomen is nondistended, soft and mildly tender to palpation. No organomegaly or masses felt. Normal bowel sounds heard. Central nervous system: Alert and oriented. No focal neurological deficits. Extremities: Moves all four extremities. Skin: Previous  peg tube site in the LUQ is ulcerated and erythematous, draining clear fluid. Psychiatry: Judgement and insight appear normal. Mood & affect appropriate.    Data Reviewed: I have personally reviewed following labs and imaging studies  CBC: Recent Labs  Lab 06/10/17 0832 06/10/17 1020 06/11/17 0535  WBC 4.4 4.7 5.4  NEUTROABS 3.3 3.4  --   HGB 11.8* 10.4* 9.4*  HCT 32.6* 28.6* 25.6*  MCV 96.4 95.3 95.9  PLT 341 323 443   Basic Metabolic Panel: Recent Labs  Lab 06/10/17 0832 06/10/17 1020 06/10/17 1625 06/10/17 2025 06/11/17 0125 06/11/17 0535  NA 115* 114* 118* 120* 120* 119*  K 3.7 3.7 3.5 3.6 3.6 3.6  CL 73* 75* 80* 82* 82* 84*  CO2 25 23 24 26 26 25   GLUCOSE 118 106* 119* 110* 122* 95  BUN 10 11 8 9 8 6   CREATININE 1.25 1.00 0.77 0.79 0.75 0.85  CALCIUM 10.7* 9.7 9.5 9.2 9.3 9.0  MG 0.8*  --   --   --   --  1.0*   GFR: Estimated Creatinine Clearance: 59.6 mL/min (by C-G formula based on SCr of 0.85 mg/dL). Liver Function Tests: Recent Labs  Lab 06/10/17 0832  AST 23  ALT 16  ALKPHOS 94  BILITOT 0.5  PROT 8.2  ALBUMIN 3.8   No results for input(s): LIPASE, AMYLASE in the last 168 hours. No results for input(s): AMMONIA in the last 168 hours. Coagulation Profile: No results for input(s): INR, PROTIME in the last 168 hours. Cardiac Enzymes: No results for input(s): CKTOTAL, CKMB, CKMBINDEX, TROPONINI in the last 168 hours. BNP (last 3 results) No results for input(s): PROBNP in the last 8760 hours. HbA1C: No results for input(s): HGBA1C in the last 72 hours. CBG: No results for input(s): GLUCAP in the last 168 hours. Lipid Profile: No results for input(s): CHOL, HDL, LDLCALC, TRIG, CHOLHDL, LDLDIRECT in the last 72 hours. Thyroid Function Tests: No results for input(s): TSH, T4TOTAL, FREET4, T3FREE, THYROIDAB in the last 72 hours. Anemia Panel: No results for input(s): VITAMINB12, FOLATE, FERRITIN, TIBC, IRON, RETICCTPCT in the last 72 hours. Sepsis  Labs: No results for input(s): PROCALCITON, LATICACIDVEN in the last 168 hours.  No results found for this or any previous visit (from the past 240 hour(s)).       Radiology Studies: Dg Chest Port 1 View  Result Date: 06/10/2017 CLINICAL DATA:  Increased fatigue, history of tonsillar carcinoma with tracheostomy, throat pain EXAM: PORTABLE CHEST 1 VIEW COMPARISON:  CT chest of 03/25/2017 and chest x-ray of 05/21/2016 FINDINGS: The lungs remain clear and somewhat hyperaerated. No definite enlarging lung nodule is evident by chest x-ray. Right-sided Port-A-Cath is present with the tip overlying the lower SVC and tracheostomy is noted. Mediastinal and hilar contours are stable and heart size is within normal limits. Surgical clips overlie the left axilla. Old healed right rib fractures are noted. IMPRESSION: 1. Hyperaeration.  No active lung disease. 2. Port-A-Cath tip overlies the lower SVC. 3. Tracheostomy is present. Electronically Signed   By: Ivar Drape M.D.   On: 06/10/2017 10:49        Scheduled Meds: . heparin  5,000 Units Subcutaneous Q8H  . lisinopril  20 mg Oral Daily  . magnesium oxide  400 mg Oral BID  .  oxyCODONE  10 mg Oral Q12H   Continuous Infusions: . sodium chloride 75 mL/hr at 06/11/17 1155     LOS: 0 days    Time spent: 25 minutes.    Eloy End, PA-S [facility] Pager [phone]  If 7PM-7AM, please contact night-coverage [email or web address] Password [email or web address] 06/11/2017, 12:12 PM

## 2017-06-12 LAB — BASIC METABOLIC PANEL
Anion gap: 9 (ref 5–15)
BUN: <5 mg/dL — ABNORMAL LOW (ref 6–20)
CALCIUM: 8.5 mg/dL — AB (ref 8.9–10.3)
CHLORIDE: 95 mmol/L — AB (ref 101–111)
CO2: 23 mmol/L (ref 22–32)
CREATININE: 0.56 mg/dL — AB (ref 0.61–1.24)
GFR calc Af Amer: >60 mL/min (ref >60)
GFR calc non Af Amer: >60 mL/min (ref >60)
GLUCOSE: 97 mg/dL (ref 65–99)
Potassium: 4.1 mmol/L (ref 3.5–5.1)
Sodium: 127 mmol/L — ABNORMAL LOW (ref 135–145)

## 2017-06-12 LAB — OSMOLALITY, URINE: OSMOLALITY UR: 277 mosm/kg — AB (ref 300–900)

## 2017-06-12 LAB — SODIUM, URINE, RANDOM: SODIUM UR: 33 mmol/L

## 2017-06-12 LAB — MAGNESIUM: Magnesium: 1.2 mg/dL — ABNORMAL LOW (ref 1.7–2.4)

## 2017-06-12 MED ORDER — OXYCODONE HCL 5 MG PO TABS
10.0000 mg | ORAL_TABLET | ORAL | Status: DC | PRN
  Administered 2017-06-12 – 2017-06-13 (×2): 10 mg via ORAL
  Filled 2017-06-12 (×2): qty 2

## 2017-06-12 NOTE — Progress Notes (Signed)
Despite repeated attempts and education patient will not let this writer place him on humidity via trach collar.  Nursing is aware of this refusal.

## 2017-06-12 NOTE — Progress Notes (Addendum)
SATURATION QUALIFICATIONS: (This note is used to comply with regulatory documentation for home oxygen)  Patient Saturations on Room Air at Rest = 80%  Patient Saturations on Room Air while Ambulating = unable to tolerate without oxygen  Patient Saturations on 5 Liters/FiO2 28% trach collar of oxygen = 93%  Please briefly explain why patient needs home oxygen:

## 2017-06-12 NOTE — Discharge Summary (Signed)
Physician Discharge Summary  Lawrence Arias PXT:[id number] DOB: 15-Mar-1960 DOA: 06/10/2017  PCP: System, Provider Not In  Admit date: 06/10/2017 Discharge date: 06/12/2017  Admitted From: Home Disposition:  Home  Recommendations for Outpatient Follow-up and new medication changes:  1. Follow up with PCP in 1- week   Home Health: yes   Equipment/Devices: no   Discharge Condition: stable  CODE STATUS: full  Diet recommendation: Heart healthy  Brief/Interim Summary: 57 year old male who presented abdominal pain and chronic anemia.  Patient does have significant past medical history of throat cancer.  Patient had recently his J-tube removed, significant drainage through the open stoma, serous fluid.  On physical examination blood pressure 115/96, heart rate 89, respiratory 20, oxygen saturation 98%.  Mucous membranes, neck with tracheostomy in place, lungs are clear to auscultation bilaterally, heart S1-S2 present, no gallops, rubs or murmurs, the abdomen was tender to palpation, at site where the PEG tube placed had been opening wound, with ulcerated skin surrounding the stoma, stage II with regular markings.  No lower extremity edema.  Sodium 118, potassium 3.5, chloride 80, bicarb 24, glucose 118, BUN 8, creatinine 0.77, white count 5.4, hemoglobin 9.4, hematocrit 25.6, platelets 324.   Patient was admitted to the hospital with a working diagnosis of worsening hyponatremia, due to dehydration.   1.  Hypoosmolar hyponatremia due to dehydration.  Patient was admitted to the medical unit, he was placed on intra-venous normal saline, with improvement of his electrolytes and hydration status.  Patient able to tolerate po diet adequately.  Discharge sodium 123, potassium 3.2, BUN 5, creatinine 0.55.  Home health services have been arranged for outpatient follow-up.  2.  Abdominal pain with drainage at the site of PEG tube.  Patient had his PEG tube removed about 2 weeks ago with continued  serous drainage, per patient significant drainage.  CT of the abdomen performed, shows small amount of air in the tract but no discrete abscess.  No leaking oral contrast was identified.  3.  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  Patient will follow-up as an outpatient with the oncology clinic.   4. HTN. Continue lisinopril for blood pressure control.    Discharge Diagnoses:  Active Problems:   Oral cancer ([place])   Hyponatremia   Hypomagnesemia    Discharge Instructions   Allergies as of 06/12/2017   No Known Allergies     Medication List    STOP taking these medications   buPROPion 150 MG 12 hr tablet Commonly known as:  WELLBUTRIN SR   gabapentin 300 MG capsule Commonly known as:  NEURONTIN   lidocaine 2 % solution Commonly known as:  XYLOCAINE   nicotine 14 mg/24hr patch Commonly known as:  NICODERM CQ - dosed in mg/24 hours   nicotine 7 mg/24hr patch Commonly known as:  NICODERM CQ - dosed in mg/24 hr   oxyCODONE 10 mg 12 hr tablet Commonly known as:  OXYCONTIN   Oxycodone HCl 10 MG Tabs   potassium chloride 10 MEQ tablet Commonly known as:  K-DUR     TAKE these medications   acetaminophen 500 MG tablet Commonly known as:  TYLENOL Take 1,000 mg by mouth every 6 (six) hours as needed for moderate pain.   lisinopril 20 MG tablet Commonly known as:  PRINIVIL,ZESTRIL Take 20 mg by mouth daily.   magnesium oxide 400 (241.3 Mg) MG tablet Commonly known as:  MAG-OX Take 1 tablet (400 mg total) by mouth 2 (two) times daily.  ondansetron 8 MG tablet Commonly known as:  ZOFRAN Take 1 tablet (8 mg total) by mouth every 8 (eight) hours as needed for nausea.   polyethylene glycol powder powder Commonly known as:  MIRALAX Use as directed for constipation. Follow directions on container.   prochlorperazine 10 MG tablet Commonly known as:  COMPAZINE Take 1 tablet (10 mg total) by mouth every 6 (six) hours as needed for nausea or  vomiting.       No Known Allergies  Consultations:  Oncology    Procedures/Studies: Ct Abdomen W Contrast  Result Date: 06/11/2017 CLINICAL DATA:  Abdominal pain and chronic anemia. Remote history of throat cancer. Some pain and swelling around the G-tube site. EXAM: CT ABDOMEN WITH CONTRAST TECHNIQUE: Multidetector CT imaging of the abdomen was performed using the standard protocol following bolus administration of intravenous contrast. CONTRAST:  3mL OMNIPAQUE IOHEXOL 300 MG/ML SOLN, 43mL ISOVUE-300 IOPAMIDOL (ISOVUE-300) INJECTION 61% COMPARISON:  None. FINDINGS: Lower chest: Small bilateral pleural effusions with minimal overlying atelectasis. There is fairly marked elevation of the left hemidiaphragm. No pericardial effusion. Hepatobiliary: No focal hepatic lesions or intrahepatic biliary dilatation. The gallbladder appears normal. Mild common bile duct dilatation. The common bile duct and head of the pancreas measures 7.3 mm. It tapers normally to the ampulla. Pancreas: No mass, inflammation or ductal dilatation. Spleen: Normal size.  No focal lesions. Adrenals/Urinary Tract: The adrenal glands and kidneys are unremarkable. Stomach/Bowel: The stomach, duodenum, visualized small bowel loops and visualized colon are unremarkable. The gastrostomy tube has been removed. Slight thickening of the left rectus muscle and a small amount of air in the G-tube tract. No discrete abscess is identified. Mild gastric wall thickening in this area also. No obstruction or leaking oral contrast. Vascular/Lymphatic: Scattered atherosclerotic changes involving the aorta. The branch vessels are patent. The major venous structures are patent. No mesenteric or retroperitoneal mass or adenopathy. Other: No ascites or abdominal wall hernia. Musculoskeletal: No significant bony findings. IMPRESSION: 1. The feeding gastrostomy tube has been removed. There is a small amount of air in the tract but no discrete abscess. Mild  thickening of the left rectus muscle and also the stomach wall. No leaking oral contrast is identified. 2. No acute abdominal findings, mass lesions or adenopathy. 3. Small bilateral pleural effusions. Electronically Signed   By: Marijo Sanes M.D.   On: 06/11/2017 20:23   Dg Chest Port 1 View  Result Date: 06/10/2017 CLINICAL DATA:  Increased fatigue, history of tonsillar carcinoma with tracheostomy, throat pain EXAM: PORTABLE CHEST 1 VIEW COMPARISON:  CT chest of 03/25/2017 and chest x-ray of 05/21/2016 FINDINGS: The lungs remain clear and somewhat hyperaerated. No definite enlarging lung nodule is evident by chest x-ray. Right-sided Port-A-Cath is present with the tip overlying the lower SVC and tracheostomy is noted. Mediastinal and hilar contours are stable and heart size is within normal limits. Surgical clips overlie the left axilla. Old healed right rib fractures are noted. IMPRESSION: 1. Hyperaeration.  No active lung disease. 2. Port-A-Cath tip overlies the lower SVC. 3. Tracheostomy is present. Electronically Signed   By: Ivar Drape M.D.   On: 06/10/2017 10:49       Subjective: Patient is feeling better, no nausea or vomiting, improved abdominal pain, no nausea or vomiting.   Discharge Exam: Vitals:   06/12/17 0300 06/12/17 0656  BP:  (!) 120/92  Pulse: 80 (!) 101  Resp: 16 16  Temp:  98.2 F (36.8 C)  SpO2: 94% 92%   Vitals:  06/11/17 2219 06/11/17 2355 06/12/17 0300 06/12/17 0656  BP: 110/80   (!) 120/92  Pulse: 92 82 80 (!) 101  Resp: 16 16 16 16   Temp: 97.9 F (36.6 C)   98.2 F (36.8 C)  TempSrc: Oral   Oral  SpO2: 98% 94% 94% 92%  Weight:      Height:        General: Not in pain or dyspnea.  Neurology: Awake and alert, non focal  E ENT: no pallor, no icterus, oral mucosa moist. Trach in place.  Cardiovascular: No JVD. S1-S2 present, rhythmic, no gallops, rubs, or murmurs. No lower extremity edema. Pulmonary: vesicular breath sounds bilaterally, adequate air  movement, no wheezing, rhonchi or rales. Gastrointestinal. Abdomen with no organomegaly, non tender, no rebound or guarding. Dressing in place at the site of where peg tube was located.  Skin. No rashes Musculoskeletal: no joint deformities   The results of significant diagnostics from this hospitalization (including imaging, microbiology, ancillary and laboratory) are listed below for reference.     Microbiology: No results found for this or any previous visit (from the past 240 hour(s)).   Labs: BNP (last 3 results) No results for input(s): BNP in the last 8760 hours. Basic Metabolic Panel: Recent Labs  Lab 06/10/17 0832  06/10/17 1625 06/10/17 2025 06/11/17 0125 06/11/17 0535 06/11/17 1200  NA 115*   < > 118* 120* 120* 119* 123*  K 3.7   < > 3.5 3.6 3.6 3.6 3.2*  CL 73*   < > 80* 82* 82* 84* 90*  CO2 25   < > 24 26 26 25 24   GLUCOSE 118   < > 119* 110* 122* 95 94  BUN 10   < > 8 9 8 6  5*  CREATININE 1.25   < > 0.77 0.79 0.75 0.85 0.55*  CALCIUM 10.7*   < > 9.5 9.2 9.3 9.0 8.1*  MG 0.8*  --   --   --   --  1.0*  --    < > = values in this interval not displayed.   Liver Function Tests: Recent Labs  Lab 06/10/17 0832  AST 23  ALT 16  ALKPHOS 94  BILITOT 0.5  PROT 8.2  ALBUMIN 3.8   No results for input(s): LIPASE, AMYLASE in the last 168 hours. No results for input(s): AMMONIA in the last 168 hours. CBC: Recent Labs  Lab 06/10/17 0832 06/10/17 1020 06/11/17 0535  WBC 4.4 4.7 5.4  NEUTROABS 3.3 3.4  --   HGB 11.8* 10.4* 9.4*  HCT 32.6* 28.6* 25.6*  MCV 96.4 95.3 95.9  PLT 341 323 324   Cardiac Enzymes: No results for input(s): CKTOTAL, CKMB, CKMBINDEX, TROPONINI in the last 168 hours. BNP: Invalid input(s): POCBNP CBG: No results for input(s): GLUCAP in the last 168 hours. D-Dimer No results for input(s): DDIMER in the last 72 hours. Hgb A1c No results for input(s): HGBA1C in the last 72 hours. Lipid Profile No results for input(s): CHOL, HDL,  LDLCALC, TRIG, CHOLHDL, LDLDIRECT in the last 72 hours. Thyroid function studies No results for input(s): TSH, T4TOTAL, T3FREE, THYROIDAB in the last 72 hours.  Invalid input(s): FREET3 Anemia work up No results for input(s): VITAMINB12, FOLATE, FERRITIN, TIBC, IRON, RETICCTPCT in the last 72 hours. Urinalysis    Component Value Date/Time   COLORURINE STRAW (A) 06/10/2017 1209   APPEARANCEUR CLEAR 06/10/2017 1209   LABSPEC 1.003 (L) 06/10/2017 1209   PHURINE 5.0 06/10/2017 1209   GLUCOSEU NEGATIVE  06/10/2017 1209   [place] 06/10/2017 1209   BILIRUBINUR NEGATIVE 06/10/2017 1209   KETONESUR 20 (A) 06/10/2017 1209   PROTEINUR NEGATIVE 06/10/2017 1209   UROBILINOGEN 1.0 07/01/2012 2214   NITRITE NEGATIVE 06/10/2017 1209   LEUKOCYTESUR NEGATIVE 06/10/2017 1209   Sepsis Labs Invalid input(s): PROCALCITONIN,  WBC,  LACTICIDVEN Microbiology No results found for this or any previous visit (from the past 240 hour(s)).   Time coordinating discharge: 45 minutes  SIGNED:   Tawni Millers, MD  [facility] 06/12/2017, 10:45 AM Pager [phone]  If 7PM-7AM, please contact night-coverage [email or web address] Password [email or web address]

## 2017-06-12 NOTE — Care Management Note (Addendum)
Case Management Note  Patient Details  Name: [name] MRN: [id number] Date of Birth: January 26, 1961  Subjective/Objective:      Neck and throat cancer, hyponatremia              Action/Plan: NCM spoke to pt and states he lives at home with his father, Mr Bennie [place]ir[place] # [phone]. States he does not have oxygen or trach supplies at home. States when he was dc from [facility], they did not arrange any trach supplies. Contacted [facility] for trach supplies, oxygen, RW with seat, and 3n1 bedside commode. [facility] states pt and family member will need training on trach care. States 24 hour notice is needed to arrange trach care with [facility]. [facility] rep with meet with [facility] and family on 06/13/2017 to do training for Trach care at home. [facility] will be able see pt in home on 06/14/2017.   Verified address. [facility] will arrange to have DME delivered to home today.   Expected Discharge Date:  06/12/17               Expected Discharge Plan:  [place]  In-House Referral:  NA  Discharge planning Services  CM Consult  Post Acute Care Choice:  Home Health Choice offered to:  Patient  DME Arranged:  Walker rolling with seat, Oxygen, Trach supplies DME Agency:  [place]:  Home Health RN, Respiratory Therapy HH Agency:  [facility]  Status of Service:  Completed, signed off  If discussed at [place] of Stay Meetings, dates discussed:    Additional Comments:  Erenest Rasher, RN 06/12/2017, 2:48 PM

## 2017-06-13 MED ORDER — MAGNESIUM SULFATE 2 GM/50ML IV SOLN
2.0000 g | Freq: Once | INTRAVENOUS | Status: AC
  Administered 2017-06-13: 2 g via INTRAVENOUS
  Filled 2017-06-13: qty 50

## 2017-06-13 NOTE — Progress Notes (Signed)
[facility] Follow-up Visit:  Assessment: Squamous cell cancer of retromolar trigone ([place]) 57 y.o.  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  Patient was supposed to be on systemic chemotherapy, but did not follow-up with any of the scheduled appointments in the beginning of the treatment necessitating discontinuation of the systemic therapy plan.  Later in the course of radiation, patient returned requesting initiation of systemic therapy, but my opinion, the benefit was minimal at that point and I did not proceed with any systemic treatment considering patient had less than a week left of radiation at that time.  Restaging imaging of neck and chest is suggestive of possible disease recurrence. Patient presents to the clinic today with complaints of oral and throat pain.  Examination confirms progressive swelling and likely progressive malignancy in the neck based on evaluation and palpation.  Presently, he is potential follow-up at [facility] and scheduled on 05/12/17 which is likely too far removed due to visibly progressive malignancy.  Patient is currently undergoing palliative-intent systemic chemotherapy with carboplatin and paclitaxel administered every 3 weeks.  Today, lab work demonstrates severe hypomagnesemia and hyponatremia with sodium of 115.  No acute mental status changes.  Patient is likely unable to maintain himself on enteral nutrition or hydration adequately at this point in time.    Plan: - Refer patient to the emergency room for urgent assessment with likely subsequent admission to the hospital for evaluation and management.  Please see my consultation note for details.  Voice recognition software was used and creation of this note. Despite my best effort at editing the text, some misspelling/errors may have occurred.  Orders Placed This Encounter  Procedures  . CT Soft  Tissue Neck W Contrast    Standing Status:   Future    Standing Expiration Date:   06/10/2018    Order Specific Question:   If indicated for the ordered procedure, I authorize the administration of contrast media per Radiology protocol    Answer:   Yes    Order Specific Question:   Preferred imaging location?    Answer:   [facility]    Order Specific Question:   Radiology Contrast Protocol - do NOT remove file path    Answer:   _0 [email or web address]    Order Specific Question:   Reason for Exam additional comments    Answer:   Head and neck cancer on chemotherapy -- please comapred to the last scan for disease response  . CT Chest W Contrast    Standing Status:   Future    Standing Expiration Date:   06/10/2018    Order Specific Question:   If indicated for the ordered procedure, I authorize the administration of contrast media per Radiology protocol    Answer:   Yes    Order Specific Question:   Preferred imaging location?    Answer:   [facility]    Order Specific Question:   Radiology Contrast Protocol - do NOT remove file path    Answer:   _1 [email or web address]    Order Specific Question:   Reason for Exam additional comments    Answer:   Head and neck cancer on chemotherapy -- please comapred to the last scan for disease response  . CBC with Differential (Cancer Center Only)    Standing Status:   Future    Standing Expiration Date:   06/11/2018  . CMP ([facility] only)  Standing Status:   Future    Standing Expiration Date:   06/11/2018  . Magnesium    Standing Status:   Future    Standing Expiration Date:   06/10/2018  . Phosphorus    Standing Status:   Future    Standing Expiration Date:   06/10/2018    Cancer Staging Oral cancer [facility]) Staging form: Oral Cavity, AJCC 8th Edition - Clinical: Stage III (cT3, cN1, cM0) - Signed by Eppie Gibson, MD on 08/25/2016  Squamous cell cancer of retromolar trigone  ([place]) Staging form: Oral Cavity, AJCC 8th Edition - Pathologic stage from 10/16/2016: Stage IVB (pT2, pN3b, cM0) - Unsigned   All questions were answered.  . The patient knows to call the clinic with any problems, questions or concerns.  This note was electronically signed.    History of Presenting Illness Lawrence Arias is a 57 y.o. followed in the [place] for diagnosis of squamous cell carcinoma of the left retromolar trigone. Since the diagnosis in Jul 2018, patient underwent surgery on 10/16/16 including graft tissue placement. Recovered well from the operation, and he is referred to medical oncology for possible systemic chemotherapy as an adjunctive for the adjuvant radiation therapy. His primary ENT is Dr. Janace Hoard and Radiation Oncologist is Dr Isidore Moos.  Due to concerns for residual/recurrent disease in the surgical bed, patient was recommended to undergo chemoradiotherapy and was planned for the treatment in mid October 2018.  Nevertheless, he did not show up for several appointments with our clinic and eventually we made decision to forego chemotherapy.  Patient is nearing completion of his radiation therapy and tolerated it quite well so far.   Patient returns to the clinic for toxicity monitoring while receiving systemic palliative systemic therapy with carboplatin and paclitaxel.  Interim, patient reports progressive and rapid weight loss, poor energy, difficulties continuing to provide himself with nutrition and hydration  by mouth.  Oncological/hematological History:   Squamous cell cancer of retromolar trigone ([place])   07/24/2016 Initial Diagnosis    Squamous cell cancer of retromolar trigone [facility]): Initial biopsy demonstrating moderately differentiated invasive squamous cell carcinoma.       08/14/2016 Imaging    CT neck: Nonvisualization of left retromolar trigone mucosal lesion. No evidence of invasion into the adjacent structures. 2. Left level IIa lymph node  measuring 1 cm, likely indicating spread of disease. No contralateral adenopathy.      09/29/2016 Imaging    CT Neck [facility]): Left-sided pathological lymphadenopathy of metastatic disease at level IB, IIA. Possible involvement of level IA, level III, and level IV on the left side.      10/16/2016 Surgery    Dr Harriett Rush Sunny [facility] [place]  [facility]): LEFT TONSIL, SOFT PALATE, FLOOR OF MOUTH, BASE OF TONGUE, AND MANDIBLE, PARTIAL GLOSSOPHARYNGECTOMY AND PARTIAL MANDIBULECTOMY, LEFT PARAPHARYNGEAL MASS, EXCISION, TEETH, REMOVAL       10/16/2016 Pathology Results    Surgical Pathology:  LEFT TONSIL, SOFT PALATE, FLOOR OF MOUTH, BASE OF TONGUE, AND MANDIBLE, PARTIAL GLOSSOPHARYNGECTOMY AND PARTIAL MANDIBULECTOMY: Invasive squamous cell carcinoma, keratinizing type, moderately differentiated, is identified forming a 3.7x1.8x1.0cm mass in the left retromolar area.Tumor invades to a depth of 0.8 cm.Lymphovascular invasion is identified. Perineural invasion is identified. The medial soft tissue margin is positive for invasive squamous cell carcinoma (focal). LEFT PARAPHARYNGEAL MASS, EXCISION: Invasive squamous cell carcinoma forming a 1.2x1.2x0.5cm mass. Invasive carcinoma involves the unoriented soft tissue margin over a linear length of 0.3 cm. TEETH, REMOVAL: Two teeth (gross examination only).  Margins: "TUMOR NEXT TO BONE", EXCISION: Scant fragments of bone and dense fibrous tissue, negative for carcinoma. "PHARYNGEAL WALL MARGIN", EXCISION: Squamous mucosa with acute inflammation and skeletal muscle, negative for carcinoma. "HARD PALATE MARGIN", EXCISION: Squamous mucosa, negative for carcinoma. "TONGUE BASE MARGIN", EXCISION: Squamous mucosa with chronic inflammation, negative for carcinoma. "INFERIOR BUCCAL MUCOSA MARGIN", EXCISION: Squamous mucosa and skeletal muscle, negative for carcinoma. "SUPERIOR BUCCAL MUCOSAL MARGIN", EXCISION: Squamous mucosa with chronic inflammation, negative for  carcinoma.  Lymph nodes:  LEVEL 1A NECK CONTENTS, LYMPH NODE DISSECTION: Three lymph nodes, negative for metastatic carcinoma (0/3). LEFT LEVEL 1B NECK CONTENTS, LYMPH NODE DISSECTION: Metastatic carcinoma in two of five lymph nodes (2/5). Largest metastatic deposit measures 2.4 cm. Definitive extranodal extension is not identified. Salivary gland parenchyma, negative for tumor. LEFT LEVEL 2B NECK CONTENTS, LYMPH NODE DISSECTION: Eight lymph nodes, negative for metastatic carcinoma (0/8). LEFT LEVEL 2A, 3, 4, AND 5 NECK CONTENTS, LYMPH NODE DISSECTION: Metastatic carcinoma in one of twenty-two lymph nodes (1/22). Largest metastatic deposit measures 3.5 cm. Extranodal extension is present and measures 1.5 mm beyond the lymph node capsule.  COMMENT: Immunohistochemical staining for p16 (Block A4) shows the invasive carcinoma is diffusely positive for p16.The positive control stains appropriately.      11/04/2016 Imaging    CT Neck [facility]): No pathological lymphadenopathy. Surgical changes noted. Significant fluid collection in the left upper neck with internal gas and significant soft tissue swelling.      11/30/2016 Imaging    CT Neck: Postop resection of retromolar trigone carcinoma on the left with free flap. Postsurgical changes left neck without evidence of recurrent tumor.   CT Chest: No suspicious pulmonary nodules or masses identified. No thoracic adenopathy.      12/31/2016 - 02/23/2017 Radiation Therapy    Left oral cavity and regional nodes: --66Gy/33 fx        03/25/2017 Relapse/Recurrence    CT Neck & Chest: Likely recurrent disease in the neck with left submandibular mass measuring 3.0 cm.  Multiple new small pulmonary nodules worrisome for possible metastatic disease.      04/30/2017 -  Chemotherapy    Palliative systemic chemotherapy: Carboplatin AUC 6, d1 + Paclitaxel 239m/m2, d1 Q21d --Cycle #1, 04/30/17: --Cycle #2, 054/65/68 Complicated by progressive  hyponatremia (Grade 4), hypomagnesemia (Grade 3) --Cycle #3, ...: Delayed due to complications of the previous cycle.        Medical History: Past Medical History:  Diagnosis Date  . Cancer ([place]   . GERD (gastroesophageal reflux disease)    uses OTC prn  . History of radiation therapy 12/31/16- 02/23/2017   Left oral cavity and regional nodes, 66 Gy in 33 fractions.   . Hypertension   . Tongue lesion     Surgical History: Past Surgical History:  Procedure Laterality Date  . DIRECT LARYNGOSCOPY N/A 07/24/2016   Procedure: DIRECT LARYNGOSCOPY;  Surgeon: BMelissa Montane MD;  [place]  Service: ENT;  Laterality: N/A;  Direct laryngoscopy with biopsy tongue lesion  . FRACTURE SURGERY     left wrist, remote  . FREE FLAP RADIAL FOREARM Left 10/16/2016   [facility] . free flap scapular Left 10/16/2016   [facility]  . GASTROSTOMY  10/27/2016   [facility]   . IR FLUORO GUIDE PORT INSERTION RIGHT  12/22/2016  . IR [place]BE PERCUT W/FLUORO  06/18/2017  . IR [place] VASC ACCESS  RIGHT  12/22/2016  . NECK DISSECTION Bilateral 10/16/2016   Selective neck dissection, Dr. Hendricks Limes [facility]  . 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 Hendricks Limes [facility]  . SKIN GRAFT Left 10/16/2016   Split thickness skin graft, leg Dr. Hendricks Limes. [facility]  . TONGUE BIOPSY  07/24/2016   Procedure: TONGUE BIOPSY;  Surgeon: Melissa Montane, MD;  Location: [facility];  Service: ENT;;  . TRACHEOSTOMY  10/16/2016   Dr. Hendricks Limes [facility]  . TRACHEOSTOMY TUBE PLACEMENT N/A 04/29/2017   Procedure: TRACHEOSTOMY REVISION;  Surgeon: Jodi Marble, MD;  [facility];  Service: ENT;  Laterality: N/A;    Family History: Family History  Problem Relation Age of Onset  . Hypertension Mother   . Hypertension Father     Social History: Social History   Socioeconomic History  .  Marital status: Single    Spouse name: Not on file  . Number of children: 3  . Years of education: Not on file  . Highest education level: Not on file  Occupational History  . Not on file  Social Needs  . Financial resource strain: Not on file  . Food insecurity:    Worry: Not on file    Inability: Not on file  . Transportation needs:    Medical: Not on file    Non-medical: Not on file  Tobacco Use  . Smoking status: Current Some Day Smoker    Packs/day: 0.50    Types: Cigarettes  . Smokeless tobacco: Never Used  . Tobacco comment: he is smoking 0ne every other day (11/20/16)  Substance and Sexual Activity  . Alcohol use: Not on file  . Drug use: No  . Sexual activity: Not on file  Lifestyle  . Physical activity:    Days per week: Not on file    Minutes per session: Not on file  . Stress: Not on file  Relationships  . Social connections:    Talks on phone: Not on file    Gets together: Not on file    Attends religious service: Not on file    Active member of club or organization: Not on file    Attends meetings of clubs or organizations: Not on file    Relationship status: Not on file  . Intimate partner violence:    Fear of current or ex partner: Not on file    Emotionally abused: Not on file    Physically abused: Not on file    Forced sexual activity: Not on file  Other Topics Concern  . Not on file  Social History Narrative  . Not on file    Allergies: No Known Allergies  Medications:  Current Outpatient Medications  Medication Sig Dispense Refill  . acetaminophen (TYLENOL) 500 MG tablet Take 1,000 mg by mouth every 6 (six) hours as needed for moderate pain.    . magnesium oxide (MAG-OX) 400 (241.3 Mg) MG tablet Take 1 tablet (400 mg total) by mouth 2 (two) times daily. 60 tablet 0  . ondansetron (ZOFRAN) 8 MG tablet Take 1 tablet (8 mg total) by mouth every 8 (eight) hours as needed for nausea. 30 tablet 0  . polyethylene glycol powder (MIRALAX) powder Use  as directed for constipation. Follow directions on container. 850 g 3  . fluconazole (DIFLUCAN) 100 MG tablet Take 2 tablets today, then 1 tablet daily x 6 more days. 8 tablet 0  . lisinopril (PRINIVIL,ZESTRIL) 20 MG tablet Take 20 mg by mouth daily.  0  . oxyCODONE (OXYCONTIN) 10 mg 12 hr tablet Take 1 tablet (10 mg total) by mouth every 12 (twelve) hours for 14 days. 28 tablet 0  . oxyCODONE 10 MG TABS Take 1 tablet (10 mg total) by mouth every 4 (four) hours as needed for up to 14 days for severe pain. 50 tablet 0   No current facility-administered medications for this visit.    Facility-Administered Medications Ordered in Other Visits  Medication Dose Route Frequency Provider Last Rate Last Dose  . alteplase (CATHFLO ACTIVASE) injection 2 mg  2 mg Intracatheter Once PRN , Marinell Blight, MD      . heparin lock flush 100 unit/mL  250 Units Intracatheter PRN ,  G, MD      . heparin lock flush 100 unit/mL  500 Units Intracatheter PRN , Marinell Blight, MD      . sodium chloride flush (NS) 0.9 % injection 10 mL  10 mL Intracatheter PRN Lebron Conners, Marinell Blight, MD        Review of Systems: Review of Systems  Constitutional: Positive for fatigue.  HENT:   Positive for lump/mass, sore throat and trouble swallowing.   Respiratory: Positive for shortness of breath. Negative for hemoptysis.   All other systems reviewed and are negative.    PHYSICAL EXAMINATION Blood pressure 109/79, pulse (!) 105, temperature 97.7 F (36.5 C), temperature source Oral, resp. rate 17, height 5' 7" (1.702 m), weight 101 lb 11.2 oz (46.1 kg), SpO2 97 %.  ECOG PERFORMANCE STATUS: 2 - Symptomatic, <50% confined to bed  Physical Exam  Constitutional: He is oriented to person, place, and time and well-developed, well-nourished, and in no distress. No distress.  HENT:  Mouth/Throat: No oropharyngeal exudate.  Stable left facial and cervical swelling with progressive palpable masses in the left neck  including several dermal lesions including one with ulceration.  Tracheostomy in place with thick nonpurulent secretions.  Eyes: Pupils are equal, round, and reactive to light. EOM are normal. No scleral icterus.  Neck: Normal range of motion. Neck supple. No thyromegaly present.  Cardiovascular: Normal rate, regular rhythm and intact distal pulses.  No murmur heard. Pulmonary/Chest: Effort normal and breath sounds normal. No respiratory distress. He has no wheezes. He has no rales.  Abdominal: Soft. Bowel sounds are normal. He exhibits no distension and no mass. There is no tenderness. There is no rebound and no guarding.  Musculoskeletal: Normal range of motion. He exhibits no edema.  Lymphadenopathy:    He has no cervical adenopathy.  Neurological: He is alert and oriented to person, place, and time. He has normal reflexes. No cranial nerve deficit. Coordination normal.  Skin: Skin is dry. No rash noted. He is not diaphoretic. No erythema. No pallor.     LABORATORY DATA: I have personally reviewed the data as listed: Admission on 06/10/2017, Discharged on 06/14/2017  Component Date Value Ref Range Status  . WBC 06/10/2017 4.7  4.0 - 10.5 K/uL Final  . RBC 06/10/2017 3.00* 4.22 - 5.81 MIL/uL Final  . Hemoglobin 06/10/2017 10.4* 13.0 - 17.0 g/dL Final  . HCT 06/10/2017 28.6* 39.0 - 52.0 % Final  . MCV 06/10/2017 95.3  78.0 - 100.0 fL Final  . MCH 06/10/2017 34.7* 26.0 - 34.0 pg Final  . MCHC 06/10/2017 36.4* 30.0 - 36.0 g/dL Final  . RDW 06/10/2017 13.1  11.5 - 15.5 % Final  . Platelets 06/10/2017 323  150 - 400 K/uL Final  . Neutrophils Relative % 06/10/2017 74  %  Final  . Neutro Abs 06/10/2017 3.4  1.7 - 7.7 K/uL Final  . Lymphocytes Relative 06/10/2017 12  % Final  . Lymphs Abs 06/10/2017 0.6* 0.7 - 4.0 K/uL Final  . Monocytes Relative 06/10/2017 14  % Final  . Monocytes Absolute 06/10/2017 0.7  0.1 - 1.0 K/uL Final  . Eosinophils Relative 06/10/2017 0  % Final  . Eosinophils  Absolute 06/10/2017 0.0  0.0 - 0.7 K/uL Final  . Basophils Relative 06/10/2017 0  % Final  . Basophils Absolute 06/10/2017 0.0  0.0 - 0.1 K/uL Final   Performed at [facility], [place] [address], [place], [place] [address]  . Sodium 06/10/2017 114* 135 - 145 mmol/L Final   Comment: CRITICAL RESULT CALLED TO, READ BACK BY AND VERIFIED WITH: SPENCER RN AT 1144 06/10/17 TIBBITTS,K   . Potassium 06/10/2017 3.7  3.5 - 5.1 mmol/L Final  . Chloride 06/10/2017 75* 101 - 111 mmol/L Final  . CO2 06/10/2017 23  22 - 32 mmol/L Final  . Glucose, Bld 06/10/2017 106* 65 - 99 mg/dL Final  . BUN 06/10/2017 11  6 - 20 mg/dL Final  . Creatinine, Ser 06/10/2017 1.00  0.61 - 1.24 mg/dL Final  . Calcium 06/10/2017 9.7  8.9 - 10.3 mg/dL Final  . GFR calc non Af Amer 06/10/2017 >60  >60 mL/min Final  . GFR calc Af Amer 06/10/2017 >60  >60 mL/min Final   Comment: (NOTE) The eGFR has been calculated using the CKD EPI equation. This calculation has not been validated in all clinical situations. eGFR's persistently <60 mL/min signify possible Chronic Kidney Disease.   Georgiann Hahn gap 06/10/2017 16* 5 - 15 Final   Performed at [facility], [place] [address], [place], [place] [address]  . Osmolality 06/10/2017 236* 275 - 295 mOsm/kg Corrected   Comment: CRITICAL RESULT CALLED TO, READ BACK BY AND VERIFIED WITH: RN Margarita Grizzle [phone] _[phone]  THANEY,REPORT CALLED BY [facility]  CRITICAL RESULT CALLED TO, READ BACK BY AND VERIFIED WITH: TRACEY HANEY AT [place] [facility] RWW. [facility] Performed at [facility], [address] [place], [place] [address] CORRECTED ON 04/26 AT 3094: PREVIOUSLY REPORTED AS 236 CRITICAL RESULT CALLED TO, READ BACK BY AND VERIFIED WITH: RN Margarita Grizzle [phone] _[phone]  THANEY,REPORT CALLED BY [place] [facility]    . Color, Urine 06/10/2017 STRAW* YELLOW Final  . APPearance 06/10/2017 CLEAR  CLEAR Final  . Specific Gravity, Urine 06/10/2017 1.003* 1.005 - 1.030 Final   . pH 06/10/2017 5.0  5.0 - 8.0 Final  . Glucose, UA 06/10/2017 NEGATIVE  NEGATIVE mg/dL Final  . Hgb urine dipstick 06/10/2017 NEGATIVE  NEGATIVE Final  . Bilirubin Urine 06/10/2017 NEGATIVE  NEGATIVE Final  . Ketones, ur 06/10/2017 20* NEGATIVE mg/dL Final  . Protein, ur 06/10/2017 NEGATIVE  NEGATIVE mg/dL Final  . Nitrite 06/10/2017 NEGATIVE  NEGATIVE Final  . Leukocytes, UA 06/10/2017 NEGATIVE  NEGATIVE Final   Performed at [place] [address], [place], [place] [address]  . Osmolality, Ur 06/10/2017 127* 300 - 900 mOsm/kg Final   Performed at [facility], [place] [place], [place] [address]  . Sodium, Ur 06/10/2017 15  mmol/L Final   Performed at [place] [address] [place], [place] [address]  . Creatinine, Urine 06/10/2017 49.80  mg/dL Final   Performed at [place] [address], [place], [place] [address]  . Sodium, Ur 06/10/2017 33  mmol/L Final   Performed  at [facility], [place] [address], [place], [place] [address]  . Osmolality, Ur 06/10/2017 277* 300 - 900 mOsm/kg Final   Performed at [place] [address], [place], [place] [address]  . HIV Screen 4th Generation wRfx 06/10/2017 Non Reactive  Non Reactive Final   Comment: (NOTE) Performed At: [facility] [place], [place] [id number] Rush Farmer MD MB:[phone] Performed at [facility], [place] [address] [place], [place] [address]   . Sodium 06/10/2017 118* 135 - 145 mmol/L Final   Comment: CRITICAL RESULT CALLED TO, READ BACK BY AND VERIFIED WITH: MACABYAG,RN AT 1714 ON 06/10/2017 BY MOSLEY,J   . Potassium 06/10/2017 3.5  3.5 - 5.1 mmol/L Final  . Chloride 06/10/2017 80* 101 - 111 mmol/L Final  . CO2 06/10/2017 24  22 - 32 mmol/L Final  . Glucose, Bld 06/10/2017 119* 65 - 99 mg/dL Final  . BUN 06/10/2017 8  6 - 20 mg/dL Final  . Creatinine, Ser  06/10/2017 0.77  0.61 - 1.24 mg/dL Final  . Calcium 06/10/2017 9.5  8.9 - 10.3 mg/dL Final  . GFR calc non Af Amer 06/10/2017 >60  >60 mL/min Final  . GFR calc Af Amer 06/10/2017 >60  >60 mL/min Final   Comment: (NOTE) The eGFR has been calculated using the CKD EPI equation. This calculation has not been validated in all clinical situations. eGFR's persistently <60 mL/min signify possible Chronic Kidney Disease.   Georgiann Hahn gap 06/10/2017 14  5 - 15 Final   Performed at [facility], [place] [address] [place], [place] [address]  . Sodium 06/10/2017 120* 135 - 145 mmol/L Final  . Potassium 06/10/2017 3.6  3.5 - 5.1 mmol/L Final  . Chloride 06/10/2017 82* 101 - 111 mmol/L Final  . CO2 06/10/2017 26  22 - 32 mmol/L Final  . Glucose, Bld 06/10/2017 110* 65 - 99 mg/dL Final  . BUN 06/10/2017 9  6 - 20 mg/dL Final  . Creatinine, Ser 06/10/2017 0.79  0.61 - 1.24 mg/dL Final  . Calcium 06/10/2017 9.2  8.9 - 10.3 mg/dL Final  . GFR calc non Af Amer 06/10/2017 >60  >60 mL/min Final  . GFR calc Af Amer 06/10/2017 >60  >60 mL/min Final   Comment: (NOTE) The eGFR has been calculated using the CKD EPI equation. This calculation has not been validated in all clinical situations. eGFR's persistently <60 mL/min signify possible Chronic Kidney Disease.   Georgiann Hahn gap 06/10/2017 12  5 - 15 Final   Performed at [facility], [place] [address] [place], [place] [address]  . Sodium 06/11/2017 120* 135 - 145 mmol/L Final  . Potassium 06/11/2017 3.6  3.5 - 5.1 mmol/L Final  . Chloride 06/11/2017 82* 101 - 111 mmol/L Final  . CO2 06/11/2017 26  22 - 32 mmol/L Final  . Glucose, Bld 06/11/2017 122* 65 - 99 mg/dL Final  . BUN 06/11/2017 8  6 - 20 mg/dL Final  . Creatinine, Ser 06/11/2017 0.75  0.61 - 1.24 mg/dL Final  . Calcium 06/11/2017 9.3  8.9 - 10.3 mg/dL Final  . GFR calc non Af Amer 06/11/2017 >60  >60 mL/min Final  . GFR calc Af Amer 06/11/2017 >60  >60 mL/min Final    Comment: (NOTE) The eGFR has been calculated using the CKD EPI equation. This calculation has not been validated in all clinical situations. eGFR's persistently <60 mL/min signify possible Chronic Kidney Disease.   Marland Kitchen  Anion gap 06/11/2017 12  5 - 15 Final   Performed at [facility], [place] [address], [place], [place] [address]  . Sodium 06/11/2017 119* 135 - 145 mmol/L Final   Comment: CRITICAL RESULT CALLED TO, READ BACK BY AND VERIFIED WITH: [facility] RN AT 3545 06/11/17 BY TIBBITTS,K   . Potassium 06/11/2017 3.6  3.5 - 5.1 mmol/L Final  . Chloride 06/11/2017 84* 101 - 111 mmol/L Final  . CO2 06/11/2017 25  22 - 32 mmol/L Final  . Glucose, Bld 06/11/2017 95  65 - 99 mg/dL Final  . BUN 06/11/2017 6  6 - 20 mg/dL Final  . Creatinine, Ser 06/11/2017 0.85  0.61 - 1.24 mg/dL Final  . Calcium 06/11/2017 9.0  8.9 - 10.3 mg/dL Final  . GFR calc non Af Amer 06/11/2017 >60  >60 mL/min Final  . GFR calc Af Amer 06/11/2017 >60  >60 mL/min Final   Comment: (NOTE) The eGFR has been calculated using the CKD EPI equation. This calculation has not been validated in all clinical situations. eGFR's persistently <60 mL/min signify possible Chronic Kidney Disease.   Georgiann Hahn gap 06/11/2017 10  5 - 15 Final   Performed at [facility], [place] [address], [place], [place] [address]  . WBC 06/11/2017 5.4  4.0 - 10.5 K/uL Final  . RBC 06/11/2017 2.67* 4.22 - 5.81 MIL/uL Final  . Hemoglobin 06/11/2017 9.4* 13.0 - 17.0 g/dL Final  . HCT 06/11/2017 25.6* 39.0 - 52.0 % Final  . MCV 06/11/2017 95.9  78.0 - 100.0 fL Final  . MCH 06/11/2017 35.2* 26.0 - 34.0 pg Final  . MCHC 06/11/2017 36.7* 30.0 - 36.0 g/dL Final  . RDW 06/11/2017 13.2  11.5 - 15.5 % Final  . Platelets 06/11/2017 324  150 - 400 K/uL Final   Performed at [facility], [place] [address], [place], [place] [address]  . Magnesium 06/11/2017 1.0* 1.7 - 2.4 mg/dL Final   Performed at [place] [address] [place], [place] [address]  . Sodium 06/11/2017 123* 135 - 145 mmol/L Final  . Potassium 06/11/2017 3.2* 3.5 - 5.1 mmol/L Final  . Chloride 06/11/2017 90* 101 - 111 mmol/L Final  . CO2 06/11/2017 24  22 - 32 mmol/L Final  . Glucose, Bld 06/11/2017 94  65 - 99 mg/dL Final  . BUN 06/11/2017 5* 6 - 20 mg/dL Final  . Creatinine, Ser 06/11/2017 0.55* 0.61 - 1.24 mg/dL Final  . Calcium 06/11/2017 8.1* 8.9 - 10.3 mg/dL Final  . GFR calc non Af Amer 06/11/2017 >60  >60 mL/min Final  . GFR calc Af Amer 06/11/2017 >60  >60 mL/min Final   Comment: (NOTE) The eGFR has been calculated using the CKD EPI equation. This calculation has not been validated in all clinical situations. eGFR's persistently <60 mL/min signify possible Chronic Kidney Disease.   Georgiann Hahn gap 06/11/2017 9  5 - 15 Final   Performed at [facility], [place] [address], [place],  [address]  . Sodium 06/12/2017 127* 135 - 145 mmol/L Final  . Potassium 06/12/2017 4.1  3.5 - 5.1 mmol/L Final   Comment: DELTA CHECK NOTED NO VISIBLE HEMOLYSIS   . Chloride 06/12/2017 95* 101 - 111 mmol/L Final  . CO2 06/12/2017 23  22 - 32 mmol/L Final  . Glucose, Bld 06/12/2017 97  65 - 99 mg/dL Final  . BUN 06/12/2017 <5* 6 - 20 mg/dL Final  . Creatinine, Ser  06/12/2017 0.56* 0.61 - 1.24 mg/dL Final  . Calcium 06/12/2017 8.5* 8.9 - 10.3 mg/dL Final  . GFR calc non Af Amer 06/12/2017 >60  >60 mL/min Final  . GFR calc Af Amer 06/12/2017 >60  >60 mL/min Final   Comment: (NOTE) The eGFR has been calculated using the CKD EPI equation. This calculation has not been validated in all clinical situations. eGFR's persistently <60 mL/min signify possible Chronic Kidney Disease.   Georgiann Hahn gap 06/12/2017 9  5 - 15 Final   Performed at [facility], [place] [address], [place], [place] [address]  . Magnesium 06/12/2017 1.2* 1.7 - 2.4 mg/dL Final   Performed at [place] [address] [place], [place] [address]  Appointment on 06/10/2017  Component Date Value Ref Range Status  . Magnesium 06/10/2017 0.8* 1.7 - 2.4 mg/dL Final   Comment: Performed at [facility], [place] [address], [place], [place] [address] CRITICAL RESULT CALLED TO, READ BACK BY AND VERIFIED WITH: kristen swindle, rn at 929    . Sodium 06/10/2017 115* 136 - 145 mmol/L Final   CRITICAL RESULT CALLED TO, READ BACK BY AND VERIFIED WITH: kristen swindle, rn at 929   . Potassium 06/10/2017 3.7  3.5 - 5.1 mmol/L Final  . Chloride 06/10/2017 73* 98 - 109 mmol/L Final  . CO2 06/10/2017 25  22 - 29 mmol/L Final  . Glucose, Bld 06/10/2017 118  70 - 140 mg/dL Final  . BUN 06/10/2017 10  7 - 26 mg/dL Final  . Creatinine 06/10/2017 1.25  0.70 - 1.30 mg/dL Final  . Calcium 06/10/2017 10.7* 8.4 - 10.4 mg/dL Final  . Total Protein 06/10/2017 8.2  6.4 - 8.3 g/dL Final  . Albumin 06/10/2017 3.8  3.5 - 5.0 g/dL Final  . AST 06/10/2017 23  5 - 34 U/L Final  . ALT 06/10/2017 16  0 - 55 U/L Final  . Alkaline Phosphatase 06/10/2017 94  40 - 150 U/L Final  . Total Bilirubin 06/10/2017 0.5  0.2 - 1.2 mg/dL Final  . GFR, Est Non Af Am 06/10/2017 >60  >60 mL/min Final  . GFR, Est AFR Am 06/10/2017 >60  >60 mL/min Final   Comment: (NOTE) The eGFR has been calculated using the CKD EPI equation. This calculation has not been validated in all clinical situations. eGFR's persistently <60 mL/min signify possible Chronic Kidney Disease.   Georgiann Hahn gap 06/10/2017 17* 3 - 11 Final   Performed at [facility], [place] [address], [place], [place] [address]  . WBC Count 06/10/2017 4.4  4.0 - 10.3 K/uL Final  . RBC 06/10/2017 3.38* 4.20 - 5.82 MIL/uL Final  . Hemoglobin 06/10/2017 11.8* 13.0 - 17.1 g/dL Final  . HCT 06/10/2017 32.6* 38.4 - 49.9 % Final  . MCV 06/10/2017 96.4  79.3 - 98.0 fL Final  . MCH 06/10/2017 34.9* 27.2 - 33.4 pg Final  . MCHC 06/10/2017  36.2* 32.0 - 36.0 g/dL Final  . RDW 06/10/2017 13.3  11.0 - 14.6 % Final  . Platelet Count 06/10/2017 341  140 - 400 K/uL Final  . Neutrophils Relative % 06/10/2017 74  % Final  . Neutro Abs 06/10/2017 3.3  1.5 - 6.5 K/uL Final  . Lymphocytes Relative 06/10/2017 5  % Final  . Lymphs Abs 06/10/2017 0.2* 0.9 - 3.3 K/uL Final  . Monocytes Relative 06/10/2017 20  % Final  . Monocytes Absolute 06/10/2017 0.9  0.1 - 0.9 K/uL  Final  . Eosinophils Relative 06/10/2017 0  % Final  . Eosinophils Absolute 06/10/2017 0.0  0.0 - 0.5 K/uL Final  . Basophils Relative 06/10/2017 1  % Final  . Basophils Absolute 06/10/2017 0.0  0.0 - 0.1 K/uL Final   Performed at [facility], [place] [address] [place], [place] [address]       Ardath Sax, MD

## 2017-06-13 NOTE — Progress Notes (Signed)
.      Durable Medical Equipment  (From admission, onward)        Start     Ordered   06/13/17 1053  For home use only DME Hospital bed  Once    Question Answer Comment  The above medical condition requires: Patient requires the ability to reposition frequently   Head must be elevated greater than: 45 degrees   Bed type Semi-electric      06/13/17 1052   06/12/17 1447  For home use only DME 4 wheeled rolling walker with seat  Once    Question:  Patient needs a walker to treat with the following condition  Answer:  Cancer ([place])   06/12/17 1447   06/12/17 1447  For home use only DME 3 n 1  Once     06/12/17 1447   06/12/17 1436  For home use only DME oxygen  Once    Comments:  Home RT -humidified oxygen Trach Collar 28%  Question Answer Comment  Liters per Minute 5   Frequency Continuous (stationary and portable oxygen unit needed)   Oxygen delivery system Gas      06/12/17 1439   06/12/17 1432  For home use only DME Trach supplies  Once    Question Answer Comment  Trach Type [place]   Size 4   Cuffed or Uncuffed Uncuffed   Fenestrated No   Does patient need speaking valve? No   Trach Humidification Yes   If Oxygen Bleed In (lpm) Trach collar FiO2 28%/5 L   Suction Needed? Yes   Manual Resuscitator Needed? Yes   Emergency Backup Trach size (one size smaller than trach in throat) 3      06/12/17 1434

## 2017-06-13 NOTE — Progress Notes (Signed)
PROGRESS NOTE    Lawrence Arias  OZH:[id number] DOB: [birth year] DOA: 06/10/2017 PCP: System, Provider Not In    Brief Narrative:  57 year old male who presented abdominal pain and chronic anemia.  Patient does have significant past medical history of throat cancer.  Patient had recently his J-tube removed, significant drainage through the open stoma, serous fluid.  On physical examination blood pressure 115/96, heart rate 89, respiratory 20, oxygen saturation 98%.  Mucous membranes, neck with tracheostomy in place, lungs are clear to auscultation bilaterally, heart S1-S2 present, no gallops, rubs or murmurs, the abdomen was tender to palpation, at site where the PEG tube placed had been opening wound, with ulcerated skin surrounding the stoma, stage II with regular markings.  No lower extremity edema.  Sodium 118, potassium 3.5, chloride 80, bicarb 24, glucose 118, BUN 8, creatinine 0.77, white count 5.4, hemoglobin 9.4, hematocrit 25.6, platelets 324.   Patient was admitted to the hospital with a working diagnosis of worsening hyponatremia, due to dehydration.    Assessment & Plan:   Active Problems:   Oral cancer (HCC)   Hyponatremia   Hypomagnesemia   1. Hypo-osmolar hyponatremia. Na up to 127, patient tolerating po well, n nausea or vomiting. Plan for discharge in am, follow with primary care in 7 days. Will add mg sulftate 2 grams today.   2. Abdominal pain with drainage at the site of peg tube. Stable wound, dressing in place, no nausea or vomiting, pain is well controlled.  3. Throat cancer sp tracheostomy. Will continue pain control, aspiration precautions. Case manager to arrange home supplies for trach care including home oxygen. Will add hospital bed. Home health services.   4. HTN. On lisinopril with good blood pressure control.   DVT prophylaxis: scd  Code Status: full Family Communication: no family at the bedside  Disposition Plan: Plan to discharge home in am  with equipment for trach care and home 02.    Consultants:   Oncology   Procedures:     Antimicrobials:   Subjective: Patient is feeling better, tolerating po well, no nausea or vomiting, no dyspnea or chest pain.   Objective: Vitals:   06/13/17 0021 06/13/17 0300 06/13/17 0532 06/13/17 0758  BP:   98/71   Pulse:   99   Resp:   20   Temp:   99.5 F (37.5 C)   TempSrc:   Oral   SpO2: 93% 93% 90% 91%  Weight:      Height:        Intake/Output Summary (Last 24 hours) at 06/13/2017 1050 Last data filed at 06/12/2017 1430 Gross per 24 hour  Intake 480 ml  Output -  Net 480 ml   Filed Weights   06/10/17 1940 06/11/17 1200  Weight: 44.5 kg (98 lb 3.2 oz) 44.6 kg (98 lb 4.8 oz)    Examination:   General: Not in pain or dyspnea.  Neurology: Awake and alert, non focal  E ENT: no pallor, no icterus, oral mucosa moist/ trach in place.  Cardiovascular: No JVD. S1-S2 present, rhythmic, no gallops, rubs, or murmurs. No lower extremity edema. Pulmonary: vesicular breath sounds bilaterally, adequate air movement, no wheezing, rhonchi or rales. Gastrointestinal. Abdomen with no organomegaly, non tender, no rebound or guarding/ dressing in place.  Skin. No rashes Musculoskeletal: no joint deformities     Data Reviewed: I have personally reviewed following labs and imaging studies  CBC: Recent Labs  Lab 06/10/17 0832 06/10/17 1020 06/11/17 0535  WBC 4.4 4.7  5.4  NEUTROABS 3.3 3.4  --   HGB 11.8* 10.4* 9.4*  HCT 32.6* 28.6* 25.6*  MCV 96.4 95.3 95.9  PLT 341 323 585   Basic Metabolic Panel: Recent Labs  Lab 06/10/17 0832  06/10/17 2025 06/11/17 0125 06/11/17 0535 06/11/17 1200 06/12/17 0500  NA 115*   < > 120* 120* 119* 123* 127*  K 3.7   < > 3.6 3.6 3.6 3.2* 4.1  CL 73*   < > 82* 82* 84* 90* 95*  CO2 25   < > 26 26 25 24 23   GLUCOSE 118   < > 110* 122* 95 94 97  BUN 10   < > 9 8 6  5* <5*  CREATININE 1.25   < > 0.79 0.75 0.85 0.55* 0.56*  CALCIUM  10.7*   < > 9.2 9.3 9.0 8.1* 8.5*  MG 0.8*  --   --   --  1.0*  --  1.2*   < > = values in this interval not displayed.   GFR: Estimated Creatinine Clearance: 63.5 mL/min (A) (by C-G formula based on SCr of 0.56 mg/dL (L)). Liver Function Tests: Recent Labs  Lab 06/10/17 0832  AST 23  ALT 16  ALKPHOS 94  BILITOT 0.5  PROT 8.2  ALBUMIN 3.8   No results for input(s): LIPASE, AMYLASE in the last 168 hours. No results for input(s): AMMONIA in the last 168 hours. Coagulation Profile: No results for input(s): INR, PROTIME in the last 168 hours. Cardiac Enzymes: No results for input(s): CKTOTAL, CKMB, CKMBINDEX, TROPONINI in the last 168 hours. BNP (last 3 results) No results for input(s): PROBNP in the last 8760 hours. HbA1C: No results for input(s): HGBA1C in the last 72 hours. CBG: No results for input(s): GLUCAP in the last 168 hours. Lipid Profile: No results for input(s): CHOL, HDL, LDLCALC, TRIG, CHOLHDL, LDLDIRECT in the last 72 hours. Thyroid Function Tests: No results for input(s): TSH, T4TOTAL, FREET4, T3FREE, THYROIDAB in the last 72 hours. Anemia Panel: No results for input(s): VITAMINB12, FOLATE, FERRITIN, TIBC, IRON, RETICCTPCT in the last 72 hours.    Radiology Studies: I have reviewed all of the imaging during this hospital visit personally     Scheduled Meds: . guaiFENesin  600 mg Oral BID  . heparin  5,000 Units Subcutaneous Q8H  . lisinopril  20 mg Oral Daily  . magnesium oxide  400 mg Oral BID   Continuous Infusions:   LOS: 2 days        Cylan Borum Gerome Apley, MD [facility] Pager [phone]

## 2017-06-14 MED ORDER — HEPARIN SOD (PORK) LOCK FLUSH 100 UNIT/ML IV SOLN
500.0000 [IU] | INTRAVENOUS | Status: DC | PRN
  Filled 2017-06-14: qty 5

## 2017-06-14 MED ORDER — HEPARIN SOD (PORK) LOCK FLUSH 100 UNIT/ML IV SOLN
500.0000 [IU] | INTRAVENOUS | Status: DC
  Administered 2017-06-14: 500 [IU]

## 2017-06-14 NOTE — Progress Notes (Signed)
Rt. Chest port de accessed after being flushed with 500 units of heparin, gauze dressing applied per pt's request. Discharge instructions explained to pt, he has several appointments already scheduled with MD's.O2 tank delivered to his hospital room. Some supplies given to pt to change his abdominal dressing. Abdominal dressing changed before discharge, cleansed, barrier cream applied with 4x4 and ABD pad applied. Netting around waist to hold dressing in place. [employer] called to pick up pt and take him home, voucher given to cab driver. Pt instructed on how  And when to change his dressing. Lawrence Arias was discharged via wheelchair. Denies having any questions about discharge.

## 2017-06-14 NOTE — Consult Note (Addendum)
[facility] Nurse wound consult note Reason for Consult: Consult requested for abd wound.  Pt had a previous G-tube to this location and states it been draining since the tube was removed. Very painful to touch. Wound type: Full thickness tract from previous G-tube; .2X.2X.5cm Wound bed: Surrounding skin is 50% yellow, 50% red, moist and macerated, full thickness wound surrounding tract is approx 1X1X.1cm Drainage (amount, consistency, odor) mod amt tan drainage Periwound: Patchy areas of pink macerated skin; appearance consistent with moisture associated skin damage to 5 cm surrounding area on the abd Dressing procedure/placement/frequency: Topical treatment will be minimally effective while site continues to drain. Barrier cream to protect and repel moisture.  Gauze and ABD pads to absorb drainage, mesh "tank top" to hold in place. Discussed plan of care with patient. Please re-consult if further assistance is needed.  Thank-you,  Julien Girt MSN, [place], [place], [place], [place]

## 2017-06-14 NOTE — Progress Notes (Signed)
Patient is medically stable to be discharged home, home health and equipment has been arranged.

## 2017-06-14 NOTE — Evaluation (Signed)
Physical Therapy One Time Evaluation and Discharge Patient Details Name: Lawrence Arias MRN: [id number] DOB: 1960/07/07 Today's Date: 06/14/2017   History of Present Illness  57 year old male who presented abdominal pain and chronic anemia.  Patient does have significant past medical history of throat cancer.  Patient had recently his J-tube removed, significant drainage through the open stoma, serous fluid.  Pt admitted for Hypo-osmolar hyponatremia and abdominal pain with drainage at the site of peg tube  Clinical Impression  Patient evaluated by Physical Therapy with no further acute PT needs identified. All education has been completed and the patient has no further questions.  Pt reports he is very independent.  Pt mobilizing well and SPO2 remained in upper 90s on room air during session.  Pt plans to f/u with HHPT. See below for any follow-up Physical Therapy or equipment needs. PT is signing off. Thank you for this referral.     Follow Up Recommendations Home health PT    Equipment Recommendations  None recommended by PT    Recommendations for Other Services       Precautions / Restrictions Precautions Precautions: None Restrictions Weight Bearing Restrictions: No      Mobility  Bed Mobility Overal bed mobility: Modified Independent                Transfers Overall transfer level: Modified independent                  Ambulation/Gait Ambulation/Gait assistance: Supervision;Modified independent (Device/Increase time) Ambulation Distance (Feet): 400 Feet Assistive device: None Gait Pattern/deviations: WFL(Within Functional Limits)     General Gait Details: no deficits observed, pt denies SOB, SPO2 99% room air upon returning to bed  Stairs            Wheelchair Mobility    Modified Rankin (Stroke Patients Only)       Balance Overall balance assessment: No apparent balance deficits (not formally assessed)                                            Pertinent Vitals/Pain Pain Assessment: Faces Faces Pain Scale: Hurts little more Pain Location: abdominal wound site (tender to dressing) Pain Intervention(s): Monitored during session;Repositioned    Home Living Family/patient expects to be discharged to:: Private residence Living Arrangements: Parent;Other relatives Available Help at Discharge: Family Type of Home: House       Home Layout: One level Home Equipment: None      Prior Function Level of Independence: Independent               Hand Dominance        Extremity/Trunk Assessment        Lower Extremity Assessment Lower Extremity Assessment: Overall WFL for tasks assessed(diffuse muscle atrophy throughout, cachexic appearance)    Cervical / Trunk Assessment Cervical / Trunk Assessment: Other exceptions Cervical / Trunk Exceptions: seen by OP PT in March 2019 for neck and shoulder limitations, reports he is continuing his HEP  Communication   Communication: Tracheostomy  Cognition Arousal/Alertness: Awake/alert Behavior During Therapy: WFL for tasks assessed/performed Overall Cognitive Status: Within Functional Limits for tasks assessed                                        General Comments  Exercises     Assessment/Plan    PT Assessment All further PT needs can be met in the next venue of care  PT Problem List Decreased strength;Decreased range of motion       PT Treatment Interventions      PT Goals (Current goals can be found in the Care Plan section)  Acute Rehab PT Goals PT Goal Formulation: All assessment and education complete, DC therapy    Frequency     Barriers to discharge        Co-evaluation               AM-PAC PT "6 Clicks" Daily Activity  Outcome Measure Difficulty turning over in bed (including adjusting bedclothes, sheets and blankets)?: None Difficulty moving from lying on back to sitting on the side  of the bed? : None Difficulty sitting down on and standing up from a chair with arms (e.g., wheelchair, bedside commode, etc,.)?: None Help needed moving to and from a bed to chair (including a wheelchair)?: None Help needed walking in hospital room?: None Help needed climbing 3-5 steps with a railing? : None 6 Click Score: 24    End of Session Equipment Utilized During Treatment: Gait belt Activity Tolerance: Patient tolerated treatment well Patient left: in bed;with call bell/phone within reach   PT Visit Diagnosis: Difficulty in walking, not elsewhere classified (R26.2)    Time: 7530-1040 PT Time Calculation (min) (ACUTE ONLY): 12 min   Charges:   PT Evaluation $PT Eval Low Complexity: 1 Low     PT G CodesCarmelia Bake, PT, DPT 06/14/2017 Pager: [phone]   York Ram E 06/14/2017, 12:41 PM

## 2017-06-16 NOTE — Progress Notes (Signed)
[facility] Follow-up Visit:  Assessment: Squamous cell cancer of retromolar trigone ([place]) 57 y.o.  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  Patient was supposed to be on systemic chemotherapy, but did not follow-up with any of the scheduled appointments in the beginning of the treatment necessitating discontinuation of the systemic therapy plan.  Later in the course of radiation, patient returned requesting initiation of systemic therapy, but my opinion, the benefit was minimal at that point and I did not proceed with any systemic treatment considering patient had less than a week left of radiation at that time.  Restaging imaging of neck and chest is suggestive of possible disease recurrence. Patient presents to the clinic today with complaints of oral and throat pain.  Examination confirms progressive swelling and likely progressive malignancy in the neck based on evaluation and palpation.  Presently, he is potential follow-up at [facility] and scheduled on 05/12/17 which is likely too far removed due to visibly progressive malignancy.  Patient is currently undergoing palliative-intent systemic chemotherapy with carboplatin and paclitaxel administered every 3 weeks.  Appears to be tolerating chemotherapy reasonably well despite the high dose of treatment administered.  Currently, no new complaints or significant annual laboratory abnormalities.  Continues to have significant hyponatremia without associated symptoms.  Plan: -Continue OxyContin/oxycodone combination for pain control. -Return to clinic in 2 weeks for the next cycle of systemic chemotherapy -Next scheduled disease assessment will occur after 3 cycles of systemic chemotherapy with CT of the neck and chest.  Voice recognition software was used and creation of this note. Despite my best effort at editing the text, some  misspelling/errors may have occurred.  Orders Placed This Encounter  Procedures  . CBC with Differential (Cancer Center Only)    Standing Status:   Future    Number of Occurrences:   1    Standing Expiration Date:   05/29/2018  . CMP ([place] only)    Standing Status:   Future    Number of Occurrences:   1    Standing Expiration Date:   05/29/2018  . Magnesium    Standing Status:   Future    Number of Occurrences:   1    Standing Expiration Date:   05/28/2018    Cancer Staging Oral cancer [facility]) Staging form: Oral Cavity, AJCC 8th Edition - Clinical: Stage III (cT3, cN1, cM0) - Signed by Eppie Gibson, MD on 08/25/2016  Squamous cell cancer of retromolar trigone ([place]) Staging form: Oral Cavity, AJCC 8th Edition - Pathologic stage from 10/16/2016: Stage IVB (pT2, pN3b, cM0) - Unsigned   All questions were answered.  . The patient knows to call the clinic with any problems, questions or concerns.  This note was electronically signed.    History of Presenting Illness Lawrence Arias is a 57 y.o. followed in the [place] for diagnosis of squamous cell carcinoma of the left retromolar trigone. Since the diagnosis in Jul 2018, patient underwent surgery on 10/16/16 including graft tissue placement. Recovered well from the operation, and he is referred to medical oncology for possible systemic chemotherapy as an adjunctive for the adjuvant radiation therapy. His primary ENT is Dr. Janace Hoard and Radiation Oncologist is Dr Isidore Moos.  Due to concerns for residual/recurrent disease in the surgical bed, patient was recommended to undergo chemoradiotherapy and was planned for the treatment in mid October 2018.  Nevertheless, he did not show up for several appointments with  our clinic and eventually we made decision to forego chemotherapy.  Patient is nearing completion of his radiation therapy and tolerated it quite well so far.   Patient returns to the clinic for toxicity monitoring  while receiving systemic palliative systemic therapy with carboplatin and paclitaxel.  Patient denies any fevers, chills, night sweats.  No breakthrough uncontrolled nausea.  Pain is currently well controlled.  Main problem that the patient is describing is leaking and burning around the PEG tube.  Oncological/hematological History:   Squamous cell cancer of retromolar trigone ([place])   07/24/2016 Initial Diagnosis    Squamous cell cancer of retromolar trigone [facility]): Initial biopsy demonstrating moderately differentiated invasive squamous cell carcinoma.       08/14/2016 Imaging    CT neck: Nonvisualization of left retromolar trigone mucosal lesion. No evidence of invasion into the adjacent structures. 2. Left level IIa lymph node measuring 1 cm, likely indicating spread of disease. No contralateral adenopathy.      09/29/2016 Imaging    CT Neck [facility]): Left-sided pathological lymphadenopathy of metastatic disease at level IB, IIA. Possible involvement of level IA, level III, and level IV on the left side.      10/16/2016 Surgery    Dr Harriett Rush Sunny [place]lein [place]  [facility]): LEFT TONSIL, SOFT PALATE, FLOOR OF MOUTH, BASE OF TONGUE, AND MANDIBLE, PARTIAL GLOSSOPHARYNGECTOMY AND PARTIAL MANDIBULECTOMY, LEFT PARAPHARYNGEAL MASS, EXCISION, TEETH, REMOVAL       10/16/2016 Pathology Results    Surgical Pathology:  LEFT TONSIL, SOFT PALATE, FLOOR OF MOUTH, BASE OF TONGUE, AND MANDIBLE, PARTIAL GLOSSOPHARYNGECTOMY AND PARTIAL MANDIBULECTOMY: Invasive squamous cell carcinoma, keratinizing type, moderately differentiated, is identified forming a 3.7x1.8x1.0cm mass in the left retromolar area.Tumor invades to a depth of 0.8 cm.Lymphovascular invasion is identified. Perineural invasion is identified. The medial soft tissue margin is positive for invasive squamous cell carcinoma (focal). LEFT PARAPHARYNGEAL MASS, EXCISION: Invasive squamous cell carcinoma forming a 1.2x1.2x0.5cm mass. Invasive carcinoma  involves the unoriented soft tissue margin over a linear length of 0.3 cm. TEETH, REMOVAL: Two teeth (gross examination only).  Margins: "TUMOR NEXT TO BONE", EXCISION: Scant fragments of bone and dense fibrous tissue, negative for carcinoma. "PHARYNGEAL WALL MARGIN", EXCISION: Squamous mucosa with acute inflammation and skeletal muscle, negative for carcinoma. "HARD PALATE MARGIN", EXCISION: Squamous mucosa, negative for carcinoma. "TONGUE BASE MARGIN", EXCISION: Squamous mucosa with chronic inflammation, negative for carcinoma. "INFERIOR BUCCAL MUCOSA MARGIN", EXCISION: Squamous mucosa and skeletal muscle, negative for carcinoma. "SUPERIOR BUCCAL MUCOSAL MARGIN", EXCISION: Squamous mucosa with chronic inflammation, negative for carcinoma.  Lymph nodes:  LEVEL 1A NECK CONTENTS, LYMPH NODE DISSECTION: Three lymph nodes, negative for metastatic carcinoma (0/3). LEFT LEVEL 1B NECK CONTENTS, LYMPH NODE DISSECTION: Metastatic carcinoma in two of five lymph nodes (2/5). Largest metastatic deposit measures 2.4 cm. Definitive extranodal extension is not identified. Salivary gland parenchyma, negative for tumor. LEFT LEVEL 2B NECK CONTENTS, LYMPH NODE DISSECTION: Eight lymph nodes, negative for metastatic carcinoma (0/8). LEFT LEVEL 2A, 3, 4, AND 5 NECK CONTENTS, LYMPH NODE DISSECTION: Metastatic carcinoma in one of twenty-two lymph nodes (1/22). Largest metastatic deposit measures 3.5 cm. Extranodal extension is present and measures 1.5 mm beyond the lymph node capsule.  COMMENT: Immunohistochemical staining for p16 (Block A4) shows the invasive carcinoma is diffusely positive for p16.The positive control stains appropriately.      11/04/2016 Imaging    CT Neck [facility]): No pathological lymphadenopathy. Surgical changes noted. Significant fluid collection in the left upper neck with internal gas and significant soft tissue  swelling.      11/30/2016 Imaging    CT Neck: Postop resection of retromolar  trigone carcinoma on the left with free flap. Postsurgical changes left neck without evidence of recurrent tumor.   CT Chest: No suspicious pulmonary nodules or masses identified. No thoracic adenopathy.      12/31/2016 - 02/23/2017 Radiation Therapy    Left oral cavity and regional nodes: --66Gy/33 fx        03/25/2017 Relapse/Recurrence    CT Neck & Chest: Likely recurrent disease in the neck with left submandibular mass measuring 3.0 cm.  Multiple new small pulmonary nodules worrisome for possible metastatic disease.      04/30/2017 -  Chemotherapy    Palliative systemic chemotherapy: Carboplatin AUC 6, d1 + Paclitaxel '225mg'$ /m2, d1 Q21d --Cycle #1, 04/30/17: --Cycle #2, 85/88/50: Complicated by progressive hyponatremia (Grade 4), hypomagnesemia (Grade 3) --Cycle #3, ...: Delayed due to complications of the previous cycle.        Medical History: Past Medical History:  Diagnosis Date  . Cancer ([place])   . GERD (gastroesophageal reflux disease)    uses OTC prn  . History of radiation therapy 12/31/16- 02/23/2017   Left oral cavity and regional nodes, 66 Gy in 33 fractions.   . Hypertension   . Tongue lesion     Surgical History: Past Surgical History:  Procedure Laterality Date  . DIRECT LARYNGOSCOPY N/A 07/24/2016   Procedure: DIRECT LARYNGOSCOPY;  Surgeon: Melissa Montane, MD;  Location: [place];  Service: ENT;  Laterality: N/A;  Direct laryngoscopy with biopsy tongue lesion  . FRACTURE SURGERY     left wrist, remote  . FREE FLAP RADIAL FOREARM Left 10/16/2016   Dr. Hendricks [facility]  . free flap scapular Left 10/16/2016   Dr. Hendricks Limes - [facility] hospital  . GASTROSTOMY  10/27/2016   Dr. Daphene Calamity- [facility] hospital.   . IR FLUORO GUIDE PORT INSERTION RIGHT  12/22/2016  . IR US GUIDE VASC ACCESS RIGHT  12/22/2016  . NECK DISSECTION Bilateral 10/16/2016   Selective neck dissection, Dr. Hendricks Limes [facility]  . removal oral cancer Left  10/16/2016   resection of oral cancer, Dr. Hendricks Limes [facility]  . SKIN GRAFT Left 10/16/2016   Split thickness skin graft, leg Dr. Hendricks Limes. [facility]  . TONGUE BIOPSY  07/24/2016   Procedure: TONGUE BIOPSY;  Surgeon: Melissa Montane, MD;  Location: [place];  Service: ENT;;  . TRACHEOSTOMY  10/16/2016   Dr. Hendricks Limes [facility]  . TRACHEOSTOMY TUBE PLACEMENT N/A 04/29/2017   Procedure: TRACHEOSTOMY REVISION;  Surgeon: Jodi Marble, MD;  Location: [facility] OR;  Service: ENT;  Laterality: N/A;    Family History: Family History  Problem Relation Age of Onset  . Hypertension Mother   . Hypertension Father     Social History: Social History   Socioeconomic History  . Marital status: Single    Spouse name: Not on file  . Number of children: 3  . Years of education: Not on file  . Highest education level: Not on file  Occupational History  . Not on file  Social Needs  . Financial resource strain: Not on file  . Food insecurity:    Worry: Not on file    Inability: Not on file  . Transportation needs:    Medical: Not on file    Non-medical: Not on file  Tobacco Use  . Smoking status: Current Some Day Smoker    Packs/day: 0.50    Types:  Cigarettes  . Smokeless tobacco: Never Used  . Tobacco comment: he is smoking 0ne every other day (11/20/16)  Substance and Sexual Activity  . Alcohol use: Not on file  . Drug use: No  . Sexual activity: Not on file  Lifestyle  . Physical activity:    Days per week: Not on file    Minutes per session: Not on file  . Stress: Not on file  Relationships  . Social connections:    Talks on phone: Not on file    Gets together: Not on file    Attends religious service: Not on file    Active member of club or organization: Not on file    Attends meetings of clubs or organizations: Not on file    Relationship status: Not on file  . Intimate partner violence:    Fear of current or ex partner: Not on file    Emotionally abused: Not on file    Physically abused:  Not on file    Forced sexual activity: Not on file  Other Topics Concern  . Not on file  Social History Narrative  . Not on file    Allergies: No Known Allergies  Medications:  Current Outpatient Medications  Medication Sig Dispense Refill  . acetaminophen (TYLENOL) 500 MG tablet Take 1,000 mg by mouth every 6 (six) hours as needed for moderate pain.    Marland Kitchen lisinopril (PRINIVIL,ZESTRIL) 20 MG tablet Take 20 mg by mouth daily.  0  . magnesium oxide (MAG-OX) 400 (241.3 Mg) MG tablet Take 1 tablet (400 mg total) by mouth 2 (two) times daily. 60 tablet 0  . ondansetron (ZOFRAN) 8 MG tablet Take 1 tablet (8 mg total) by mouth every 8 (eight) hours as needed for nausea. 30 tablet 0  . polyethylene glycol powder (MIRALAX) powder Use as directed for constipation. Follow directions on container. 850 g 3  . prochlorperazine (COMPAZINE) 10 MG tablet Take 1 tablet (10 mg total) by mouth every 6 (six) hours as needed for nausea or vomiting. 30 tablet 0   No current facility-administered medications for this visit.    Facility-Administered Medications Ordered in Other Visits  Medication Dose Route Frequency Provider Last Rate Last Dose  . alteplase (CATHFLO ACTIVASE) injection 2 mg  2 mg Intracatheter Once PRN Brieanna Nau, Marinell Blight, MD      . heparin lock flush 100 unit/mL  250 Units Intracatheter PRN Tyianna Menefee G, MD      . heparin lock flush 100 unit/mL  500 Units Intracatheter PRN Samarra Ridgely, Marinell Blight, MD      . sodium chloride flush (NS) 0.9 % injection 10 mL  10 mL Intracatheter PRN Lebron Conners, Marinell Blight, MD        Review of Systems: Review of Systems  Constitutional: Positive for fatigue.  HENT:   Positive for lump/mass, sore throat and trouble swallowing.   Respiratory: Positive for shortness of breath. Negative for hemoptysis.   All other systems reviewed and are negative.    PHYSICAL EXAMINATION Blood pressure (!) 134/96, pulse (!) 118, temperature 97.6 F (36.4 C), temperature source  Oral, resp. rate 18, height '5\' 7"'$  (1.702 m), weight 104 lb 3.2 oz (47.3 kg), SpO2 99 %.  ECOG PERFORMANCE STATUS: 2 - Symptomatic, <50% confined to bed  Physical Exam  Constitutional: He is oriented to person, place, and time and well-developed, well-nourished, and in no distress. No distress.  HENT:  Mouth/Throat: No oropharyngeal exudate.  Stable left facial and cervical swelling with progressive  palpable masses in the left neck including several dermal lesions including one with ulceration.  Tracheostomy in place with thick nonpurulent secretions.  Eyes: Pupils are equal, round, and reactive to light. EOM are normal. No scleral icterus.  Neck: Normal range of motion. Neck supple. No thyromegaly present.  Cardiovascular: Normal rate, regular rhythm and intact distal pulses.  No murmur heard. Pulmonary/Chest: Effort normal and breath sounds normal. No respiratory distress. He has no wheezes. He has no rales.  Abdominal: Soft. Bowel sounds are normal. He exhibits no distension and no mass. There is no tenderness. There is no rebound and no guarding.  Musculoskeletal: Normal range of motion. He exhibits no edema.  Lymphadenopathy:    He has no cervical adenopathy.  Neurological: He is alert and oriented to person, place, and time. He has normal reflexes. No cranial nerve deficit. Coordination normal.  Skin: Skin is dry. No rash noted. He is not diaphoretic. No erythema. No pallor.     LABORATORY DATA: I have personally reviewed the data as listed: Appointment on 05/28/2017  Component Date Value Ref Range Status  . Phosphorus 05/28/2017 4.4  2.5 - 4.6 mg/dL Final   Performed at [facility] [address] [place], [place] [address]  . Magnesium 05/28/2017 1.2* 1.5 - 2.5 mg/dL Final   Comment: Performed at [facility], [place] [address], [place], [place] [address] CRITICAL RESULT CALLED TO, READ BACK BY AND VERIFIED WITH: DR. Lebron Conners     .  Sodium 05/28/2017 125* 136 - 145 mmol/L Final  . Potassium 05/28/2017 4.0  3.5 - 5.1 mmol/L Final  . Chloride 05/28/2017 85* 98 - 109 mmol/L Final  . CO2 05/28/2017 28  22 - 29 mmol/L Final  . Glucose, Bld 05/28/2017 117  70 - 140 mg/dL Final  . BUN 05/28/2017 7  7 - 26 mg/dL Final  . Creatinine 05/28/2017 0.84  0.70 - 1.30 mg/dL Final  . Calcium 05/28/2017 10.6* 8.4 - 10.4 mg/dL Final  . Total Protein 05/28/2017 8.5* 6.4 - 8.3 g/dL Final  . Albumin 05/28/2017 3.7  3.5 - 5.0 g/dL Final  . AST 05/28/2017 25  5 - 34 U/L Final  . ALT 05/28/2017 25  0 - 55 U/L Final  . Alkaline Phosphatase 05/28/2017 103  40 - 150 U/L Final  . Total Bilirubin 05/28/2017 0.4  0.2 - 1.2 mg/dL Final  . GFR, Est Non Af Am 05/28/2017 >60  >60 mL/min Final  . GFR, Est AFR Am 05/28/2017 >60  >60 mL/min Final   Comment: (NOTE) The eGFR has been calculated using the CKD EPI equation. This calculation has not been validated in all clinical situations. eGFR's persistently <60 mL/min signify possible Chronic Kidney Disease.   Georgiann Hahn gap 05/28/2017 12* 3 - 11 Final   Performed at [facility], [place] [address] [place], [place] [address]  . WBC Count 05/28/2017 2.1* 4.0 - 10.3 K/uL Final  . RBC 05/28/2017 3.17* 4.20 - 5.82 MIL/uL Final  . Hemoglobin 05/28/2017 11.0* 13.0 - 17.1 g/dL Final  . HCT 05/28/2017 31.0* 38.4 - 49.9 % Final  . MCV 05/28/2017 97.8  79.3 - 98.0 fL Final  . MCH 05/28/2017 34.7* 27.2 - 33.4 pg Final  . MCHC 05/28/2017 35.5  32.0 - 36.0 g/dL Final  . RDW 05/28/2017 13.2  11.0 - 14.6 % Final  . Platelet Count 05/28/2017 277  140 - 400 K/uL Final  . Neutrophils Relative % 05/28/2017 79  %  Final  . Neutro Abs 05/28/2017 1.6  1.5 - 6.5 K/uL Final  . Lymphocytes Relative 05/28/2017 10  % Final  . Lymphs Abs 05/28/2017 0.2* 0.9 - 3.3 K/uL Final  . Monocytes Relative 05/28/2017 7  % Final  . Monocytes Absolute 05/28/2017 0.2  0.1 - 0.9 K/uL Final  . Eosinophils Relative  05/28/2017 1  % Final  . Eosinophils Absolute 05/28/2017 0.0  0.0 - 0.5 K/uL Final  . Basophils Relative 05/28/2017 3  % Final  . Basophils Absolute 05/28/2017 0.1  0.0 - 0.1 K/uL Final   Performed at [facility], [place] [address] [place], [place] [address]       Ardath Sax, MD

## 2017-06-16 NOTE — Assessment & Plan Note (Signed)
57 y.o.  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  Patient was supposed to be on systemic chemotherapy, but did not follow-up with any of the scheduled appointments in the beginning of the treatment necessitating discontinuation of the systemic therapy plan.  Later in the course of radiation, patient returned requesting initiation of systemic therapy, but my opinion, the benefit was minimal at that point and I did not proceed with any systemic treatment considering patient had less than a week left of radiation at that time.  Restaging imaging of neck and chest is suggestive of possible disease recurrence. Patient presents to the clinic today with complaints of oral and throat pain.  Examination confirms progressive swelling and likely progressive malignancy in the neck based on evaluation and palpation.  Presently, he is potential follow-up at [facility] and scheduled on 05/12/17 which is likely too far removed due to visibly progressive malignancy.  Patient is currently undergoing palliative-intent systemic chemotherapy with carboplatin and paclitaxel administered every 3 weeks.  Appears to be tolerating chemotherapy reasonably well despite the high dose of treatment administered.  Currently, no new complaints or significant annual laboratory abnormalities.  Continues to have significant hyponatremia without associated symptoms.  Plan: -Continue OxyContin/oxycodone combination for pain control. -Return to clinic in 2 weeks for the next cycle of systemic chemotherapy -Next scheduled disease assessment will occur after 3 cycles of systemic chemotherapy with CT of the neck and chest.

## 2017-06-16 NOTE — Progress Notes (Signed)
Mr. [place] presents for follow up of radiation completed 02/24/16 to his Left oral cavity and regional nodes in bilateral neck.    Pain issues, if any: He reports chronic left shoulder pain Using a feeding tube?: removed Weight changes, if any:  Wt Readings from Last 3 Encounters:  06/18/17 101 lb 6.4 oz (46 kg)  06/11/17 98 lb 4.8 oz (44.6 kg)  06/10/17 101 lb 11.2 oz (46.1 kg)   Swallowing issues, if any: He reports decreased appetite. He is eating softer food. He tells me that he is not eating much. He drinks 6 ensure daily.  Smoking or chewing tobacco? He denies Using fluoride trays daily? N/A Last ENT visit was on: Dr. Erik Obey 05/01/15 Other notable issues, if any:  Hospital admission 4/25- 4/29 abdominal pain and dehydration.   He has an appointment with Dr. Lebron Conners on 07/01/17 for office visit and infusion.  He will see Garald Balding on 06/21/17.  BP 96/67   Pulse (!) 128   Temp 98.6 F (37 C)   Resp (!) 22   Ht 5\' 7"  (1.702 m)   Wt 101 lb 6.4 oz (46 kg)   SpO2 95% Comment: room air  BMI 15.88 kg/m

## 2017-06-18 ENCOUNTER — Other Ambulatory Visit: Payer: Self-pay

## 2017-06-18 ENCOUNTER — Ambulatory Visit
Admission: RE | Admit: 2017-06-18 | Discharge: 2017-06-18 | Disposition: A | Discharge status: Home / Self Care | Payer: Medicaid Other | Source: Ambulatory Visit | Attending: Radiation Oncology | Admitting: Radiation Oncology

## 2017-06-18 ENCOUNTER — Encounter: Payer: Self-pay | Admitting: Radiation Oncology

## 2017-06-18 ENCOUNTER — Other Ambulatory Visit (HOSPITAL_COMMUNITY): Payer: Self-pay | Admitting: Radiology

## 2017-06-18 ENCOUNTER — Ambulatory Visit (HOSPITAL_COMMUNITY)
Admission: RE | Admit: 2017-06-18 | Discharge: 2017-06-18 | Disposition: A | Discharge status: Home / Self Care | Payer: Medicaid Other | Source: Ambulatory Visit | Attending: Radiology | Admitting: Radiology

## 2017-06-18 ENCOUNTER — Encounter: Payer: Self-pay | Admitting: Hematology and Oncology

## 2017-06-18 ENCOUNTER — Other Ambulatory Visit: Payer: Self-pay | Admitting: Hematology and Oncology

## 2017-06-18 VITALS — BP 96/67 | HR 128 | Temp 98.6°F | Resp 22 | Ht 67.0 in | Wt 101.4 lb

## 2017-06-18 DIAGNOSIS — R1310 Dysphagia, unspecified: Secondary | ICD-10-CM | POA: Insufficient documentation

## 2017-06-18 DIAGNOSIS — R633 Feeding difficulties, unspecified: Secondary | ICD-10-CM

## 2017-06-18 DIAGNOSIS — B370 Candidal stomatitis: Secondary | ICD-10-CM | POA: Insufficient documentation

## 2017-06-18 DIAGNOSIS — Z79899 Other long term (current) drug therapy: Secondary | ICD-10-CM | POA: Diagnosis not present

## 2017-06-18 DIAGNOSIS — M25512 Pain in left shoulder: Secondary | ICD-10-CM | POA: Diagnosis not present

## 2017-06-18 DIAGNOSIS — K9423 Gastrostomy malfunction: Secondary | ICD-10-CM | POA: Diagnosis not present

## 2017-06-18 DIAGNOSIS — Z923 Personal history of irradiation: Secondary | ICD-10-CM | POA: Diagnosis not present

## 2017-06-18 DIAGNOSIS — C062 Malignant neoplasm of retromolar area: Secondary | ICD-10-CM | POA: Diagnosis present

## 2017-06-18 DIAGNOSIS — J90 Pleural effusion, not elsewhere classified: Secondary | ICD-10-CM | POA: Insufficient documentation

## 2017-06-18 DIAGNOSIS — D649 Anemia, unspecified: Secondary | ICD-10-CM | POA: Diagnosis not present

## 2017-06-18 DIAGNOSIS — G8929 Other chronic pain: Secondary | ICD-10-CM | POA: Insufficient documentation

## 2017-06-18 DIAGNOSIS — C760 Malignant neoplasm of head, face and neck: Secondary | ICD-10-CM

## 2017-06-18 DIAGNOSIS — R109 Unspecified abdominal pain: Secondary | ICD-10-CM | POA: Insufficient documentation

## 2017-06-18 HISTORY — PX: IR REPLC GASTRO/COLONIC TUBE PERCUT W/FLUORO: IMG2333

## 2017-06-18 MED ORDER — IOPAMIDOL (ISOVUE-300) INJECTION 61%
10.0000 mL | Freq: Once | INTRAVENOUS | Status: AC | PRN
  Administered 2017-06-18: 10 mL

## 2017-06-18 MED ORDER — OXYCODONE HCL ER 10 MG PO T12A: 10.0000 mg | EXTENDED_RELEASE_TABLET | Freq: Two times a day (BID) | ORAL | 0 refills | Status: DC

## 2017-06-18 MED ORDER — LIDOCAINE VISCOUS 2 % MT SOLN
OROMUCOSAL | Status: AC | PRN
  Administered 2017-06-18: 15 mL via OROMUCOSAL

## 2017-06-18 MED ORDER — FLUCONAZOLE 100 MG PO TABS: ORAL_TABLET | ORAL | 0 refills | Status: AC

## 2017-06-18 MED ORDER — LIDOCAINE HCL 1 % IJ SOLN
INTRAMUSCULAR | Status: AC
  Filled 2017-06-18: qty 20

## 2017-06-18 MED ORDER — OXYCODONE HCL 10 MG PO TABS: 10.0000 mg | ORAL_TABLET | ORAL | 0 refills | Status: DC | PRN

## 2017-06-18 MED ORDER — LIDOCAINE-EPINEPHRINE (PF) 2 %-1:200000 IJ SOLN
INTRAMUSCULAR | Status: AC | PRN
  Administered 2017-06-18: 10 mL

## 2017-06-18 MED ORDER — IOPAMIDOL (ISOVUE-300) INJECTION 61%
INTRAVENOUS | Status: AC
  Filled 2017-06-18: qty 50

## 2017-06-18 MED ORDER — LIDOCAINE VISCOUS 2 % MT SOLN
OROMUCOSAL | Status: AC
  Filled 2017-06-18: qty 15

## 2017-06-18 MED ORDER — LIDOCAINE-EPINEPHRINE (PF) 2 %-1:200000 IJ SOLN
INTRAMUSCULAR | Status: AC
  Filled 2017-06-18: qty 20

## 2017-06-18 NOTE — Progress Notes (Signed)
Lawrence Arias presented to the office for follow up of his radiation treatment. Dr. Isidore Moos assessed a fistula or hole at his former PEG site with yellow drainage and bubbling present. She requested that I call interventional radiology to see if they would be willing to assess it. I spoke with Rowe Robert PA in interventional radiology and he agreed to have patient sent to them for evaluation. Rowe Robert also informed me that his original PEG was placed at [place] received a phone call from Rowe Robert at a later time after the  and he informed her that Dr. Pascal Lux examined [name] in interventional radiology and reinserted a PEG tube. He also believed that the skin irritation might be a scalded area.  I have informed Dr. Isidore Moos of this information. I will contact [name], head and neck navigator on Monday with this information.

## 2017-06-18 NOTE — Procedures (Signed)
Pre procedural Dx: Dysphagia, leaking from site of previously removed G-tube Post procedural Dx: Same  Successful fluoroscopic guided replacement of 16 Fr gastrostomy tube.   The feeding tube is ready for immediate use.  EBL: None  Complications: None immediate.  Ronny Bacon, MD Pager #: [phone]

## 2017-06-18 NOTE — Progress Notes (Signed)
Patient came in today and brought proof of income for one-time $400 [place].  Patient approved. He has a copy of the approval letter and would like to reserve funds for securing housing. Advised patient a copy of the lease agreement would be needed. He verbalized understanding and states he would get that to me as soon as possible once he was able to come up with the rest of the money. He has my name and number for any additional financial questions or concerns.

## 2017-06-18 NOTE — Progress Notes (Signed)
Radiation Oncology         [phone] ________________________________  Name: Lawrence Arias MRN: [id number]  Date: 06/18/2017  DOB: 04-Sep-1960  Follow-Up Visit Note  CC: System, Provider Not In  Melissa Montane, MD  Diagnosis and Prior Radiotherapy:       ICD-10-CM   1. 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 CT of the neck on 03/25/2017 showed a cavitary left submandibular mass measuring 3 cm, highly concerning for recurrent disease. The mass extends to the skin surface. There was also notable progression of soft tissue and submucosal edema in the left neck. CT of the chest showed new small pulmonary nodules: 3 mm subpleural left upper lobe nodule, 6 mm subpleural right lower lobe ground-glass nodule, and 5 mm left lower lobe ground-glass nodule.   He has been seeing Dr. Lebron Conners due to metastatic disease in his chest and locoregional recurrence. He started carboplatin and Taxol. After 2 cycles he was hospitalized due to hyponatremia and hypomagnesemia. He was discharged on 06/12/2017. His 3rd cycle of chemotherapy has been held. He has an appointment with Dr. Lebron Conners on 07/01/2017 for office visit and infusion.   Pain issues, if any: He reports chronic left shoulder pain.   Using a feeding tube?: Removed - but issues with stoma post removal - CT was done during admission of abdomen but patient was reassured after scan was  done. However, he states irritation over his abdomen continues  Weight changes, if any:  Wt Readings from Last 3 Encounters:  06/18/17 101 lb 6.4 oz (46 kg)  06/11/17 98 lb 4.8 oz (44.6 kg)  06/10/17 101 lb 11.2 oz (46.1 kg)   Swallowing issues, if any: He reports decreased appetite. He is eating softer food. He states that he is not eating much. He drinks 6 Ensure daily.   Smoking or chewing tobacco? He denies  Last ENT visit was on: 1/94/1740 with Dr. Erik Obey   Other notable issues, if any: He will see Garald Balding on 06/21/2017.  ALLERGIES:  has No Known Allergies.  Meds: Current Outpatient Medications  Medication Sig Dispense Refill  . acetaminophen (TYLENOL) 500 MG tablet Take 1,000 mg by mouth every 6 (six) hours as needed for moderate pain.    Marland Kitchen lisinopril (PRINIVIL,ZESTRIL) 20 MG tablet Take 20 mg by mouth daily.  0  . magnesium oxide (MAG-OX) 400 (241.3 Mg) MG tablet Take 1 tablet (400 mg total) by mouth 2 (two) times daily. 60 tablet 0  . ondansetron (ZOFRAN) 8 MG tablet Take 1 tablet (8 mg total) by mouth every 8 (eight) hours as needed for nausea. 30 tablet 0  . polyethylene glycol powder (MIRALAX) powder Use as directed for constipation. Follow directions on container. 850 g 3  . fluconazole (DIFLUCAN) 100 MG tablet Take 2 tablets today, then 1 tablet daily x 6 more days. 8 tablet 0  . oxyCODONE (OXYCONTIN) 10 mg 12 hr tablet Take  1 tablet (10 mg total) by mouth every 12 (twelve) hours for 14 days. 28 tablet 0  . oxyCODONE 10 MG TABS Take 1 tablet (10 mg total) by mouth every 4 (four) hours as needed for up to 14 days for severe pain. 50 tablet 0   No current facility-administered medications for this encounter.    Facility-Administered Medications Ordered in Other Encounters  Medication Dose Route Frequency Provider Last Rate Last Dose  . alteplase (CATHFLO ACTIVASE) injection 2 mg  2 mg Intracatheter Once PRN Perlov, Marinell Blight, MD      . heparin lock flush 100 unit/mL   250 Units Intracatheter PRN Perlov, Mikhail G, MD      . heparin lock flush 100 unit/mL  500 Units Intracatheter PRN Perlov, Marinell Blight, MD      . sodium chloride flush (NS) 0.9 % injection 10 mL  10 mL Intracatheter PRN Ardath Sax, MD        Physical Findings: Wt Readings from Last 3 Encounters:  06/18/17 101 lb 6.4 oz (46 kg)  06/11/17 98 lb 4.8 oz (44.6 kg)  06/10/17 101 lb 11.2 oz (46.1 kg)    height is 5\' 7"  (1.702 m) and weight is 101 lb 6.4 oz (46 kg). His temperature is 98.6 F (37 C). His blood pressure is 96/67 and his pulse is 128 (abnormal). His respiration is 22 (abnormal) and oxygen saturation is 95%.  General: Alert and oriented, in no acute distress. HEENT: His mouth is notable for thrush particularly over the mucosal graft in the left oral cavity.  Neck: He has multiple subcutaneous nodules in the upper left neck, and two that are posterior to his jaw are ulcerative and crusting. He has a trach in place. His jaw is swollen on the left side. On the right side of his neck, superior to the clavicle medially, there are some small subcutaneous nodules as well. There are additional small subcutaneous nodules in the right upper neck. He has a palpable mass that persists at the posterior to the angle of his mandible on the left. 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     Lab Findings: Lab Results  Component Value Date   WBC 5.4 06/11/2017   HGB 9.4 (L) 06/11/2017   HCT 25.6 (L) 06/11/2017   MCV 95.9 06/11/2017   PLT 324 06/11/2017      Radiographic Findings: Ct Abdomen W Contrast  Result Date: 06/11/2017 CLINICAL DATA:  Abdominal pain and chronic anemia. Remote history of throat cancer. Some pain and swelling around the G-tube site. EXAM: CT ABDOMEN WITH CONTRAST TECHNIQUE: Multidetector CT imaging of the  abdomen was performed using the standard protocol following bolus administration of intravenous contrast. CONTRAST:  70mL OMNIPAQUE IOHEXOL 300 MG/ML SOLN, 83mL ISOVUE-300 IOPAMIDOL (ISOVUE-300) INJECTION 61% COMPARISON:  None. FINDINGS: Lower chest: Small bilateral pleural effusions with minimal overlying atelectasis. There is fairly marked elevation of the left hemidiaphragm. No pericardial effusion. Hepatobiliary: No focal hepatic lesions or intrahepatic biliary dilatation. The gallbladder appears normal. Mild common bile duct dilatation. The common bile duct and head of the pancreas measures 7.3 mm. It tapers normally to the ampulla. Pancreas: No mass, inflammation or ductal dilatation. Spleen: Normal size.  No focal lesions. Adrenals/Urinary Tract: The adrenal glands and kidneys are unremarkable. Stomach/Bowel: The stomach, duodenum, visualized small bowel loops and visualized  colon are unremarkable. The gastrostomy tube has been removed. Slight thickening of the left rectus muscle and a small amount of air in the G-tube tract. No discrete abscess is identified. Mild gastric wall thickening in this area also. No obstruction or leaking oral contrast. Vascular/Lymphatic: Scattered atherosclerotic changes involving the aorta. The branch vessels are patent. The major venous structures are patent. No mesenteric or retroperitoneal mass or adenopathy. Other: No ascites or abdominal wall hernia. Musculoskeletal: No significant bony findings. IMPRESSION: 1. The feeding gastrostomy tube has been removed. There is a small amount of air in the tract but no discrete abscess. Mild thickening of the left rectus muscle and also the stomach wall. No leaking oral contrast is identified. 2. No acute abdominal findings, mass lesions or adenopathy. 3. Small bilateral pleural effusions. Electronically Signed   By: Marijo Sanes M.D.   On: 06/11/2017 20:23   Ir Replc Gastro/colonic Tube Percut W/fluoro  Result Date:  06/18/2017 INDICATION: History of head neck cancer with gastrostomy tube placed at outside institution. Patient states gastrostomy tube was removed approximately 3 weeks ago secondary to excessive leaking however the patient's gastrostomy track has leaked ever since. Patient presents today now to the interventional radiology clinic with leaking from gastrostomy tract. Patient states he has tolerated p.o. intake poorly and if gastrostomy tube could be replaced he would utilize it for enteric nutrition supplementation purposes. EXAM: FLUOROSCOPIC GUIDED REPLACEMENT OF GASTROSTOMY TUBE COMPARISON:  CT abdomen pelvis - 06/11/2017 MEDICATIONS: None. CONTRAST:  47mL ISOVUE-300 IOPAMIDOL (ISOVUE-300) INJECTION 61% administered into the gastric lumen FLUOROSCOPY TIME:  30 seconds (2.8 mGy) COMPLICATIONS: None immediate. PROCEDURE: Informed written consent was obtained from the patient after a discussion of the risks, benefits and alternatives to treatment. Questions regarding the procedure were encouraged and answered. A timeout was performed prior to the initiation of the procedure. The upper abdomen and site of the previous gastrostomy tube was prepped and draped in usual sterile fashion. Maximum barrier sterile technique with sterile gowns and gloves were used for the procedure. A timeout was performed prior to the initiation of the procedure. The track of the gastrostomy tube was injected with a small amount of contrast via the Christmas tree adapter. Next, with the use of a an Amplatz wire, a Kumpe catheter was advanced through the gastrostomy track to the level of the stomach. Contrast injection confirmed appropriate positioning. Next, over an Amplatz wire, a new 38 French balloon inflatable gastrostomy tube was advanced through the tract into the gastric lumen. The balloon was inflated and disc was cinched. Contrast was injected and several spot fluoroscopic images were obtained in various obliquities to confirm  appropriate intraluminal positioning. A dressing was placed. The patient tolerated the procedure well without immediate postprocedural complication. IMPRESSION: Successful fluoroscopic guided replacement of a new 16-French gastrostomy tube. The new gastrostomy tube is ready for immediate use. Electronically Signed   By: Sandi Mariscal M.D.   On: 06/18/2017 17:37   Dg Chest Port 1 View  Result Date: 06/10/2017 CLINICAL DATA:  Increased fatigue, history of tonsillar carcinoma with tracheostomy, throat pain EXAM: PORTABLE CHEST 1 VIEW COMPARISON:  CT chest of 03/25/2017 and chest x-ray of 05/21/2016 FINDINGS: The lungs remain clear and somewhat hyperaerated. No definite enlarging lung nodule is evident by chest x-ray. Right-sided Port-A-Cath is present with the tip overlying the lower SVC and tracheostomy is noted. Mediastinal and hilar contours are stable and heart size is within normal limits. Surgical clips overlie the left axilla. Old healed right rib  fractures are noted. IMPRESSION: 1. Hyperaeration.  No active lung disease. 2. Port-A-Cath tip overlies the lower SVC. 3. Tracheostomy is present. Electronically Signed   By: Ivar Drape M.D.   On: 06/10/2017 10:49    Impression/Plan:    1) Head and Neck Cancer Status: persistent progressive disease. Continue to follow with Dr. Lebron Conners for treatment of metastatic disease.   2) Nutritional Status: Weight stable. Drinking 6 Ensure daily. PEG tube: Removed. 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 We have sent him to interventional radiology.  3) Swallowing: He is eating softer foods.  SLP following  4) Oral Thrush: Prescription for fluconazole sent to patient's pharmacy ([facility] on [address]).  5) Follow-up prn. Continue close f/u with med/onc. The patient was encouraged to call with any issues or questions if needed. _____________________________________   Eppie Gibson,  MD  This document serves as a record of services personally performed by Eppie Gibson, MD. It was created on her behalf by Rae Lips, a trained medical scribe. The creation of this record is based on the scribe's personal observations and the provider's statements to them. This document has been checked and approved by the attending provider.

## 2017-06-20 ENCOUNTER — Encounter: Payer: Self-pay | Admitting: Radiation Oncology

## 2017-06-21 ENCOUNTER — Ambulatory Visit: Payer: Medicaid Other | Attending: Radiation Oncology

## 2017-06-21 DIAGNOSIS — R471 Dysarthria and anarthria: Secondary | ICD-10-CM | POA: Insufficient documentation

## 2017-06-21 DIAGNOSIS — R1310 Dysphagia, unspecified: Secondary | ICD-10-CM | POA: Insufficient documentation

## 2017-06-21 NOTE — Assessment & Plan Note (Signed)
57 y.o.  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  Patient was supposed to be on systemic chemotherapy, but did not follow-up with any of the scheduled appointments in the beginning of the treatment necessitating discontinuation of the systemic therapy plan.  Later in the course of radiation, patient returned requesting initiation of systemic therapy, but my opinion, the benefit was minimal at that point and I did not proceed with any systemic treatment considering patient had less than a week left of radiation at that time.  Restaging imaging of neck and chest is suggestive of possible disease recurrence. Patient presents to the clinic today with complaints of oral and throat pain.  Examination confirms progressive swelling and likely progressive malignancy in the neck based on evaluation and palpation.  Presently, he is potential follow-up at [facility] and scheduled on 05/12/17 which is likely too far removed due to visibly progressive malignancy.  Patient is currently undergoing palliative-intent systemic chemotherapy with carboplatin and paclitaxel administered every 3 weeks.  Today, lab work demonstrates severe hypomagnesemia and hyponatremia with sodium of 115.  No acute mental status changes.  Patient is likely unable to maintain himself on enteral nutrition or hydration adequately at this point in time.    Plan: - Refer patient to the emergency room for urgent assessment with likely subsequent admission to the hospital for evaluation and management.  Please see my consultation note for details.

## 2017-06-22 ENCOUNTER — Other Ambulatory Visit: Payer: Self-pay | Admitting: Radiation Oncology

## 2017-06-22 ENCOUNTER — Telehealth: Payer: Self-pay | Admitting: Medical Oncology

## 2017-06-22 DIAGNOSIS — C062 Malignant neoplasm of retromolar area: Secondary | ICD-10-CM

## 2017-06-22 NOTE — Telephone Encounter (Signed)
Tube feeding orders-Pt has new PEG tube and HHN asking what he should be given via PEG. I asked nurse for [facility] dietician to make recommendation and f/u with Dr Isidore Moos.

## 2017-06-25 NOTE — Telephone Encounter (Signed)
Opened by error.

## 2017-07-01 ENCOUNTER — Ambulatory Visit (HOSPITAL_COMMUNITY): Admission: RE | Admit: 2017-07-01 | Payer: Medicaid Other | Source: Ambulatory Visit

## 2017-07-01 ENCOUNTER — Ambulatory Visit (HOSPITAL_COMMUNITY)
Admission: RE | Admit: 2017-07-01 | Discharge: 2017-07-01 | Disposition: A | Discharge status: Home / Self Care | Payer: Medicaid Other | Source: Ambulatory Visit | Attending: Hematology and Oncology | Admitting: Hematology and Oncology

## 2017-07-01 ENCOUNTER — Inpatient Hospital Stay: Payer: Medicaid Other | Attending: Hematology and Oncology | Admitting: Hematology and Oncology

## 2017-07-01 ENCOUNTER — Inpatient Hospital Stay: Payer: Medicaid Other

## 2017-07-01 ENCOUNTER — Encounter: Payer: Self-pay | Admitting: Hematology and Oncology

## 2017-07-01 ENCOUNTER — Encounter: Payer: Self-pay | Admitting: *Deleted

## 2017-07-01 ENCOUNTER — Other Ambulatory Visit: Payer: Self-pay

## 2017-07-01 VITALS — BP 139/112 | HR 124 | Temp 97.7°F | Resp 40 | Ht 67.0 in | Wt 97.5 lb

## 2017-07-01 DIAGNOSIS — F17210 Nicotine dependence, cigarettes, uncomplicated: Secondary | ICD-10-CM | POA: Diagnosis not present

## 2017-07-01 DIAGNOSIS — R918 Other nonspecific abnormal finding of lung field: Secondary | ICD-10-CM | POA: Insufficient documentation

## 2017-07-01 DIAGNOSIS — I700 Atherosclerosis of aorta: Secondary | ICD-10-CM | POA: Diagnosis not present

## 2017-07-01 DIAGNOSIS — E871 Hypo-osmolality and hyponatremia: Secondary | ICD-10-CM

## 2017-07-01 DIAGNOSIS — C760 Malignant neoplasm of head, face and neck: Secondary | ICD-10-CM | POA: Diagnosis not present

## 2017-07-01 DIAGNOSIS — J432 Centrilobular emphysema: Secondary | ICD-10-CM | POA: Insufficient documentation

## 2017-07-01 DIAGNOSIS — C062 Malignant neoplasm of retromolar area: Secondary | ICD-10-CM

## 2017-07-01 DIAGNOSIS — Z681 Body mass index (BMI) 19 or less, adult: Secondary | ICD-10-CM | POA: Diagnosis not present

## 2017-07-01 DIAGNOSIS — J90 Pleural effusion, not elsewhere classified: Secondary | ICD-10-CM | POA: Diagnosis not present

## 2017-07-01 DIAGNOSIS — E861 Hypovolemia: Secondary | ICD-10-CM | POA: Diagnosis not present

## 2017-07-01 DIAGNOSIS — E876 Hypokalemia: Secondary | ICD-10-CM

## 2017-07-01 DIAGNOSIS — R634 Abnormal weight loss: Secondary | ICD-10-CM | POA: Diagnosis not present

## 2017-07-01 DIAGNOSIS — R0609 Other forms of dyspnea: Secondary | ICD-10-CM

## 2017-07-01 DIAGNOSIS — R64 Cachexia: Secondary | ICD-10-CM | POA: Diagnosis not present

## 2017-07-01 DIAGNOSIS — E43 Unspecified severe protein-calorie malnutrition: Secondary | ICD-10-CM | POA: Diagnosis not present

## 2017-07-01 DIAGNOSIS — G893 Neoplasm related pain (acute) (chronic): Secondary | ICD-10-CM

## 2017-07-01 DIAGNOSIS — Z5111 Encounter for antineoplastic chemotherapy: Secondary | ICD-10-CM

## 2017-07-01 DIAGNOSIS — R530 Neoplastic (malignant) related fatigue: Secondary | ICD-10-CM

## 2017-07-01 LAB — CBC WITH DIFFERENTIAL (CANCER CENTER ONLY)
BASOS ABS: 0 10*3/uL (ref 0.0–0.1)
Basophils Relative: 0 %
Eosinophils Absolute: 0.1 10*3/uL (ref 0.0–0.5)
Eosinophils Relative: 1 %
HCT: 32.1 % — ABNORMAL LOW (ref 38.4–49.9)
HEMOGLOBIN: 11.2 g/dL — AB (ref 13.0–17.1)
LYMPHS ABS: 0.7 10*3/uL — AB (ref 0.9–3.3)
Lymphocytes Relative: 7 %
MCH: 33.7 pg — AB (ref 27.2–33.4)
MCHC: 34.9 g/dL (ref 32.0–36.0)
MCV: 96.7 fL (ref 79.3–98.0)
MONOS PCT: 7 %
Monocytes Absolute: 0.7 10*3/uL (ref 0.1–0.9)
NEUTROS ABS: 9.2 10*3/uL — AB (ref 1.5–6.5)
NEUTROS PCT: 85 %
Platelet Count: 725 10*3/uL — ABNORMAL HIGH (ref 140–400)
RBC: 3.32 MIL/uL — AB (ref 4.20–5.82)
RDW: 13.6 % (ref 11.0–14.6)
WBC Count: 10.7 10*3/uL — ABNORMAL HIGH (ref 4.0–10.3)

## 2017-07-01 LAB — CMP (CANCER CENTER ONLY)
ALBUMIN: 3 g/dL — AB (ref 3.5–5.0)
ALK PHOS: 91 U/L (ref 40–150)
ALT: 11 U/L (ref 0–55)
AST: 15 U/L (ref 5–34)
Anion gap: 12 (ref 5–15)
BILIRUBIN TOTAL: 0.5 mg/dL (ref 0.2–1.2)
BUN: 7 mg/dL (ref 7–26)
CALCIUM: 12.1 mg/dL — AB (ref 8.4–10.4)
CO2: 25 mmol/L (ref 22–29)
Chloride: 80 mmol/L — ABNORMAL LOW (ref 98–109)
Creatinine: 0.73 mg/dL (ref 0.70–1.30)
GFR, Est AFR Am: >60 mL/min (ref >60)
GFR, Estimated: >60 mL/min (ref >60)
GLUCOSE: 100 mg/dL (ref 70–140)
POTASSIUM: 4.3 mmol/L (ref 3.5–5.1)
SODIUM: 117 mmol/L — AB (ref 136–145)
Total Protein: 8.1 g/dL (ref 6.4–8.3)

## 2017-07-01 LAB — MAGNESIUM: Magnesium: 1 mg/dL — CL (ref 1.7–2.4)

## 2017-07-01 LAB — PHOSPHORUS: Phosphorus: 3.9 mg/dL (ref 2.5–4.6)

## 2017-07-01 MED ORDER — OXYCODONE HCL ER 10 MG PO T12A
10.0000 mg | EXTENDED_RELEASE_TABLET | Freq: Two times a day (BID) | ORAL | 0 refills | Status: AC
Start: 2017-07-01 — End: 2017-07-15

## 2017-07-01 MED ORDER — IOPAMIDOL (ISOVUE-300) INJECTION 61%
100.0000 mL | Freq: Once | INTRAVENOUS | Status: AC | PRN
  Administered 2017-07-01: 75 mL via INTRAVENOUS

## 2017-07-01 MED ORDER — IOPAMIDOL (ISOVUE-300) INJECTION 61%
INTRAVENOUS | Status: AC
  Filled 2017-07-01: qty 100

## 2017-07-01 MED ORDER — HEPARIN SOD (PORK) LOCK FLUSH 100 UNIT/ML IV SOLN
INTRAVENOUS | Status: AC
  Filled 2017-07-01: qty 5

## 2017-07-01 MED ORDER — SODIUM CHLORIDE 0.9 % IV SOLN
4.0000 g | Freq: Once | INTRAVENOUS | Status: AC
  Administered 2017-07-01: 4 g via INTRAVENOUS
  Filled 2017-07-01: qty 8

## 2017-07-01 MED ORDER — OXYCODONE HCL 10 MG PO TABS
10.0000 mg | ORAL_TABLET | ORAL | 0 refills | Status: AC | PRN
Start: 2017-07-01 — End: 2017-07-15

## 2017-07-01 MED ORDER — SODIUM CHLORIDE 0.9 % IV SOLN
2000.0000 mL | Freq: Once | INTRAVENOUS | Status: AC
  Administered 2017-07-01: 1000 mL via INTRAVENOUS

## 2017-07-01 NOTE — Progress Notes (Signed)
Patient dc'd from infusion room after completing therapy and transported to CT scan by nurse tech.

## 2017-07-01 NOTE — Assessment & Plan Note (Signed)
57 y.o.  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  Patient was supposed to be on systemic chemotherapy, but did not follow-up with any of the scheduled appointments in the beginning of the treatment necessitating discontinuation of the systemic therapy plan.  Later in the course of radiation, patient returned requesting initiation of systemic therapy, but, as the benefit was deemed minimal at that point, it was not administered. Restaging imaging of neck and chest obtained subsequently demonstrated disease recurrence.  Based on recurrent disease, patient was started on palliative systemic therapy with carboplatin and paclitaxel after extensive deliberation.  Patient has received 2 cycles of systemic chemotherapy so far.  Following the last cycle, patient has developed progressive hyponatremia, malnutrition, and failure to thrive.  He was admitted to the hospital and improved with aggressive support.  Patient returns to the clinic today and appears to continue the downward trend with visible muscle mass loss, weakness, recurrent hypomagnesemia and severe hyponatremia.  His performance status is declining rapidly.  He is not a candidate for any further cytotoxic systemic therapy.  My preference would be to place patient in hospice care, but he is adamant about wanting to pursue continued cancer-directed therapies at this time.   Plan: - IV fluids today for hyponatremia/hypovolemia. - Patient will attempting to increase his nutritional intake to 6 cans of Jevity per day. - CT of the neck and chest today: If progressive disease is noted, I will reiterate my recommendation for hospice care as patient is unlikely to have physical reserves to tolerate progressive cancer and side effects of systemic therapy any further.  If cancer is improving, will consider transitioning patient to second line of therapy with immunotherapy such as pembrolizumab. -  Return to clinic in 1 week: Labs, clinic visit, possible initiation of immunotherapy.

## 2017-07-01 NOTE — Progress Notes (Signed)
Per Lebron Conners, MD patient needs an appointment to see him in the office the early part of next week. Scheduling message sent.

## 2017-07-01 NOTE — Patient Instructions (Signed)
Dehydration, Adult Dehydration is when there is not enough fluid or water in your body. This happens when you lose more fluids than you take in. Dehydration can range from mild to very bad. It should be treated right away to keep it from getting very bad. Symptoms of mild dehydration may include:  Thirst.  Dry lips.  Slightly dry mouth.  Dry, warm skin.  Dizziness. Symptoms of moderate dehydration may include:  Very dry mouth.  Muscle cramps.  Dark pee (urine). Pee may be the color of tea.  Your body making less pee.  Your eyes making fewer tears.  Heartbeat that is uneven or faster than normal (palpitations).  Headache.  Light-headedness, especially when you stand up from sitting.  Fainting (syncope). Symptoms of very bad dehydration may include:  Changes in skin, such as: ? Cold and clammy skin. ? Blotchy (mottled) or pale skin. ? Skin that does not quickly return to normal after being lightly pinched and let go (poor skin turgor).  Changes in body fluids, such as: ? Feeling very thirsty. ? Your eyes making fewer tears. ? Not sweating when body temperature is high, such as in hot weather. ? Your body making very little pee.  Changes in vital signs, such as: ? Weak pulse. ? Pulse that is more than 100 beats a minute when you are sitting still. ? Fast breathing. ? Low blood pressure.  Other changes, such as: ? Sunken eyes. ? Cold hands and feet. ? Confusion. ? Lack of energy (lethargy). ? Trouble waking up from sleep. ? Short-term weight loss. ? Unconsciousness. Follow these instructions at home:  If told by your doctor, drink an ORS: ? Make an ORS by using instructions on the package. ? Start by drinking small amounts, about  cup (120 mL) every 5-10 minutes. ? Slowly drink more until you have had the amount that your doctor said to have.  Drink enough clear fluid to keep your pee clear or pale yellow. If you were told to drink an ORS, finish the ORS  first, then start slowly drinking clear fluids. Drink fluids such as: ? Water. Do not drink only water by itself. Doing that can make the salt (sodium) level in your body get too low (hyponatremia). ? Ice chips. ? Fruit juice that you have added water to (diluted). ? Low-calorie sports drinks.  Avoid: ? Alcohol. ? Drinks that have a lot of sugar. These include high-calorie sports drinks, fruit juice that does not have water added, and soda. ? Caffeine. ? Foods that are greasy or have a lot of fat or sugar.  Take over-the-counter and prescription medicines only as told by your doctor.  Do not take salt tablets. Doing that can make the salt level in your body get too high (hypernatremia).  Eat foods that have minerals (electrolytes). Examples include bananas, oranges, potatoes, tomatoes, and spinach.  Keep all follow-up visits as told by your doctor. This is important. Contact a doctor if:  You have belly (abdominal) pain that: ? Gets worse. ? Stays in one area (localizes).  You have a rash.  You have a stiff neck.  You get angry or annoyed more easily than normal (irritability).  You are more sleepy than normal.  You have a harder time waking up than normal.  You feel: ? Weak. ? Dizzy. ? Very thirsty.  You have peed (urinated) only a small amount of very dark pee during 6-8 hours. Get help right away if:  You have symptoms of   very bad dehydration.  You cannot drink fluids without throwing up (vomiting).  Your symptoms get worse with treatment.  You have a fever.  You have a very bad headache.  You are throwing up or having watery poop (diarrhea) and it: ? Gets worse. ? Does not go away.  You have blood or something green (bile) in your throw-up.  You have blood in your poop (stool). This may cause poop to look black and tarry.  You have not peed in 6-8 hours.  You pass out (faint).  Your heart rate when you are sitting still is more than 100 beats a  minute.  You have trouble breathing. This information is not intended to replace advice given to you by your health care provider. Make sure you discuss any questions you have with your health care provider. Document Released: 11/29/2008 Document Revised: 08/23/2015 Document Reviewed: 03/29/2015 Elsevier Interactive Patient Education  2018 [employer]  

## 2017-07-01 NOTE — Progress Notes (Signed)
[place] [facility] Follow-up Visit:  Assessment: Squamous cell cancer of retromolar trigone ([place]) 57 y.o.  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  Patient was supposed to be on systemic chemotherapy, but did not follow-up with any of the scheduled appointments in the beginning of the treatment necessitating discontinuation of the systemic therapy plan.  Later in the course of radiation, patient returned requesting initiation of systemic therapy, but, as the benefit was deemed minimal at that point, it was not administered. Restaging imaging of neck and chest obtained subsequently demonstrated disease recurrence.  Based on recurrent disease, patient was started on palliative systemic therapy with carboplatin and paclitaxel after extensive deliberation.  Patient has received 2 cycles of systemic chemotherapy so far.  Following the last cycle, patient has developed progressive hyponatremia, malnutrition, and failure to thrive.  He was admitted to the hospital and improved with aggressive support.  Patient returns to the clinic today and appears to continue the downward trend with visible muscle mass loss, weakness, recurrent hypomagnesemia and severe hyponatremia.  His performance status is declining rapidly.  He is not a candidate for any further cytotoxic systemic therapy.  My preference would be to place patient in hospice care, but he is adamant about wanting to pursue continued cancer-directed therapies at this time.   Plan: - IV fluids today for hyponatremia/hypovolemia. - Patient will attempting to increase his nutritional intake to 6 cans of Jevity per day. - CT of the neck and chest today: If progressive disease is noted, I will reiterate my recommendation for hospice care as patient is unlikely to have physical reserves to tolerate progressive cancer and side effects of systemic therapy any further.  If cancer is  improving, will consider transitioning patient to second line of therapy with immunotherapy such as pembrolizumab. - Return to clinic in 1 week: Labs, clinic visit, possible initiation of immunotherapy.  Voice recognition software was used and creation of this note. Despite my best effort at editing the text, some misspelling/errors may have occurred.  Orders Placed This Encounter  Procedures  . DG Chest 2 View    Standing Status:   Future    Number of Occurrences:   1    Standing Expiration Date:   07/01/2018    Order Specific Question:   Reason for Exam (SYMPTOM  OR DIAGNOSIS REQUIRED)    Answer:   New dyspnea on exertion    Order Specific Question:   Preferred imaging location?    Answer:   [facility]    Order Specific Question:   Radiology Contrast Protocol - do NOT remove file path    Answer:   \\[email or web address]  . CT Soft Tissue Neck W Contrast    Standing Status:   Future    Standing Expiration Date:   07/01/2018    Order Specific Question:   If indicated for the ordered procedure, I authorize the administration of contrast media per Radiology protocol    Answer:   Yes    Order Specific Question:   Preferred imaging location?    Answer:   [facility]    Order Specific Question:   Call Results- Best Contact Number?    Answer:   [phone] Do not Hold    Order Specific Question:   Radiology Contrast Protocol - do NOT remove file path    Answer:   \\[email or web address]    Order Specific Question:   Reason for Exam additional comments  Answer:   Metastatic head and neck SCC, on treatment -- please eval interval change  . CT Chest W Contrast    Standing Status:   Future    Standing Expiration Date:   07/01/2018    Order Specific Question:   If indicated for the ordered procedure, I authorize the administration of contrast media per Radiology protocol    Answer:   Yes    Order Specific Question:    Preferred imaging location?    Answer:   [facility]    Order Specific Question:   Radiology Contrast Protocol - do NOT remove file path    Answer:   \\[email or web address]    Order Specific Question:   Reason for Exam additional comments    Answer:   Metastatic head /neck SCC please eval interval disease change on therapy  . CBC with Differential (Cancer Center Only)    Standing Status:   Future    Standing Expiration Date:   07/02/2018  . CMP ([place] only)    Standing Status:   Future    Standing Expiration Date:   07/02/2018  . TSH    Standing Status:   Future    Standing Expiration Date:   07/02/2018  . Magnesium    Standing Status:   Future    Standing Expiration Date:   07/01/2018  . Phosphorus    Standing Status:   Future    Standing Expiration Date:   07/01/2018  . EKG 12-Lead    Cancer Staging Oral cancer [facility]) Staging form: Oral Cavity, AJCC 8th Edition - Clinical: Stage III (cT3, cN1, cM0) - Signed by Eppie Gibson, MD on 08/25/2016  Squamous cell cancer of retromolar trigone ([place]) Staging form: Oral Cavity, AJCC 8th Edition - Pathologic stage from 10/16/2016: Stage IVB (pT2, pN3b, cM0) - Unsigned   All questions were answered.  . The patient knows to call the clinic with any problems, questions or concerns.  This note was electronically signed.    History of Presenting Illness Lawrence Arias is a 57 y.o. followed in the [facility] for diagnosis of squamous cell carcinoma of the left retromolar trigone. Since the diagnosis in Jul 2018, patient underwent surgery on 10/16/16 including graft tissue placement. Recovered well from the operation, and he is referred to medical oncology for possible systemic chemotherapy as an adjunctive for the adjuvant radiation therapy. His primary ENT is Dr. Janace Hoard and Radiation Oncologist is Dr Isidore Moos.  Due to concerns for residual/recurrent disease in the surgical bed, patient was recommended to  undergo chemoradiotherapy and was planned for the treatment in mid October 2018.  Nevertheless, he did not show up for several appointments with our clinic and eventually we made decision to forego chemotherapy.  Patient is nearing completion of his radiation therapy and tolerated it quite well so far.   Patient returns to the clinic for toxicity monitoring while receiving systemic palliative systemic therapy with carboplatin and paclitaxel.  Patient continues to lose weight.  Reports only taking 3 cans of Ensure 4 out of 6 recommended by our nutrition specialist.  Tries to supplement with cereal and cream of wheat.  Activity level at this time is minimal, patient is unable to take care of any of the activities of daily living of his own.  Today, complains of shortness of breath with any exertion.  Denies shortness of breath or chest pain at rest.  Oncological/hematological History:   Squamous cell cancer of retromolar trigone ([place])   07/24/2016 Initial  Diagnosis    Squamous cell cancer of retromolar trigone [facility]): Initial biopsy demonstrating moderately differentiated invasive squamous cell carcinoma.       08/14/2016 Imaging    CT neck: Nonvisualization of left retromolar trigone mucosal lesion. No evidence of invasion into the adjacent structures. 2. Left level IIa lymph node measuring 1 cm, likely indicating spread of disease. No contralateral adenopathy.      09/29/2016 Imaging    CT Neck [facility]): Left-sided pathological lymphadenopathy of metastatic disease at level IB, IIA. Possible involvement of level IA, level III, and level IV on the left side.      10/16/2016 Surgery    Dr Harriett Rush [facility] [place]  [facility]): LEFT TONSIL, SOFT PALATE, FLOOR OF MOUTH, BASE OF TONGUE, AND MANDIBLE, PARTIAL GLOSSOPHARYNGECTOMY AND PARTIAL MANDIBULECTOMY, LEFT PARAPHARYNGEAL MASS, EXCISION, TEETH, REMOVAL       10/16/2016 Pathology Results    Surgical Pathology:  LEFT TONSIL, SOFT PALATE, FLOOR OF  MOUTH, BASE OF TONGUE, AND MANDIBLE, PARTIAL GLOSSOPHARYNGECTOMY AND PARTIAL MANDIBULECTOMY: Invasive squamous cell carcinoma, keratinizing type, moderately differentiated, is identified forming a 3.7x1.8x1.0cm mass in the left retromolar area.Tumor invades to a depth of 0.8 cm.Lymphovascular invasion is identified. Perineural invasion is identified. The medial soft tissue margin is positive for invasive squamous cell carcinoma (focal). LEFT PARAPHARYNGEAL MASS, EXCISION: Invasive squamous cell carcinoma forming a 1.2x1.2x0.5cm mass. Invasive carcinoma involves the unoriented soft tissue margin over a linear length of 0.3 cm. TEETH, REMOVAL: Two teeth (gross examination only).  Margins: "TUMOR NEXT TO BONE", EXCISION: Scant fragments of bone and dense fibrous tissue, negative for carcinoma. "PHARYNGEAL WALL MARGIN", EXCISION: Squamous mucosa with acute inflammation and skeletal muscle, negative for carcinoma. "HARD PALATE MARGIN", EXCISION: Squamous mucosa, negative for carcinoma. "TONGUE BASE MARGIN", EXCISION: Squamous mucosa with chronic inflammation, negative for carcinoma. "INFERIOR BUCCAL MUCOSA MARGIN", EXCISION: Squamous mucosa and skeletal muscle, negative for carcinoma. "SUPERIOR BUCCAL MUCOSAL MARGIN", EXCISION: Squamous mucosa with chronic inflammation, negative for carcinoma.  Lymph nodes:  LEVEL 1A NECK CONTENTS, LYMPH NODE DISSECTION: Three lymph nodes, negative for metastatic carcinoma (0/3). LEFT LEVEL 1B NECK CONTENTS, LYMPH NODE DISSECTION: Metastatic carcinoma in two of five lymph nodes (2/5). Largest metastatic deposit measures 2.4 cm. Definitive extranodal extension is not identified. Salivary gland parenchyma, negative for tumor. LEFT LEVEL 2B NECK CONTENTS, LYMPH NODE DISSECTION: Eight lymph nodes, negative for metastatic carcinoma (0/8). LEFT LEVEL 2A, 3, 4, AND 5 NECK CONTENTS, LYMPH NODE DISSECTION: Metastatic carcinoma in one of twenty-two lymph nodes (1/22). Largest  metastatic deposit measures 3.5 cm. Extranodal extension is present and measures 1.5 mm beyond the lymph node capsule.  COMMENT: Immunohistochemical staining for p16 (Block A4) shows the invasive carcinoma is diffusely positive for p16.The positive control stains appropriately.      11/04/2016 Imaging    CT Neck [facility]): No pathological lymphadenopathy. Surgical changes noted. Significant fluid collection in the left upper neck with internal gas and significant soft tissue swelling.      11/30/2016 Imaging    CT Neck: Postop resection of retromolar trigone carcinoma on the left with free flap. Postsurgical changes left neck without evidence of recurrent tumor.   CT Chest: No suspicious pulmonary nodules or masses identified. No thoracic adenopathy.      12/31/2016 - 02/23/2017 Radiation Therapy    Left oral cavity and regional nodes: --66Gy/33 fx        03/25/2017 Relapse/Recurrence    CT Neck & Chest: Likely recurrent disease in the neck with left submandibular mass measuring 3.0  cm.  Multiple new small pulmonary nodules worrisome for possible metastatic disease.      04/30/2017 -  Chemotherapy    Palliative systemic chemotherapy: Carboplatin AUC 6, d1 + Paclitaxel '225mg'$ /m2, d1 Q21d --Cycle #1, 04/30/17: --Cycle #2, 15/40/08: Complicated by progressive hyponatremia (Grade 4), hypomagnesemia (Grade 3) --Cycle #3, ...: Delayed due to complications of the previous cycle.       07/08/2017 -  Chemotherapy    The patient had pembrolizumab (KEYTRUDA) 200 mg in sodium chloride 0.9 % 50 mL chemo infusion, 200 mg, Intravenous, Once, 0 of 6 cycles  for chemotherapy treatment.        Oral cancer ([place])   08/25/2016 Initial Diagnosis    Oral cancer ([place])      07/08/2017 -  Chemotherapy    The patient had pembrolizumab (KEYTRUDA) 200 mg in sodium chloride 0.9 % 50 mL chemo infusion, 200 mg, Intravenous, Once, 0 of 6 cycles  for chemotherapy treatment.        Malignant neoplasm of  head, face and neck ([place])   07/01/2017 Initial Diagnosis    Malignant neoplasm of head, face and neck ([place])      07/08/2017 -  Chemotherapy    The patient had pembrolizumab (KEYTRUDA) 200 mg in sodium chloride 0.9 % 50 mL chemo infusion, 200 mg, Intravenous, Once, 0 of 6 cycles  for chemotherapy treatment.        Medical History: Past Medical History:  Diagnosis Date  . Cancer ([place])   . GERD (gastroesophageal reflux disease)    uses OTC prn  . History of radiation therapy 12/31/16- 02/23/2017   Left oral cavity and regional nodes, 66 Gy in 33 fractions.   . Hypertension   . Tongue lesion     Surgical History: Past Surgical History:  Procedure Laterality Date  . DIRECT LARYNGOSCOPY N/A 07/24/2016   Procedure: DIRECT LARYNGOSCOPY;  Surgeon: Melissa Montane, MD;  Location: [facility];  Service: ENT;  Laterality: N/A;  Direct laryngoscopy with biopsy tongue lesion  . FRACTURE SURGERY     left wrist, remote  . FREE FLAP RADIAL FOREARM Left 10/16/2016   Dr. Hendricks Limes -[facility]  . free flap scapular Left 10/16/2016   Dr. Hendricks Limes - [facility] hospital  . GASTROSTOMY  10/27/2016   Dr. Daphene Calamity- [facility].   . IR FLUORO GUIDE PORT INSERTION RIGHT  12/22/2016  . IR [place] TUBE PERCUT W/FLUORO  06/18/2017  . IR US GUIDE VASC ACCESS RIGHT  12/22/2016  . NECK DISSECTION Bilateral 10/16/2016   Selective neck dissection, Dr. Hendricks Limes [facility]  . 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 Hendricks Limes [facility]  . SKIN GRAFT Left 10/16/2016   Split thickness skin graft, leg Dr. Hendricks Limes. [facility]  . TONGUE BIOPSY  07/24/2016   Procedure: TONGUE BIOPSY;  Surgeon: Melissa Montane, MD;  Location: [facility];  Service: ENT;;  . TRACHEOSTOMY  10/16/2016   Dr. Hendricks Limes [facility]  . TRACHEOSTOMY TUBE PLACEMENT N/A 04/29/2017   Procedure: TRACHEOSTOMY REVISION;  Surgeon: Jodi Marble, MD;  Location: [facility] OR;  Service: ENT;  Laterality:  N/A;    Family History: Family History  Problem Relation Age of Onset  . Hypertension Mother   . Hypertension Father     Social History: Social History   Socioeconomic History  . Marital status: Single    Spouse name: Not on file  . Number of children: 3  . Years of education:  Not on file  . Highest education level: Not on file  Occupational History  . Not on file  Social Needs  . Financial resource strain: Not on file  . Food insecurity:    Worry: Not on file    Inability: Not on file  . Transportation needs:    Medical: Not on file    Non-medical: Not on file  Tobacco Use  . Smoking status: Current Some Day Smoker    Packs/day: 0.50    Types: Cigarettes  . Smokeless tobacco: Never Used  . Tobacco comment: he is smoking 0ne every other day (11/20/16)  Substance and Sexual Activity  . Alcohol use: Not on file  . Drug use: No  . Sexual activity: Not on file  Lifestyle  . Physical activity:    Days per week: Not on file    Minutes per session: Not on file  . Stress: Not on file  Relationships  . Social connections:    Talks on phone: Not on file    Gets together: Not on file    Attends religious service: Not on file    Active member of club or organization: Not on file    Attends meetings of clubs or organizations: Not on file    Relationship status: Not on file  . Intimate partner violence:    Fear of current or ex partner: Not on file    Emotionally abused: Not on file    Physically abused: Not on file    Forced sexual activity: Not on file  Other Topics Concern  . Not on file  Social History Narrative  . Not on file    Allergies: No Known Allergies  Medications:  Current Outpatient Medications  Medication Sig Dispense Refill  . acetaminophen (TYLENOL) 500 MG tablet Take 1,000 mg by mouth every 6 (six) hours as needed for moderate pain.    . fluconazole (DIFLUCAN) 100 MG tablet Take 2 tablets today, then 1 tablet daily x 6 more days. 8 tablet 0  .  lisinopril (PRINIVIL,ZESTRIL) 20 MG tablet Take 20 mg by mouth daily.  0  . magnesium oxide (MAG-OX) 400 (241.3 Mg) MG tablet Take 1 tablet (400 mg total) by mouth 2 (two) times daily. 60 tablet 0  . ondansetron (ZOFRAN) 8 MG tablet Take 1 tablet (8 mg total) by mouth every 8 (eight) hours as needed for nausea. 30 tablet 0  . oxyCODONE (OXYCONTIN) 10 mg 12 hr tablet Take 1 tablet (10 mg total) by mouth every 12 (twelve) hours for 14 days. 28 tablet 0  . Oxycodone HCl 10 MG TABS Take 1 tablet (10 mg total) by mouth every 4 (four) hours as needed for up to 14 days. 50 tablet 0  . polyethylene glycol powder (MIRALAX) powder Use as directed for constipation. Follow directions on container. 850 g 3   No current facility-administered medications for this visit.    Facility-Administered Medications Ordered in Other Visits  Medication Dose Route Frequency Provider Last Rate Last Dose  . alteplase (CATHFLO ACTIVASE) injection 2 mg  2 mg Intracatheter Once PRN Mikhai Bienvenue, Marinell Blight, MD      . heparin lock flush 100 unit/mL  250 Units Intracatheter PRN Christapher Gillian G, MD      . heparin lock flush 100 unit/mL  500 Units Intracatheter PRN Genene Kilman, Marinell Blight, MD      . sodium chloride flush (NS) 0.9 % injection 10 mL  10 mL Intracatheter PRN Lebron Conners, Marinell Blight, MD  Review of Systems: Review of Systems  Constitutional: Positive for fatigue.  HENT:   Positive for lump/mass, sore throat and trouble swallowing.   Respiratory: Positive for shortness of breath. Negative for hemoptysis.   All other systems reviewed and are negative.    PHYSICAL EXAMINATION Blood pressure (!) 139/112, pulse (!) 124, temperature 97.7 F (36.5 C), temperature source Oral, resp. rate (!) 40, height '5\' 7"'$  (1.702 m), weight 97 lb 8 oz (44.2 kg), SpO2 95 %.  ECOG PERFORMANCE STATUS: 3 - Symptomatic, >50% confined to bed  Physical Exam  Constitutional: He is oriented to person, place, and time and well-developed,  well-nourished, and in no distress. No distress.  Appears weak and tired.  Interval visible muscle atrophy with severe bitemporal muscle wasting.  HENT:  Mouth/Throat: Improve left facial and cervical swelling with stable palpable masses in the left neck including several dermal lesions with epidermal ulceration.  No current erythema or purulent drainage noted. Tracheostomy in place with thick nonpurulent secretions.  Eyes: Pupils are equal, round, and reactive to light. EOM are normal. No scleral icterus.  Neck: Normal range of motion. Neck supple. No thyromegaly present.  Cardiovascular: Normal rate, regular rhythm and intact distal pulses.  No murmur heard. Pulmonary/Chest: Effort normal and breath sounds normal. No respiratory distress. He has no wheezes. He has no rales.  Abdominal: Soft. Bowel sounds are normal. He exhibits no distension and no mass. There is no tenderness. There is no rebound and no guarding.  Musculoskeletal: Normal range of motion. He exhibits no edema.  Lymphadenopathy:    He has no cervical adenopathy.  Neurological: He is alert and oriented to person, place, and time. He has normal reflexes. No cranial nerve deficit. Coordination normal.  Skin: Skin is dry. No rash noted. He is not diaphoretic. No erythema. No pallor.     LABORATORY DATA: I have personally reviewed the data as listed: Appointment on 07/01/2017  Component Date Value Ref Range Status  . Phosphorus 07/01/2017 3.9  2.5 - 4.6 mg/dL Final   Performed at [facility] [address], [place], [place] [address]  . Magnesium 07/01/2017 1.0* 1.7 - 2.4 mg/dL Final   Comment: Performed at [facility], [place] [address], [place], [place] [address] CRITICAL RESULT CALLED TO, READ BACK BY AND VERIFIED WITH: Jefm Miles at [phone] by Prudencio Burly    . Sodium 07/01/2017 117* 136 - 145 mmol/L Final   Not [facility] established panic value   . Potassium 07/01/2017 4.3  3.5 - 5.1  mmol/L Final  . Chloride 07/01/2017 80* 98 - 109 mmol/L Final  . CO2 07/01/2017 25  22 - 29 mmol/L Final  . Glucose, Bld 07/01/2017 100  70 - 140 mg/dL Final  . BUN 07/01/2017 7  7 - 26 mg/dL Final  . Creatinine 07/01/2017 0.73  0.70 - 1.30 mg/dL Final  . Calcium 07/01/2017 12.1* 8.4 - 10.4 mg/dL Final  . Total Protein 07/01/2017 8.1  6.4 - 8.3 g/dL Final  . Albumin 07/01/2017 3.0* 3.5 - 5.0 g/dL Final  . AST 07/01/2017 15  5 - 34 U/L Final  . ALT 07/01/2017 11  0 - 55 U/L Final  . Alkaline Phosphatase 07/01/2017 91  40 - 150 U/L Final  . Total Bilirubin 07/01/2017 0.5  0.2 - 1.2 mg/dL Final  . GFR, Est Non Af Am 07/01/2017 >60  >60 mL/min Final  . GFR, Est AFR Am 07/01/2017 >60  >60 mL/min Final   Comment: (  NOTE) The eGFR has been calculated using the CKD EPI equation. This calculation has not been validated in all clinical situations. eGFR's persistently <60 mL/min signify possible Chronic Kidney Disease.   Georgiann Hahn gap 07/01/2017 12  5 - 15 Final   Performed at [facility], [place] [address] [place], [place] [address]  . WBC Count 07/01/2017 10.7* 4.0 - 10.3 K/uL Final  . RBC 07/01/2017 3.32* 4.20 - 5.82 MIL/uL Final  . Hemoglobin 07/01/2017 11.2* 13.0 - 17.1 g/dL Final  . HCT 07/01/2017 32.1* 38.4 - 49.9 % Final  . MCV 07/01/2017 96.7  79.3 - 98.0 fL Final  . MCH 07/01/2017 33.7* 27.2 - 33.4 pg Final  . MCHC 07/01/2017 34.9  32.0 - 36.0 g/dL Final  . RDW 07/01/2017 13.6  11.0 - 14.6 % Final  . Platelet Count 07/01/2017 725* 140 - 400 K/uL Final  . Smear Review 07/01/2017 Few Variant lymphs    Final  . Neutrophils Relative % 07/01/2017 85  % Final  . Neutro Abs 07/01/2017 9.2* 1.5 - 6.5 K/uL Final  . Lymphocytes Relative 07/01/2017 7  % Final  . Lymphs Abs 07/01/2017 0.7* 0.9 - 3.3 K/uL Final  . Monocytes Relative 07/01/2017 7  % Final  . Monocytes Absolute 07/01/2017 0.7  0.1 - 0.9 K/uL Final  . Eosinophils Relative 07/01/2017 1  % Final  .  Eosinophils Absolute 07/01/2017 0.1  0.0 - 0.5 K/uL Final  . Basophils Relative 07/01/2017 0  % Final  . Basophils Absolute 07/01/2017 0.0  0.0 - 0.1 K/uL Final   Performed at [facility], [place] [address] [place], [place] [address]       Ardath Sax, MD

## 2017-07-02 ENCOUNTER — Telehealth: Payer: Self-pay

## 2017-07-02 ENCOUNTER — Other Ambulatory Visit: Payer: Self-pay | Admitting: Hematology and Oncology

## 2017-07-02 NOTE — Telephone Encounter (Signed)
Printed avs and calender of upcoming appointment. Per 5/16 sch msg

## 2017-07-05 ENCOUNTER — Ambulatory Visit: Payer: Medicaid Other

## 2017-07-05 ENCOUNTER — Telehealth: Payer: Self-pay

## 2017-07-05 ENCOUNTER — Encounter: Payer: Self-pay | Admitting: *Deleted

## 2017-07-05 ENCOUNTER — Telehealth: Payer: Self-pay | Admitting: *Deleted

## 2017-07-05 ENCOUNTER — Other Ambulatory Visit: Payer: Self-pay | Admitting: Hematology and Oncology

## 2017-07-05 DIAGNOSIS — Z923 Personal history of irradiation: Secondary | ICD-10-CM

## 2017-07-05 DIAGNOSIS — Z66 Do not resuscitate: Secondary | ICD-10-CM | POA: Diagnosis present

## 2017-07-05 DIAGNOSIS — Y95 Nosocomial condition: Secondary | ICD-10-CM | POA: Diagnosis present

## 2017-07-05 DIAGNOSIS — B9562 Methicillin resistant Staphylococcus aureus infection as the cause of diseases classified elsewhere: Secondary | ICD-10-CM | POA: Diagnosis present

## 2017-07-05 DIAGNOSIS — E43 Unspecified severe protein-calorie malnutrition: Secondary | ICD-10-CM | POA: Diagnosis present

## 2017-07-05 DIAGNOSIS — C062 Malignant neoplasm of retromolar area: Secondary | ICD-10-CM | POA: Diagnosis present

## 2017-07-05 DIAGNOSIS — J9621 Acute and chronic respiratory failure with hypoxia: Secondary | ICD-10-CM | POA: Diagnosis present

## 2017-07-05 DIAGNOSIS — Z515 Encounter for palliative care: Secondary | ICD-10-CM | POA: Diagnosis present

## 2017-07-05 DIAGNOSIS — Z681 Body mass index (BMI) 19 or less, adult: Secondary | ICD-10-CM

## 2017-07-05 DIAGNOSIS — Z85818 Personal history of malignant neoplasm of other sites of lip, oral cavity, and pharynx: Secondary | ICD-10-CM

## 2017-07-05 DIAGNOSIS — J910 Malignant pleural effusion: Secondary | ICD-10-CM | POA: Diagnosis present

## 2017-07-05 DIAGNOSIS — Z9981 Dependence on supplemental oxygen: Secondary | ICD-10-CM

## 2017-07-05 DIAGNOSIS — D638 Anemia in other chronic diseases classified elsewhere: Secondary | ICD-10-CM | POA: Diagnosis present

## 2017-07-05 DIAGNOSIS — K219 Gastro-esophageal reflux disease without esophagitis: Secondary | ICD-10-CM | POA: Diagnosis present

## 2017-07-05 DIAGNOSIS — E871 Hypo-osmolality and hyponatremia: Secondary | ICD-10-CM | POA: Diagnosis present

## 2017-07-05 DIAGNOSIS — I10 Essential (primary) hypertension: Secondary | ICD-10-CM | POA: Diagnosis present

## 2017-07-05 DIAGNOSIS — J690 Pneumonitis due to inhalation of food and vomit: Principal | ICD-10-CM | POA: Diagnosis present

## 2017-07-05 DIAGNOSIS — Z930 Tracheostomy status: Secondary | ICD-10-CM

## 2017-07-05 DIAGNOSIS — R471 Dysarthria and anarthria: Secondary | ICD-10-CM

## 2017-07-05 DIAGNOSIS — C760 Malignant neoplasm of head, face and neck: Secondary | ICD-10-CM

## 2017-07-05 DIAGNOSIS — F17210 Nicotine dependence, cigarettes, uncomplicated: Secondary | ICD-10-CM | POA: Diagnosis present

## 2017-07-05 DIAGNOSIS — Z9221 Personal history of antineoplastic chemotherapy: Secondary | ICD-10-CM

## 2017-07-05 DIAGNOSIS — D539 Nutritional anemia, unspecified: Secondary | ICD-10-CM | POA: Diagnosis present

## 2017-07-05 DIAGNOSIS — Z931 Gastrostomy status: Secondary | ICD-10-CM

## 2017-07-05 DIAGNOSIS — L8990 Pressure ulcer of unspecified site, unspecified stage: Secondary | ICD-10-CM | POA: Diagnosis present

## 2017-07-05 DIAGNOSIS — Z79899 Other long term (current) drug therapy: Secondary | ICD-10-CM

## 2017-07-05 DIAGNOSIS — R1310 Dysphagia, unspecified: Secondary | ICD-10-CM

## 2017-07-05 NOTE — Telephone Encounter (Signed)
Oncology Nurse Navigator Documentation  Left separate VMMs for Lawrence Arias and his dtr, [name], reminding of today's 2:00 SLP appt and new appt 5/23 9:40 to see Dr. Lebron Conners with his request for family attendance.  Requested return call to verify message receipt.  [name], RN, BSN Head & Neck Oncology Nurse [place] at [place] [phone]

## 2017-07-05 NOTE — Telephone Encounter (Signed)
Oncology Nurse Navigator Documentation  Pt's dtr returned my call, confirmed understanding of appts per my earlier VMM.  [name], RN, BSN Head & Neck Oncology Nurse [place] at [place] [phone]

## 2017-07-05 NOTE — Progress Notes (Signed)
Oncology Nurse Navigator Documentation  Met with Lawrence Arias during unscheduled appt with Dr. Lebron Conners. He reported he is not using PEG b/c does not have supplies.  He stated goal is 7 bottles of supplement daily. He agreed with Dr. Clydene Laming recommendation to discontinue chemotherapy, enroll with hospice. He voiced understanding Dr. Lebron Conners wishes to have further discussion with him and family during Thursday's appt. Interventions:  I provided 2 syringes and box of split gauze.  I demonstrated bolus instillation of water, he chose not to provide return demonstration but voiced understanding.  He stated he has help at home for feedings.  I explained process for cleaning PEG site, dressing with single split gauze.    I discussed hospice with him, explained their program of support. I encouraged him to call me with questions/concerns.  [name], RN, BSN Head & Neck Oncology Nurse [place] at [place] [phone]

## 2017-07-05 NOTE — Therapy (Signed)
[facility] [address] [place], [place], [address] Phone: [phone]   Fax:  [phone]  Patient Details  Name: Lawrence Arias MRN: [id number] Date of Birth: 1960/07/01 Referring Provider:  Eppie Gibson, MD  Encounter Date: 07/05/2017   ST - Arrived-Cancel  SLP met pt in lobby with Liliane Channel Diehl(H&N RN Navigator) due to pt being weak upon registration and transferred to wheelchair. Pt with appt on Thursday to speak with Dr. Lebron Conners re: pt prognosis/plans for further care.  Pt indicated he was too fatigued to participate in [place] today and politely declined ST. Liliane Channel suggested pt see if Dr. Lebron Conners was available to speak to pt today, and also for [facility] to check on status of pt's PEG tube. Pt agreed to this.  Rescheduling will be arranged as is feasible for the patient. If pt not seen in approx 30 days, pt will be d/c'd.    [facility] ,[place], [place]  07/05/2017, 1:59 PM  [facility] [address] [place], [place], [address] Phone: [phone]   Fax:  [phone]

## 2017-07-05 NOTE — Telephone Encounter (Signed)
Per Dr. Clydene Laming instructions, referral for Hospice care initiated. Call placed to Amber at [facility]. Amber given patient information and contact number for patient. Per patient, the number to use to contact him is [phone].

## 2017-07-06 ENCOUNTER — Other Ambulatory Visit: Payer: Self-pay

## 2017-07-06 ENCOUNTER — Encounter (HOSPITAL_COMMUNITY): Payer: Self-pay

## 2017-07-06 ENCOUNTER — Telehealth: Payer: Self-pay

## 2017-07-06 ENCOUNTER — Emergency Department (HOSPITAL_COMMUNITY): Payer: Medicaid Other

## 2017-07-06 ENCOUNTER — Inpatient Hospital Stay (HOSPITAL_COMMUNITY)
Admission: EM | Admit: 2017-07-06 | Discharge: 2017-07-17 | DRG: 177 | Disposition: E | Payer: Medicaid Other | Attending: Internal Medicine | Admitting: Internal Medicine

## 2017-07-06 DIAGNOSIS — F17210 Nicotine dependence, cigarettes, uncomplicated: Secondary | ICD-10-CM | POA: Diagnosis present

## 2017-07-06 DIAGNOSIS — Z79899 Other long term (current) drug therapy: Secondary | ICD-10-CM | POA: Diagnosis not present

## 2017-07-06 DIAGNOSIS — J910 Malignant pleural effusion: Secondary | ICD-10-CM | POA: Diagnosis present

## 2017-07-06 DIAGNOSIS — D539 Nutritional anemia, unspecified: Secondary | ICD-10-CM | POA: Diagnosis present

## 2017-07-06 DIAGNOSIS — C760 Malignant neoplasm of head, face and neck: Secondary | ICD-10-CM | POA: Diagnosis not present

## 2017-07-06 DIAGNOSIS — Z85818 Personal history of malignant neoplasm of other sites of lip, oral cavity, and pharynx: Secondary | ICD-10-CM | POA: Diagnosis not present

## 2017-07-06 DIAGNOSIS — L8990 Pressure ulcer of unspecified site, unspecified stage: Secondary | ICD-10-CM | POA: Diagnosis present

## 2017-07-06 DIAGNOSIS — Z923 Personal history of irradiation: Secondary | ICD-10-CM | POA: Diagnosis not present

## 2017-07-06 DIAGNOSIS — K219 Gastro-esophageal reflux disease without esophagitis: Secondary | ICD-10-CM | POA: Diagnosis present

## 2017-07-06 DIAGNOSIS — C062 Malignant neoplasm of retromolar area: Secondary | ICD-10-CM | POA: Diagnosis present

## 2017-07-06 DIAGNOSIS — E871 Hypo-osmolality and hyponatremia: Secondary | ICD-10-CM | POA: Diagnosis present

## 2017-07-06 DIAGNOSIS — Z9981 Dependence on supplemental oxygen: Secondary | ICD-10-CM | POA: Diagnosis not present

## 2017-07-06 DIAGNOSIS — Z681 Body mass index (BMI) 19 or less, adult: Secondary | ICD-10-CM | POA: Diagnosis not present

## 2017-07-06 DIAGNOSIS — D638 Anemia in other chronic diseases classified elsewhere: Secondary | ICD-10-CM | POA: Diagnosis present

## 2017-07-06 DIAGNOSIS — Z66 Do not resuscitate: Secondary | ICD-10-CM | POA: Diagnosis present

## 2017-07-06 DIAGNOSIS — Z931 Gastrostomy status: Secondary | ICD-10-CM | POA: Diagnosis not present

## 2017-07-06 DIAGNOSIS — J9621 Acute and chronic respiratory failure with hypoxia: Secondary | ICD-10-CM | POA: Diagnosis not present

## 2017-07-06 DIAGNOSIS — J189 Pneumonia, unspecified organism: Secondary | ICD-10-CM

## 2017-07-06 DIAGNOSIS — J690 Pneumonitis due to inhalation of food and vomit: Secondary | ICD-10-CM | POA: Diagnosis not present

## 2017-07-06 DIAGNOSIS — E43 Unspecified severe protein-calorie malnutrition: Secondary | ICD-10-CM

## 2017-07-06 DIAGNOSIS — Z7189 Other specified counseling: Secondary | ICD-10-CM | POA: Diagnosis not present

## 2017-07-06 DIAGNOSIS — R0602 Shortness of breath: Secondary | ICD-10-CM

## 2017-07-06 DIAGNOSIS — I10 Essential (primary) hypertension: Secondary | ICD-10-CM

## 2017-07-06 DIAGNOSIS — Z515 Encounter for palliative care: Secondary | ICD-10-CM | POA: Diagnosis not present

## 2017-07-06 DIAGNOSIS — Y95 Nosocomial condition: Secondary | ICD-10-CM | POA: Diagnosis present

## 2017-07-06 DIAGNOSIS — B9562 Methicillin resistant Staphylococcus aureus infection as the cause of diseases classified elsewhere: Secondary | ICD-10-CM | POA: Diagnosis present

## 2017-07-06 DIAGNOSIS — C799 Secondary malignant neoplasm of unspecified site: Secondary | ICD-10-CM | POA: Diagnosis not present

## 2017-07-06 DIAGNOSIS — Z9221 Personal history of antineoplastic chemotherapy: Secondary | ICD-10-CM | POA: Diagnosis not present

## 2017-07-06 LAB — BASIC METABOLIC PANEL
ANION GAP: 12 (ref 5–15)
BUN: 17 mg/dL (ref 6–20)
CHLORIDE: 91 mmol/L — AB (ref 101–111)
CO2: 28 mmol/L (ref 22–32)
Calcium: 12.2 mg/dL — ABNORMAL HIGH (ref 8.9–10.3)
Creatinine, Ser: 0.75 mg/dL (ref 0.61–1.24)
GFR calc Af Amer: >60 mL/min (ref >60)
GFR calc non Af Amer: >60 mL/min (ref >60)
GLUCOSE: 94 mg/dL (ref 65–99)
POTASSIUM: 3.9 mmol/L (ref 3.5–5.1)
Sodium: 131 mmol/L — ABNORMAL LOW (ref 135–145)

## 2017-07-06 LAB — CBC WITH DIFFERENTIAL/PLATELET
BASOS ABS: 0 10*3/uL (ref 0.0–0.1)
Basophils Relative: 0 %
EOS PCT: 1 %
Eosinophils Absolute: 0 10*3/uL (ref 0.0–0.7)
HEMATOCRIT: 32.1 % — AB (ref 39.0–52.0)
Hemoglobin: 11 g/dL — ABNORMAL LOW (ref 13.0–17.0)
LYMPHS PCT: 12 %
Lymphs Abs: 0.9 10*3/uL (ref 0.7–4.0)
MCH: 33.7 pg (ref 26.0–34.0)
MCHC: 34.3 g/dL (ref 30.0–36.0)
MCV: 98.5 fL (ref 78.0–100.0)
MONO ABS: 0.9 10*3/uL (ref 0.1–1.0)
MONOS PCT: 12 %
NEUTROS ABS: 5.5 10*3/uL (ref 1.7–7.7)
Neutrophils Relative %: 75 %
PLATELETS: 516 10*3/uL — AB (ref 150–400)
RBC: 3.26 MIL/uL — ABNORMAL LOW (ref 4.22–5.81)
RDW: 13.9 % (ref 11.5–15.5)
WBC: 7.3 10*3/uL (ref 4.0–10.5)

## 2017-07-06 LAB — C-REACTIVE PROTEIN: CRP: 10 mg/dL — ABNORMAL HIGH (ref <1.0)

## 2017-07-06 LAB — I-STAT CG4 LACTIC ACID, ED: Lactic Acid, Venous: 1.47 mmol/L (ref 0.5–1.9)

## 2017-07-06 MED ORDER — SODIUM CHLORIDE 0.9 % IV SOLN
INTRAVENOUS | Status: DC
  Administered 2017-07-06 – 2017-07-07 (×2): via INTRAVENOUS
  Administered 2017-07-07: 1000 mL via INTRAVENOUS
  Administered 2017-07-07: 20:00:00 via INTRAVENOUS

## 2017-07-06 MED ORDER — VANCOMYCIN HCL IN DEXTROSE 750-5 MG/150ML-% IV SOLN
750.0000 mg | INTRAVENOUS | Status: DC
  Administered 2017-07-07 – 2017-07-08 (×2): 750 mg via INTRAVENOUS
  Filled 2017-07-06 (×3): qty 150

## 2017-07-06 MED ORDER — ACETAMINOPHEN 650 MG RE SUPP: 650.0000 mg | Freq: Four times a day (QID) | RECTAL | Status: DC | PRN

## 2017-07-06 MED ORDER — ONDANSETRON HCL 4 MG PO TABS: 4.0000 mg | ORAL_TABLET | Freq: Four times a day (QID) | ORAL | Status: DC | PRN

## 2017-07-06 MED ORDER — SODIUM CHLORIDE 0.9% FLUSH
3.0000 mL | Freq: Two times a day (BID) | INTRAVENOUS | Status: DC
  Administered 2017-07-06 – 2017-07-10 (×7): 3 mL via INTRAVENOUS

## 2017-07-06 MED ORDER — VANCOMYCIN HCL IN DEXTROSE 1-5 GM/200ML-% IV SOLN
1000.0000 mg | INTRAVENOUS | Status: AC
  Administered 2017-07-06: 1000 mg via INTRAVENOUS
  Filled 2017-07-06: qty 200

## 2017-07-06 MED ORDER — IPRATROPIUM-ALBUTEROL 0.5-2.5 (3) MG/3ML IN SOLN
3.0000 mL | Freq: Four times a day (QID) | RESPIRATORY_TRACT | Status: DC
  Administered 2017-07-06 – 2017-07-10 (×14): 3 mL via RESPIRATORY_TRACT
  Filled 2017-07-06 (×14): qty 3

## 2017-07-06 MED ORDER — ONDANSETRON HCL 4 MG/2ML IJ SOLN: 4.0000 mg | Freq: Four times a day (QID) | INTRAMUSCULAR | Status: DC | PRN

## 2017-07-06 MED ORDER — SODIUM CHLORIDE 0.9 % IV BOLUS
500.0000 mL | Freq: Once | INTRAVENOUS | Status: AC
  Administered 2017-07-06: 500 mL via INTRAVENOUS

## 2017-07-06 MED ORDER — MORPHINE SULFATE (PF) 2 MG/ML IV SOLN: 2.0000 mg | INTRAVENOUS | Status: DC

## 2017-07-06 MED ORDER — IPRATROPIUM-ALBUTEROL 0.5-2.5 (3) MG/3ML IN SOLN
3.0000 mL | Freq: Once | RESPIRATORY_TRACT | Status: AC
  Administered 2017-07-06: 3 mL via RESPIRATORY_TRACT
  Filled 2017-07-06: qty 3

## 2017-07-06 MED ORDER — ENOXAPARIN SODIUM 40 MG/0.4ML ~~LOC~~ SOLN
40.0000 mg | SUBCUTANEOUS | Status: DC
  Administered 2017-07-06: 40 mg via SUBCUTANEOUS
  Filled 2017-07-06 (×2): qty 0.4

## 2017-07-06 MED ORDER — MORPHINE SULFATE (PF) 4 MG/ML IV SOLN
2.0000 mg | INTRAVENOUS | Status: AC
  Administered 2017-07-06: 2 mg via INTRAVENOUS
  Filled 2017-07-06: qty 1

## 2017-07-06 MED ORDER — GUAIFENESIN ER 600 MG PO TB12
600.0000 mg | ORAL_TABLET | Freq: Two times a day (BID) | ORAL | Status: DC
  Administered 2017-07-06: 600 mg via ORAL
  Filled 2017-07-06: qty 1

## 2017-07-06 MED ORDER — CEFEPIME HCL 1 G IJ SOLR
1.0000 g | Freq: Three times a day (TID) | INTRAMUSCULAR | Status: DC
  Administered 2017-07-07 – 2017-07-09 (×8): 1 g via INTRAVENOUS
  Filled 2017-07-06 (×9): qty 1

## 2017-07-06 MED ORDER — ACETAMINOPHEN 325 MG PO TABS: 650.0000 mg | ORAL_TABLET | Freq: Four times a day (QID) | ORAL | Status: DC | PRN

## 2017-07-06 MED ORDER — SODIUM CHLORIDE 0.9 % IV SOLN
2.0000 g | Freq: Once | INTRAVENOUS | Status: AC
  Administered 2017-07-06: 2 g via INTRAVENOUS
  Filled 2017-07-06: qty 2

## 2017-07-06 NOTE — Progress Notes (Signed)
A consult was received from an ED physician for Vancomycin per pharmacy dosing.  The patient's profile has been reviewed for ht/wt/allergies/indication/available labs.   A one time order has been placed for Vancomycin 1g IV.  Further antibiotics/pharmacy consults should be ordered by admitting physician if indicated.                       Thank you, Gretta Arab PharmD, BCPS Pager [phone] 07/16/2017 7:14 PM

## 2017-07-06 NOTE — Progress Notes (Signed)
ED TO INPATIENT HANDOFF REPORT  Name/Age/Gender Lawrence Arias 57 y.o. male  Code Status    Code Status Orders  (From admission, onward)        Start     Ordered   06/22/2017 1931  Full code  Continuous     07/13/2017 1934    Code Status History    Date Active Date Inactive Code Status Order ID Comments User Context   06/10/2017 1624 06/14/2017 1834 Full Code [id number]  Velvet Bathe, MD Inpatient      Home/SNF/Other Home  Chief Complaint [facility]   Level of Care/Admitting Diagnosis ED Disposition    ED Disposition Condition [place]: [facility] [[id number]]  Level of Care: Stepdown [14]  Admit to SDU based on following criteria: Respiratory Distress:  Frequent assessment and/or intervention to maintain adequate ventilation/respiration, pulmonary toilet, and respiratory treatment.  Diagnosis: Acute on chronic respiratory failure with hypoxia [facility]) [[id number]]  Admitting Physician: Norval Morton [[id number]]  Attending Physician: Norval Morton [[id number]]  Estimated length of stay: 3 - 4 days  Certification:: I certify this patient will need inpatient services for at least 2 midnights  PT Class (Do Not Modify): Inpatient [101]  PT Acc Code (Do Not Modify): Private [1]       Medical History Past Medical History:  Diagnosis Date  . Cancer ([place])   . GERD (gastroesophageal reflux disease)    uses OTC prn  . History of radiation therapy 12/31/16- 02/23/2017   Left oral cavity and regional nodes, 66 Gy in 33 fractions.   . Hypertension   . Tongue lesion     Allergies No Known Allergies  IV Location/Drains/Wounds Patient Lines/Drains/Airways Status   Active Line/Drains/Airways    Name:   Placement date:   Placement time:   Site:   Days:   Implanted Port 12/22/16 Right Chest   12/22/16    1534    Chest   196   Peripheral IV 07/08/2017 Right Antecubital   06/21/2017    1718    Antecubital   less than 1   Gastrostomy/Enterostomy  Gastrostomy 16 Fr. LUQ   06/18/17    1709    LUQ   18   Incision (Closed) 04/29/17 Neck Other (Comment)   04/29/17    0902     68   Tracheostomy [place] 4 mm Uncuffed   06/10/17    1449    4 mm   26   Wound / Incision (Open or Dehisced) 06/10/17 Other (Comment) Abdomen Medial status post peg tube   06/10/17    1825    Abdomen   26          Labs/Imaging Results for orders placed or performed during the hospital encounter of 06/19/2017 (from the past 48 hour(s))  Basic metabolic panel     Status: Abnormal   Collection Time: 06/16/2017  5:16 PM  Result Value Ref Range   Sodium 131 (L) 135 - 145 mmol/L   Potassium 3.9 3.5 - 5.1 mmol/L   Chloride 91 (L) 101 - 111 mmol/L   CO2 28 22 - 32 mmol/L   Glucose, Bld 94 65 - 99 mg/dL   BUN 17 6 - 20 mg/dL   Creatinine, Ser 0.75 0.61 - 1.24 mg/dL   Calcium 12.2 (H) 8.9 - 10.3 mg/dL   GFR calc non Af Amer >60 >60 mL/min   GFR calc Af Amer >60 >60 mL/min    Comment: (NOTE)  The eGFR has been calculated using the CKD EPI equation. This calculation has not been validated in all clinical situations. eGFR's persistently <60 mL/min signify possible Chronic Kidney Disease.    Anion gap 12 5 - 15    Comment: Performed at [facility], [place] [address] [place], [place] [address]  CBC with Differential     Status: Abnormal   Collection Time: 07/12/2017  5:16 PM  Result Value Ref Range   WBC 7.3 4.0 - 10.5 K/uL   RBC 3.26 (L) 4.22 - 5.81 MIL/uL   Hemoglobin 11.0 (L) 13.0 - 17.0 g/dL   HCT 32.1 (L) 39.0 - 52.0 %   MCV 98.5 78.0 - 100.0 fL   MCH 33.7 26.0 - 34.0 pg   MCHC 34.3 30.0 - 36.0 g/dL   RDW 13.9 11.5 - 15.5 %   Platelets 516 (H) 150 - 400 K/uL   Neutrophils Relative % 75 %   Neutro Abs 5.5 1.7 - 7.7 K/uL   Lymphocytes Relative 12 %   Lymphs Abs 0.9 0.7 - 4.0 K/uL   Monocytes Relative 12 %   Monocytes Absolute 0.9 0.1 - 1.0 K/uL   Eosinophils Relative 1 %   Eosinophils Absolute 0.0 0.0 - 0.7 K/uL   Basophils Relative 0 %    Basophils Absolute 0.0 0.0 - 0.1 K/uL    Comment: Performed at [facility], [place] [address] [place], [place] [address]  I-Stat CG4 Lactic Acid, ED     Status: None   Collection Time: 06/26/2017  5:25 PM  Result Value Ref Range   Lactic Acid, Venous 1.47 0.5 - 1.9 mmol/L   Dg Chest 2 View  Result Date: 07/02/2017 CLINICAL DATA:  Shortness of breath. EXAM: CHEST - 2 VIEW COMPARISON:  Chest radiograph Jul 01, 2017 and chest CT May 162019 FINDINGS: Port-A-Cath tip is at the superior vena cava. Tracheostomy tube tip is 6.3 cm above the carina. No pneumothorax. There is a persistent right pleural effusion with patchy airspace consolidation in both base regions, more on the left than on the right. Heart size and pulmonary vascular normal. No appreciable adenopathy. There are surgical clips in left axillary region. There are also surgical clips in the lower right neck region. No blastic or lytic bone lesions are identified. There old healed rib fractures on the right. IMPRESSION: Tube and catheter positions as described without pneumothorax. Right pleural effusion with bilateral lower lobe consolidation. Heart size normal. There is increase infiltrate in the left lower lobe compared to recent studies. Pleural effusion on the right essentially stable. Electronically Signed   By: Lowella Grip III M.D.   On: 07/11/2017 18:25    Pending Labs Unresulted Labs (From admission, onward)   Start     Ordered   07/07/17 0500  CBC  Tomorrow morning,   R     07/14/2017 1934   07/07/17 0500  Comprehensive metabolic panel  Tomorrow morning,   R     07/11/2017 1934   06/27/2017 1934  Legionella Pneumophila Serogp 1 Ur Ag  Once,   R     06/26/2017 1934   07/09/2017 1934  C-reactive protein  Add-on,   R     07/05/2017 1934   06/28/2017 1930  Culture, blood (routine x 2) Call MD if unable to obtain prior to antibiotics being given  BLOOD CULTURE X 2,   R    Comments:  If blood cultures drawn in Emergency  Department - Do not draw  and cancel order    06/23/2017 1934   07/08/2017 1930  Culture, sputum-assessment  Once,   R     07/15/2017 1934   06/25/2017 1930  Gram stain  Once,   R     07/07/2017 1934   07/05/2017 1930  Strep pneumoniae urinary antigen  Once,   R     07/07/2017 1934      Vitals/Pain Today's Vitals   07/05/2017 2008 07/01/2017 2024 06/27/2017 2025 07/03/2017 2055  BP:  (!) 127/105 (!) 127/105   Pulse:  (!) 102 (!) 102   Resp:  (!) 25 (!) 35   Temp:      TempSrc:      SpO2: 94% 97% 98% 94%  Weight:      Height:      PainSc:        Isolation Precautions No active isolations  Medications Medications  vancomycin (VANCOCIN) IVPB 1000 mg/200 mL premix (1,000 mg Intravenous New Bag/Given 07/15/2017 2017)  enoxaparin (LOVENOX) injection 40 mg (has no administration in time range)  sodium chloride flush (NS) 0.9 % injection 3 mL (has no administration in time range)  ondansetron (ZOFRAN) tablet 4 mg (has no administration in time range)    Or  ondansetron (ZOFRAN) injection 4 mg (has no administration in time range)  acetaminophen (TYLENOL) tablet 650 mg (has no administration in time range)    Or  acetaminophen (TYLENOL) suppository 650 mg (has no administration in time range)  ipratropium-albuterol (DUONEB) 0.5-2.5 (3) MG/3ML nebulizer solution 3 mL (3 mLs Nebulization Given 07/16/2017 2054)  ceFEPIme (MAXIPIME) 1 g in sodium chloride 0.9 % 100 mL IVPB (has no administration in time range)  vancomycin (VANCOCIN) IVPB 750 mg/150 ml premix (has no administration in time range)  0.9 %  sodium chloride infusion (has no administration in time range)  sodium chloride 0.9 % bolus 500 mL (has no administration in time range)  guaiFENesin (MUCINEX) 12 hr tablet 600 mg (has no administration in time range)  morphine 2 MG/ML injection 2 mg (has no administration in time range)  ipratropium-albuterol (DUONEB) 0.5-2.5 (3) MG/3ML nebulizer solution 3 mL (3 mLs Nebulization Given 07/14/2017 1731)  ceFEPIme  (MAXIPIME) 2 g in sodium chloride 0.9 % 100 mL IVPB (0 g Intravenous Stopped 06/29/2017 2007)    Mobility non-ambulatory

## 2017-07-06 NOTE — Telephone Encounter (Signed)
Called patient with change of appointment time. Per 5/21 los

## 2017-07-06 NOTE — ED Notes (Signed)
Bed: WA21 Expected date:  Expected time:  Means of arrival:  Comments: EMS- SOB 

## 2017-07-06 NOTE — ED Provider Notes (Signed)
[facility] DEPT Provider Note   CSN: [id number] Arrival date & time: 07/05/2017  1622     History   Chief Complaint Chief Complaint  Patient presents with  . Shortness of Breath    HPI Lawrence Arias is a 57 y.o. male.  Lawrence Arias is a 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  Per EMS hospice was on scene during their initial consult when they arrived, patient will be initiating care with [facility].  Patient arrives on 6 L/min O2, per EMS he typically requires 3 L at home.  Patient reports shortness of breath worsening over the past few days, with cough, mucus drainage noted from his trach.  History is limited from the patient, but he is able to answer yes or no questions, does report he is felt feverish at home.  He denies chest pain.  Patient reports productive cough that is been worsening.  He denies any abdominal pain, nausea or vomiting.  Patient has PEG tube in place for feeding.  Tried to contact family members at home without success.  Also contacted [facility] hospitalists, but they were not able to provide much information regarding patient's symptoms today.  Level 5 caveat: Nonverbal s/p tracheostomy     Past Medical History:  Diagnosis Date  . Cancer ([place])   . GERD (gastroesophageal reflux disease)    uses OTC prn  . History of radiation therapy 12/31/16- 02/23/2017   Left oral cavity and regional nodes, 66 Gy in 33 fractions.   . Hypertension   . Tongue lesion     Patient Active Problem List   Diagnosis Date Noted  . 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 Hyponatremia 06/10/2017  . Hypomagnesemia 06/10/2017  . Hypokalemia 05/07/2017  . 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 DIRECT LARYNGOSCOPY N/A 07/24/2016   Procedure: DIRECT LARYNGOSCOPY;  Surgeon: Melissa Montane, MD;  Location: [facility];  Service: ENT;  Laterality: N/A;  Direct laryngoscopy with biopsy tongue lesion  . FRACTURE SURGERY     left wrist, remote  . FREE FLAP RADIAL FOREARM Left 10/16/2016   [facility]  . free flap scapular Left 10/16/2016   Dr. Hendricks Limes - [facility]  . GASTROSTOMY  10/27/2016   [facility]. Daphene [facility].   . IR FLUORO GUIDE PORT INSERTION RIGHT  12/22/2016  . IR [place] TUBE PERCUT W/FLUORO  06/18/2017  . IR US GUIDE VASC ACCESS RIGHT  12/22/2016  . NECK DISSECTION Bilateral 10/16/2016   Selective neck dissection, Dr. Hendricks Limes [facility]  . 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 Hendricks Limes [facility]  . SKIN GRAFT Left 10/16/2016   Split thickness skin graft, leg Dr. Hendricks Limes. [facility]  . TONGUE BIOPSY  07/24/2016   Procedure: TONGUE BIOPSY;  Surgeon: Melissa Montane, MD;  Location: [facility];  Service: ENT;;  . TRACHEOSTOMY  10/16/2016   Dr. Hendricks Limes [facility]  . TRACHEOSTOMY TUBE PLACEMENT N/A 04/29/2017   Procedure: TRACHEOSTOMY REVISION;  Surgeon: Jodi Marble, MD;  Location: [facility];  Service: ENT;  Laterality: N/A;        Home Medications    Prior to Admission medications   Medication Sig Start Date End Date Taking? Authorizing Provider  acetaminophen (TYLENOL) 500 MG tablet Take 1,000 mg by mouth every 6 (six) hours as needed for moderate pain.    [provider]  fluconazole (DIFLUCAN) 100 MG tablet Take 2 tablets today, then 1 tablet daily x 6 more days. 06/18/17   Eppie Gibson, MD  lisinopril (PRINIVIL,ZESTRIL) 20 MG tablet Take 20 mg by mouth daily. 05/23/17   [provider]  magnesium oxide (MAG-OX) 400 (241.3 Mg) MG tablet  Take 1 tablet (400 mg total) by mouth 2 (two) times daily. 05/21/17   Heath Lark, MD  ondansetron (ZOFRAN) 8 MG tablet Take 1 tablet (8 mg total) by mouth every 8 (eight) hours as needed for nausea. 05/21/17   Heath Lark, MD  oxyCODONE (OXYCONTIN) 10 mg 12 hr tablet Take 1 tablet (10 mg total) by mouth every 12 (twelve) hours for 14 days. 07/01/17 07/15/17  Ardath Sax, MD  Oxycodone HCl 10 MG TABS Take 1 tablet (10 mg total) by mouth every 4 (four) hours as needed for up to 14 days. 07/01/17 07/15/17  Ardath Sax, MD  polyethylene glycol powder [facility]) powder Use as directed for constipation. Follow directions on container. 01/11/17   Eppie Gibson, MD    Family History Family History  Problem Relation Age of Onset  . Hypertension Mother   . Hypertension Father     Social History Social History   Tobacco Use  . Smoking status: Current Some Day Smoker    Packs/day: 0.50    Types: Cigarettes  . Smokeless tobacco: Never Used  . Tobacco comment: he is smoking 0ne every other day (11/20/16)  Substance Use Topics  . Alcohol use: Not on file  . Drug use: No     Allergies   Patient has no known allergies.   Review of Systems Review of Systems  Unable to perform ROS: Patient nonverbal     Physical Exam Updated Vital Signs BP (!) 129/109   Pulse (!) 109   Temp 98.1 F (36.7 C) (Oral)   Resp 20   Ht 5\' 7"  (1.702 m)   Wt 44 kg (97 lb)   SpO2 (!) 88%   BMI 15.19 kg/m   Physical Exam  Constitutional: He appears well-developed and well-nourished. No distress.  HENT:  Head: Normocephalic and atraumatic.  Mouth/Throat: Oropharynx is clear and moist.  Eyes: Right eye exhibits no discharge. Left eye exhibits no discharge.  Neck: Neck supple.  Tracheostomy in place  Cardiovascular: Normal rate, regular rhythm, normal heart sounds and intact distal pulses.  Pulmonary/Chest: No respiratory distress.  Tracheostomy in place.  Respirations appear equal and unlabored,  patient is mildly tachypneic on 6 L/min, lungs with mild rhonchi bilaterally, no wheezes or crackles noted, white mucus discharge noted from tracheostomy  Abdominal: Soft. Bowel sounds are normal. He exhibits no distension and no mass. There is no tenderness. There is no guarding.  Abdomen soft, nondistended, nontender to palpation in all quadrants without guarding or peritoneal signs  Musculoskeletal: He exhibits no edema or deformity.  Bilateral lower extremities warm and well perfused without signs of edema  Neurological: He is alert. Coordination normal.  Skin: Skin is warm and dry. Capillary refill takes less than 2 seconds. He is not diaphoretic.  Psychiatric: He has a normal mood and affect. His behavior is normal.  Nursing note and  vitals reviewed.    ED Treatments / Results  Labs (all labs ordered are listed, but only abnormal results are displayed) Labs Reviewed  BASIC METABOLIC PANEL - Abnormal; Notable for the following components:      Result Value   Sodium 131 (*)    Chloride 91 (*)    Calcium 12.2 (*)    All other components within normal limits  CBC WITH DIFFERENTIAL/PLATELET - Abnormal; Notable for the following components:   RBC 3.26 (*)    Hemoglobin 11.0 (*)    HCT 32.1 (*)    Platelets 516 (*)    All other components within normal limits  I-STAT CG4 LACTIC ACID, ED    EKG EKG Interpretation  Date/Time:  Tuesday Jul 06 2017 17:34:57 EDT Ventricular Rate:  115 PR Interval:    QRS Duration: 89 QT Interval:  333 QTC Calculation: 461 R Axis:   50 Text Interpretation:  Sinus tachycardia Left atrial enlargement Borderline low voltage, extremity leads Baseline wander in lead(s) II III aVF V4 no significant change since Jul 01 2017 Confirmed by Sherwood Gambler [phone]) on 07/12/2017 5:48:25 PM   Radiology Dg Chest 2 View  Result Date: 07/09/2017 CLINICAL DATA:  Shortness of breath. EXAM: CHEST - 2 VIEW COMPARISON:  Chest radiograph Jul 01, 2017 and chest CT  May 162019 FINDINGS: Port-A-Cath tip is at the superior vena cava. Tracheostomy tube tip is 6.3 cm above the carina. No pneumothorax. There is a persistent right pleural effusion with patchy airspace consolidation in both base regions, more on the left than on the right. Heart size and pulmonary vascular normal. No appreciable adenopathy. There are surgical clips in left axillary region. There are also surgical clips in the lower right neck region. No blastic or lytic bone lesions are identified. There old healed rib fractures on the right. IMPRESSION: Tube and catheter positions as described without pneumothorax. Right pleural effusion with bilateral lower lobe consolidation. Heart size normal. There is increase infiltrate in the left lower lobe compared to recent studies. Pleural effusion on the right essentially stable. Electronically Signed   By: Lowella Grip III M.D.   On: 07/08/2017 18:25    Procedures Procedures (including critical care time)  Medications Ordered in ED Medications  ceFEPIme (MAXIPIME) 2 g in sodium chloride 0.9 % 100 mL IVPB (has no administration in time range)  vancomycin (VANCOCIN) IVPB 1000 mg/200 mL premix (has no administration in time range)  ipratropium-albuterol (DUONEB) 0.5-2.5 (3) MG/3ML nebulizer solution 3 mL (3 mLs Nebulization Given 07/05/2017 1731)     Initial Impression / Assessment and Plan / ED Course  I have reviewed the triage vital signs and the nursing notes.  Pertinent labs & imaging results that were available during my care of the patient were reviewed by me and considered in my medical decision making (see chart for details).  Patient with tracheostomy in the setting of jawbone cancer, for which he recently discontinued treatment, presents the emergency department for evaluation of shortness of breath, he has increasing oxygen requirement.  Afebrile, tachycardic but vitals otherwise stable.  Lungs with diffuse rhonchi bilaterally.  Patient  denies any associated chest pain.  History is limited, attempted to contact family but unable to reach anyone.  We will get basic labs, lactic acid and chest x-ray.  Lactic acid within normal limits, no leukocytosis, but patient is likely still somewhat immunosuppressed from chemotherapy, hemoglobin is stable, no acute electrolyte derangements requiring intervention, normal renal function.  Chest x-ray shows worsening of  left lower lobe infiltrate compared to previous studies, there is a right-sided pleural effusion which appears stable.   Had discussion with patient regarding his wishes as he was recently enrolled in hospice.  Patient was still like to be treated for this infection.  Will start patient on Vanco and cefepime.  Hospitalist consulted for admission.  Spoke with Dr. Keene Breath with [facility] hospitalist who will see and admit the patient.  Patient discussed with Dr. Regenia Skeeter, who saw patient as well and agrees with plan.   Final Clinical Impressions(s) / ED Diagnoses   Final diagnoses:  HCAP (healthcare-associated pneumonia)  Shortness of breath  Acute on chronic respiratory failure with hypoxia [facility])    ED Discharge Orders    None       Janet Berlin 07/15/2017 Patrecia Pour    Sherwood Gambler, MD 07/02/2017 [phone]

## 2017-07-06 NOTE — Progress Notes (Signed)
Pharmacy Antibiotic Note  Lawrence Arias is a 57 y.o. male admitted on 07/04/2017 with pneumonia.  Pharmacy has been consulted for Vancomycin dosing.  Plan: Vancomycin 1g IV x1 then 750mg  IV q24h. Check vancomycin levels if remains on vancomycin > 3-4 days.  Goal AUC 400-500. Follow up renal fxn, culture results, and clinical course. F/u ability to de-escalate antibiotics.   Height: 5\' 7"  (170.2 cm) Weight: 97 lb (44 kg) IBW/kg (Calculated) : 66.1  Temp (24hrs), Avg:98.1 F (36.7 C), Min:98.1 F (36.7 C), Max:98.1 F (36.7 C)  Recent Labs  Lab 07/01/17 0847 06/30/2017 1716 06/27/2017 1725  WBC 10.7* 7.3  --   CREATININE 0.73 0.75  --   LATICACIDVEN  --   --  1.47    Estimated Creatinine Clearance: 62.6 mL/min (by C-G formula based on SCr of 0.75 mg/dL).    No Known Allergies  Antimicrobials this admission: 5/21 Vancomycin >>  5/21 Cefepime >>   Dose adjustments this admission:   Microbiology results: 5/21 BCx:   Sputum:    MRSA PCR:   Thank you for allowing pharmacy to be a part of this patient's care.  Gretta Arab PharmD, BCPS Pager [phone] 06/30/2017 7:46 PM

## 2017-07-06 NOTE — Progress Notes (Addendum)
This RT placed PT on 40 % ATC- PT states he utilizes supplemental 02 at home. Also, suctioned PT times 2- resulted in moderate, thick, white, yellow mucus (PT has good cough), cleaned trach flange, replaced trach collar, PT states he is breathing fine at this time.  Skin around, under flange is pink and slightly warm. PT states he has received radiation treatment to neck area. Extra #4 CFLS trach, Ambu bag, and suction caths are at bedside- PT is on monitor. Current Sp02 96%, HR 114, BP 127/104, coarse-diminished BBS.

## 2017-07-06 NOTE — ED Triage Notes (Signed)
EMS reports from home family called out for SOB. Trach pt. 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 Mucus discharge from trach.  BP 120/90 HR 116 Resp 28 Sp02 94 on 6lts Normally on 32ltrs

## 2017-07-06 NOTE — H&P (Addendum)
History and Physical    UNNAMED ZEIEN FIE:[id number] DOB: 05-30-1960 DOA: 07/03/2017  Referring MD/NP/PA: Carole Civil PCP: System, Provider Not In  Patient coming from: Via EMS  Chief Complaint: Shortness of breath  I have personally briefly reviewed patient's old medical records in [place]   HPI: Lawrence Arias is a 57 y.o. male with medical history significant of HTN, chronic respiratory failure, metastatic SCC of the retromolar trigone s/p surgery, adjunctive radiotherapy, palliative chemotherapy, and s/p Trach/PEG; who presents with complaints of shortness of breath and productive cough progressively worsening over the last 2 weeks.  History is difficult to obtain as patient is nonverbal due to tracheostomy.  Normally patient is oxygen dependent at 3 L cannula oxygen by trach collar at home.  He reports having a productive cough with thick sputum production. Associated symptoms of wheezing, malaise, poor p.o. intake, and continued left-sided neck pain that radiates to the shoulder.  Pain medication helps with symptoms.  He reports still taking in some liquids by mouth, but mostly utilizes his PEG tube.  He admits to coughing when trying to take things by mouth.  Denies any significant fever, chills appears from review of office records with Dr. Lebron Conners and Dr. Alvy Bimler chemotherapy was recently discontinued after repeat CT scan on 5/16 showing residual left neck mass with interval ulceration consistent with progressive disease.  Hospice had been consulted, and the patient reports that they were actually seeing him today prior to coming into the hospital.  At this time he still would like to remain a full code.  ED Course: Upon admission into the emergency department patient was seen to be afebrile, pulse 109 -113, respirations 20, blood pressure 127/104-129/109, and O2 saturations 88 improved to 98% on 40% FiO2.  Labs revealed WBC 7.3, hemoglobin 11, platelets 516, sodium 131,  and calcium 12.2.  Chest x-ray showing correct tube placement with right pleural effusion and bilateral lower lobe infiltrates.  Patient was started on empiric antibiotics of cefepime and vancomycin.  TRH called to admit.  Review of Systems  Unable to perform ROS: Patient nonverbal  Constitutional: Positive for malaise/fatigue and weight loss. Negative for chills and fever.  HENT: Negative for ear discharge and nosebleeds.   Eyes: Negative for double vision and photophobia.  Respiratory: Positive for cough, sputum production, shortness of breath and wheezing.   Cardiovascular: Negative for chest pain and leg swelling.  Gastrointestinal: Negative for nausea and vomiting.  Genitourinary: Negative for dysuria and frequency.  Musculoskeletal: Positive for myalgias and neck pain.  Neurological: Positive for weakness. Negative for focal weakness and loss of consciousness.  Psychiatric/Behavioral: Negative for memory loss and substance abuse.    Past Medical History:  Diagnosis Date  . Cancer ([place])   . GERD (gastroesophageal reflux disease)    uses OTC prn  . History of radiation therapy 12/31/16- 02/23/2017   Left oral cavity and regional nodes, 66 Gy in 33 fractions.   . Hypertension   . Tongue lesion     Past Surgical History:  Procedure Laterality Date  . DIRECT LARYNGOSCOPY N/A 07/24/2016   Procedure: DIRECT LARYNGOSCOPY;  Surgeon: Melissa Montane, MD;  Location: [place];  Service: ENT;  Laterality: N/A;  Direct laryngoscopy with biopsy tongue lesion  . FRACTURE SURGERY     left wrist, remote  . FREE FLAP RADIAL FOREARM Left 10/16/2016   Dr. Hendricks Limes -[facility]  . free flap scapular Left 10/16/2016   Dr. Hendricks Limes - [facility]  [facility] hospital  . GASTROSTOMY  10/27/2016   Dr. Daphene Calamity- [facility] hospital.   . IR FLUORO GUIDE PORT INSERTION RIGHT  12/22/2016  . IR [place] TUBE PERCUT W/FLUORO  06/18/2017  . IR US GUIDE VASC ACCESS RIGHT   12/22/2016  . NECK DISSECTION Bilateral 10/16/2016   Selective neck dissection, Dr. Hendricks Limes [facility]  . 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 Hendricks Limes [facility]  . SKIN GRAFT Left 10/16/2016   Split thickness skin graft, leg Dr. Hendricks Limes. [facility]  . TONGUE BIOPSY  07/24/2016   Procedure: TONGUE BIOPSY;  Surgeon: Melissa Montane, MD;  Location: [place];  Service: ENT;;  . TRACHEOSTOMY  10/16/2016   Dr. Hendricks Limes [facility]  . TRACHEOSTOMY TUBE PLACEMENT N/A 04/29/2017   Procedure: TRACHEOSTOMY REVISION;  Surgeon: Jodi Marble, MD;  Location: [place];  Service: ENT;  Laterality: N/A;     reports that he has been smoking cigarettes.  He has been smoking about 0.50 packs per day. He has never used smokeless tobacco. He reports that he does not use drugs. His alcohol history is not on file.  No Known Allergies  Family History  Problem Relation Age of Onset  . Hypertension Mother   . Hypertension Father     Prior to Admission medications   Medication Sig Start Date End Date Taking? Authorizing Provider  acetaminophen (TYLENOL) 500 MG tablet Take 1,000 mg by mouth every 6 (six) hours as needed for moderate pain.    [provider]  fluconazole (DIFLUCAN) 100 MG tablet Take 2 tablets today, then 1 tablet daily x 6 more days. 06/18/17   Eppie Gibson, MD  lisinopril (PRINIVIL,ZESTRIL) 20 MG tablet Take 20 mg by mouth daily. 05/23/17   [provider]  magnesium oxide (MAG-OX) 400 (241.3 Mg) MG tablet Take 1 tablet (400 mg total) by mouth 2 (two) times daily. 05/21/17   Heath Lark, MD  ondansetron (ZOFRAN) 8 MG tablet Take 1 tablet (8 mg total) by mouth every 8 (eight) hours as needed for nausea. 05/21/17   Heath Lark, MD  oxyCODONE (OXYCONTIN) 10 mg 12 hr tablet Take 1 tablet (10 mg total) by mouth every 12 (twelve) hours for 14 days. 07/01/17 07/15/17  Ardath Sax, MD  Oxycodone HCl 10 MG TABS Take 1 tablet (10 mg total) by mouth every 4 (four) hours as needed for  up to 14 days. 07/01/17 07/15/17  Ardath Sax, MD  polyethylene glycol powder [facility]) powder Use as directed for constipation. Follow directions on container. 01/11/17   Eppie Gibson, MD    Physical Exam:  Constitutional: Chronically ill and cachectic appearing male who appears to be in Vitals:   06/29/2017 1718 06/24/2017 1732 06/18/2017 1841 06/29/2017 1910  BP: (!) 129/109  (!) 129/109   Pulse: (!) 113  (!) 113 (!) 109  Resp: 20  20   Temp:      TempSrc:      SpO2: 94% 95% 95% (!) 88%  Weight:      Height:       Eyes: PERRL, lids and conjunctivae normal ENMT: Mucous membranes are dry. Posterior pharynx clear of any exudate or lesions.Normal dentition.  Neck: Nodules present on the lateral aspect of the left side.  Tracheostomy present. Respiratory: Patient appears to be in moderate respiratory distress.  Tachypneic with decreased overall aeration and rhonchi noted of the most notably on the right mid to lower lung. Cardiovascular: Tachypneic, no murmurs / rubs /  gallops. No extremity edema. 2+ pedal pulses. No carotid bruits.  Abdomen: no tenderness, no masses palpated. No hepatosplenomegaly. Bowel sounds positive.  PEG tube in place. Musculoskeletal: no clubbing / cyanosis. No joint deformity upper and lower extremities. Good ROM, no contractures.  Muscle wasting noted Skin: Postradiation changes noted of the skin of the neck.   Neurologic: CN 2-12 grossly intact. Sensation intact, DTR normal. Strength 4+/5 in all 4.  Psychiatric: Normal judgment and insight. Alert and oriented x 3. Normal mood.     Labs on Admission: I have personally reviewed following labs and imaging studies  CBC: Recent Labs  Lab 07/01/17 0847 06/28/2017 1716  WBC 10.7* 7.3  NEUTROABS 9.2* 5.5  HGB 11.2* 11.0*  HCT 32.1* 32.1*  MCV 96.7 98.5  PLT 725* 119*   Basic Metabolic Panel: Recent Labs  Lab 07/01/17 0847 07/09/2017 1716  NA 117* 131*  K 4.3 3.9  CL 80* 91*  CO2 25 28  GLUCOSE 100 94    BUN 7 17  CREATININE 0.73 0.75  CALCIUM 12.1* 12.2*  MG 1.0*  --   PHOS 3.9  --    GFR: Estimated Creatinine Clearance: 62.6 mL/min (by C-G formula based on SCr of 0.75 mg/dL). Liver Function Tests: Recent Labs  Lab 07/01/17 0847  AST 15  ALT 11  ALKPHOS 91  BILITOT 0.5  PROT 8.1  ALBUMIN 3.0*   No results for input(s): LIPASE, AMYLASE in the last 168 hours. No results for input(s): AMMONIA in the last 168 hours. Coagulation Profile: No results for input(s): INR, PROTIME in the last 168 hours. Cardiac Enzymes: No results for input(s): CKTOTAL, CKMB, CKMBINDEX, TROPONINI in the last 168 hours. BNP (last 3 results) No results for input(s): PROBNP in the last 8760 hours. HbA1C: No results for input(s): HGBA1C in the last 72 hours. CBG: No results for input(s): GLUCAP in the last 168 hours. Lipid Profile: No results for input(s): CHOL, HDL, LDLCALC, TRIG, CHOLHDL, LDLDIRECT in the last 72 hours. Thyroid Function Tests: No results for input(s): TSH, T4TOTAL, FREET4, T3FREE, THYROIDAB in the last 72 hours. Anemia Panel: No results for input(s): VITAMINB12, FOLATE, FERRITIN, TIBC, IRON, RETICCTPCT in the last 72 hours. Urine analysis:    Component Value Date/Time   COLORURINE STRAW (A) 06/10/2017 1209   APPEARANCEUR CLEAR 06/10/2017 1209   LABSPEC 1.003 (L) 06/10/2017 1209   PHURINE 5.0 06/10/2017 1209   GLUCOSEU NEGATIVE 06/10/2017 1209   HGBUR NEGATIVE 06/10/2017 1209   BILIRUBINUR NEGATIVE 06/10/2017 1209   KETONESUR 20 (A) 06/10/2017 1209   PROTEINUR NEGATIVE 06/10/2017 1209   UROBILINOGEN 1.0 07/01/2012 2214   NITRITE NEGATIVE 06/10/2017 1209   LEUKOCYTESUR NEGATIVE 06/10/2017 1209   Sepsis Labs: No results found for this or any previous visit (from the past 240 hour(s)).   Radiological Exams on Admission: Dg Chest 2 View  Result Date: 07/02/2017 CLINICAL DATA:  Shortness of breath. EXAM: CHEST - 2 VIEW COMPARISON:  Chest radiograph Jul 01, 2017 and chest CT  May 162019 FINDINGS: Port-A-Cath tip is at the superior vena cava. Tracheostomy tube tip is 6.3 cm above the carina. No pneumothorax. There is a persistent right pleural effusion with patchy airspace consolidation in both base regions, more on the left than on the right. Heart size and pulmonary vascular normal. No appreciable adenopathy. There are surgical clips in left axillary region. There are also surgical clips in the lower right neck region. No blastic or lytic bone lesions are identified. There old healed rib fractures  on the right. IMPRESSION: Tube and catheter positions as described without pneumothorax. Right pleural effusion with bilateral lower lobe consolidation. Heart size normal. There is increase infiltrate in the left lower lobe compared to recent studies. Pleural effusion on the right essentially stable. Electronically Signed   By: Lowella Grip III M.D.   On: 07/05/2017 18:25    EKG: Independently reviewed. Sinus tachycardia at 115  Assessment/Plan Respiratory failure with hypoxia 2/2 pneumonia: Patient presents with progressively worsening shortness of breath and cough.  O2 saturations noted to be as low as 88% on home oxygen 3 L and placed.  X-ray reveals worsening bilateral lower lobe infiltrates.   Patient currently on 40% FiO2 with improvement of O2 saturations.  No significant white blood cell count or lactic acid noted on lab work.  Given history suspect likely aspiration vs. healthcare associated pneumonia.  Patient was empirically treated with vancomycin and cefepime. - Admit to a stepdown bed  - Check blood and sputum cultures - Continue empiric antibiotics of cefepime and vancomycin for now - Elevate head of bed - Respiratory therapy consult - Chest physiotherapy  Hyponatremia: Acute.  Initial sodium noted to be 131 on admission suspect likely related to poor p.o. intake as seen during previous admission. - Bolus 500 mL of normal saline IV fluid, then placed on a  rate of 156ml/hr - Recheck BMP in a.m.   Hypercalcemia: Acute.  Initial calcium level noted to be 12.2 on admission.  Symptoms likely secondary to cancer. - IV fluids overnight - Recheck BMP in a.m.  Anemia of chronic disease: Hemoglobin 11 on admission which appears similar to previous.   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 Lebron Conners and Dr. Alvy Bimler.  Patient status post surgery, radiation, and palliative chemotherapy now discontinued due to recent CT imaging of chest on 5/16 showing disease progression.  Hospice consult had been started. - Continue home pain regimen - Social work consult - May want to notify hospice in a.m.  S/p PEG:  - Nutrition consult in a.m. for tube feeds  Essential hypertension - Continue lisinopril  Protein calorie malnutrition - Check prealbumin in a.m   DVT prophylaxis: lovenox   Code Status: Full  Family Communication: No family present at bedside Disposition Plan: To be determined Consults called: None Admission status: Inpatient  Norval Morton MD [facility] Pager [phone]   If 7PM-7AM, please contact night-coverage [email or web address] Password [facility]  06/16/2017, 7:27 PM

## 2017-07-07 DIAGNOSIS — I10 Essential (primary) hypertension: Secondary | ICD-10-CM | POA: Diagnosis present

## 2017-07-07 DIAGNOSIS — J9621 Acute and chronic respiratory failure with hypoxia: Secondary | ICD-10-CM

## 2017-07-07 DIAGNOSIS — E43 Unspecified severe protein-calorie malnutrition: Secondary | ICD-10-CM

## 2017-07-07 DIAGNOSIS — Z931 Gastrostomy status: Secondary | ICD-10-CM

## 2017-07-07 DIAGNOSIS — L8990 Pressure ulcer of unspecified site, unspecified stage: Secondary | ICD-10-CM | POA: Diagnosis present

## 2017-07-07 LAB — GLUCOSE, CAPILLARY
GLUCOSE-CAPILLARY: 98 mg/dL (ref 65–99)
Glucose-Capillary: 65 mg/dL (ref 65–99)
Glucose-Capillary: 92 mg/dL (ref 65–99)
Glucose-Capillary: 93 mg/dL (ref 65–99)
Glucose-Capillary: 96 mg/dL (ref 65–99)

## 2017-07-07 LAB — CBC
HCT: 23.1 % — ABNORMAL LOW (ref 39.0–52.0)
HEMOGLOBIN: 7.4 g/dL — AB (ref 13.0–17.0)
MCH: 31.2 pg (ref 26.0–34.0)
MCHC: 32 g/dL (ref 30.0–36.0)
MCV: 97.5 fL (ref 78.0–100.0)
Platelets: 507 10*3/uL — ABNORMAL HIGH (ref 150–400)
RBC: 2.37 MIL/uL — AB (ref 4.22–5.81)
RDW: 14.4 % (ref 11.5–15.5)
WBC: 4.9 10*3/uL (ref 4.0–10.5)

## 2017-07-07 LAB — COMPREHENSIVE METABOLIC PANEL
ALK PHOS: 58 U/L (ref 38–126)
ALT: 18 U/L (ref 17–63)
AST: 26 U/L (ref 15–41)
Albumin: 2.4 g/dL — ABNORMAL LOW (ref 3.5–5.0)
Anion gap: 14 (ref 5–15)
BUN: 16 mg/dL (ref 6–20)
CALCIUM: 10.9 mg/dL — AB (ref 8.9–10.3)
CO2: 23 mmol/L (ref 22–32)
CREATININE: 0.65 mg/dL (ref 0.61–1.24)
Chloride: 94 mmol/L — ABNORMAL LOW (ref 101–111)
GFR calc non Af Amer: >60 mL/min (ref >60)
Glucose, Bld: 86 mg/dL (ref 65–99)
Potassium: 4.1 mmol/L (ref 3.5–5.1)
SODIUM: 131 mmol/L — AB (ref 135–145)
Total Bilirubin: 0.6 mg/dL (ref 0.3–1.2)
Total Protein: 6.5 g/dL (ref 6.5–8.1)

## 2017-07-07 LAB — MRSA PCR SCREENING: MRSA BY PCR: POSITIVE — AB

## 2017-07-07 LAB — PREALBUMIN: Prealbumin: <5 mg/dL — ABNORMAL LOW (ref 18–38)

## 2017-07-07 LAB — PHOSPHORUS
Phosphorus: 3.7 mg/dL (ref 2.5–4.6)
Phosphorus: 3.1 mg/dL (ref 2.5–4.6)

## 2017-07-07 LAB — STREP PNEUMONIAE URINARY ANTIGEN: Strep Pneumo Urinary Antigen: NEGATIVE

## 2017-07-07 LAB — MAGNESIUM: MAGNESIUM: 1.2 mg/dL — AB (ref 1.7–2.4)

## 2017-07-07 MED ORDER — PRO-STAT SUGAR FREE PO LIQD
30.0000 mL | Freq: Two times a day (BID) | ORAL | Status: DC
  Administered 2017-07-07 – 2017-07-09 (×5): 30 mL
  Filled 2017-07-07 (×5): qty 30

## 2017-07-07 MED ORDER — FREE WATER
100.0000 mL | Freq: Four times a day (QID) | Status: DC
  Administered 2017-07-07 – 2017-07-09 (×8): 100 mL

## 2017-07-07 MED ORDER — MAGNESIUM SULFATE 2 GM/50ML IV SOLN
2.0000 g | Freq: Once | INTRAVENOUS | Status: AC
  Administered 2017-07-07: 2 g via INTRAVENOUS
  Filled 2017-07-07: qty 50

## 2017-07-07 MED ORDER — MAGNESIUM OXIDE 400 (241.3 MG) MG PO TABS
400.0000 mg | ORAL_TABLET | Freq: Two times a day (BID) | ORAL | Status: DC
  Administered 2017-07-07: 400 mg via ORAL
  Filled 2017-07-07: qty 1

## 2017-07-07 MED ORDER — CHLORHEXIDINE GLUCONATE CLOTH 2 % EX PADS
6.0000 | MEDICATED_PAD | Freq: Every day | CUTANEOUS | Status: DC
  Administered 2017-07-07: 6 via TOPICAL

## 2017-07-07 MED ORDER — LISINOPRIL 10 MG PO TABS: 20.0000 mg | ORAL_TABLET | Freq: Every day | ORAL | Status: DC

## 2017-07-07 MED ORDER — ACETAMINOPHEN 650 MG RE SUPP: 650.0000 mg | RECTAL | Status: DC | PRN

## 2017-07-07 MED ORDER — SENNA 8.6 MG PO TABS: 1.0000 | ORAL_TABLET | Freq: Every day | ORAL | Status: DC

## 2017-07-07 MED ORDER — LIDOCAINE VISCOUS HCL 2 % MT SOLN
15.0000 mL | Freq: Every day | OROMUCOSAL | Status: DC | PRN
  Filled 2017-07-07: qty 15

## 2017-07-07 MED ORDER — JUVEN PO PACK
1.0000 | PACK | Freq: Two times a day (BID) | ORAL | Status: DC
  Administered 2017-07-07 – 2017-07-09 (×4): 1
  Filled 2017-07-07 (×5): qty 1

## 2017-07-07 MED ORDER — GUAIFENESIN 100 MG/5ML PO SOLN
15.0000 mL | Freq: Four times a day (QID) | ORAL | Status: DC
  Administered 2017-07-07 – 2017-07-09 (×9): 300 mg
  Filled 2017-07-07: qty 10
  Filled 2017-07-07: qty 20
  Filled 2017-07-07 (×3): qty 10
  Filled 2017-07-07: qty 20
  Filled 2017-07-07: qty 10
  Filled 2017-07-07 (×3): qty 20
  Filled 2017-07-07: qty 10

## 2017-07-07 MED ORDER — LISINOPRIL 10 MG PO TABS
20.0000 mg | ORAL_TABLET | Freq: Every day | ORAL | Status: DC
  Filled 2017-07-07: qty 2

## 2017-07-07 MED ORDER — DEXTROSE 50 % IV SOLN
INTRAVENOUS | Status: AC
  Administered 2017-07-07: 25 mL
  Filled 2017-07-07: qty 50

## 2017-07-07 MED ORDER — ORAL CARE MOUTH RINSE
15.0000 mL | Freq: Two times a day (BID) | OROMUCOSAL | Status: DC
  Administered 2017-07-07 – 2017-07-09 (×5): 15 mL via OROMUCOSAL

## 2017-07-07 MED ORDER — VITAL HIGH PROTEIN PO LIQD: 1000.0000 mL | ORAL | Status: DC

## 2017-07-07 MED ORDER — MUPIROCIN 2 % EX OINT
1.0000 "application " | TOPICAL_OINTMENT | Freq: Two times a day (BID) | CUTANEOUS | Status: DC
  Administered 2017-07-07 – 2017-07-09 (×6): 1 via NASAL
  Filled 2017-07-07: qty 22

## 2017-07-07 MED ORDER — OXYCODONE HCL 5 MG/5ML PO SOLN
5.0000 mg | Freq: Four times a day (QID) | ORAL | Status: DC
  Administered 2017-07-07 – 2017-07-10 (×12): 5 mg
  Filled 2017-07-07 (×12): qty 5

## 2017-07-07 MED ORDER — OXYCODONE HCL 5 MG/5ML PO SOLN
10.0000 mg | ORAL | Status: DC | PRN
Start: 2017-07-07 — End: 2017-07-07

## 2017-07-07 MED ORDER — OXYCODONE HCL 5 MG PO TABS
10.0000 mg | ORAL_TABLET | ORAL | Status: DC | PRN
  Administered 2017-07-07: 10 mg via ORAL
  Filled 2017-07-07: qty 2

## 2017-07-07 MED ORDER — SENNOSIDES 8.8 MG/5ML PO SYRP
5.0000 mL | ORAL_SOLUTION | Freq: Every day | ORAL | Status: DC
  Administered 2017-07-07 – 2017-07-09 (×3): 5 mL
  Filled 2017-07-07 (×4): qty 5

## 2017-07-07 MED ORDER — ACETAMINOPHEN 160 MG/5ML PO SOLN
650.0000 mg | Freq: Four times a day (QID) | ORAL | Status: DC | PRN
  Administered 2017-07-07: 650 mg
  Filled 2017-07-07: qty 20.3

## 2017-07-07 MED ORDER — ENOXAPARIN SODIUM 30 MG/0.3ML ~~LOC~~ SOLN
30.0000 mg | SUBCUTANEOUS | Status: DC
  Administered 2017-07-07 – 2017-07-08 (×2): 30 mg via SUBCUTANEOUS
  Filled 2017-07-07 (×2): qty 0.3

## 2017-07-07 MED ORDER — CHLORHEXIDINE GLUCONATE 0.12 % MT SOLN
15.0000 mL | Freq: Two times a day (BID) | OROMUCOSAL | Status: DC
  Administered 2017-07-07 – 2017-07-09 (×6): 15 mL via OROMUCOSAL
  Filled 2017-07-07 (×4): qty 15

## 2017-07-07 MED ORDER — OXYCODONE HCL ER 10 MG PO T12A
10.0000 mg | EXTENDED_RELEASE_TABLET | Freq: Two times a day (BID) | ORAL | Status: DC
  Administered 2017-07-07: 10 mg via ORAL
  Filled 2017-07-07: qty 1

## 2017-07-07 MED ORDER — OSMOLITE 1.5 CAL PO LIQD
1000.0000 mL | ORAL | Status: DC
  Administered 2017-07-07 – 2017-07-09 (×2): 1000 mL
  Filled 2017-07-07 (×3): qty 1000

## 2017-07-07 MED ORDER — ONDANSETRON HCL 4 MG/5ML PO SOLN
4.0000 mg | Freq: Four times a day (QID) | ORAL | Status: DC | PRN
  Filled 2017-07-07: qty 5

## 2017-07-07 MED ORDER — ONDANSETRON HCL 4 MG/2ML IJ SOLN: 4.0000 mg | Freq: Four times a day (QID) | INTRAMUSCULAR | Status: DC | PRN

## 2017-07-07 NOTE — Progress Notes (Signed)
Palliative Medicine RN Note: Consult order noted. I spoke with [facility]. RN was at the house evaluating the patient, but they chose to go to ED instead of continuing with hospice admission and is NOT active with hospice. PMT will see Lawrence Arias when we have an available provider.  [name], RN, BSN, [facility] Palliative Medicine Team 07/07/2017 9:08 AM Office [phone]

## 2017-07-07 NOTE — Progress Notes (Signed)
[facility] Progress Note  Patient: Lawrence Arias:[id number]   PCP: System, Provider Not In DOB: 1960/07/26   DOA: 06/18/2017   DOS: 07/07/2017   Date of Service: the patient was seen and examined on 07/07/2017  Subjective: Patient is feeling better also has shortness of breath and cough.  No vomiting but feels nauseous.  No abdominal pain right now but did have some yesterday on admission.  Denies any diarrhea.  Brief hospital course: Pt. with PMH of HTN, chronic respiratory failure, metastatic SCC of the retromolar trigone s/p surgery, adjunctive radiotherapy, palliative chemotherapy, and s/p Trach/PEG; admitted on 07/08/2017, presented with complaint of shortnes of breath , was found to have aspiration pneumonia. Currently further plan is continue IV antibiotics and continue engaging in discussion with goals of care.  Assessment and Plan: 1.  Aspiration pneumonia. Acute hypoxic respiratory failure. Excessive secretions. Still on 40% FiO2. Oxygenation drops to high 80s occasionally on the same. Continue on the stepdown unit. Continue IV antibiotics. Follow-up on blood cultures and sputum culture. Continue chest PT. N.p.o., continue nutrition via PEG tube only. Check for residual.  2.  Hyponatremia.  Hypomagnesemia, hypercalcemia Likely poor p.o. intake. Monitor. Resolved.  3.  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 Malignant pleural effusion. Per oncology patient has a progressive disease S/P surgery, radiation, palliative chemotherapy. Recent CTs continues to show progression. Oncology recommended hospice for the patient as the patient is not a candidate for second line chemotherapy due to his poor performance status as well as poor nutritional status. Palliative care consulted. Patient has had a referral to hospice but has not established care with hospice yet. Continue current pain regimen.  4.  Protein calorie malnutrition,  severe Underweight. Prealbumin very low. Patient will be receiving Prostat twice daily Juven twice daily with Osmolite continuous tube feeding. Monitor for refeeding syndrome.  Diet: tube feeding DVT Prophylaxis: subcutaneous Heparin  Advance goals of care discussion: full code  Family Communication: no family was present at bedside, at the time of interview.   Disposition:  Discharge to be determined.  Consultants: Palliative care  Procedures: none  Antibiotics: Anti-infectives (From admission, onward)   Start     Dose/Rate Route Frequency Ordered Stop   07/07/17 2000  vancomycin (VANCOCIN) IVPB 750 mg/150 ml premix     750 mg 150 mL/hr over 60 Minutes Intravenous Every 24 hours 06/16/2017 1954     07/07/17 0400  ceFEPIme (MAXIPIME) 1 g in sodium chloride 0.9 % 100 mL IVPB     1 g 200 mL/hr over 30 Minutes Intravenous Every 8 hours 07/04/2017 1934 07/15/17 0359   06/26/2017 1930  vancomycin (VANCOCIN) IVPB 1000 mg/200 mL premix     1,000 mg 200 mL/hr over 60 Minutes Intravenous NOW 06/20/2017 1917 07/05/2017 2117   07/07/2017 1915  ceFEPIme (MAXIPIME) 2 g in sodium chloride 0.9 % 100 mL IVPB     2 g 200 mL/hr over 30 Minutes Intravenous  Once 06/28/2017 1909 06/23/2017 2007       Objective: Physical Exam: Vitals:   07/07/17 1511 07/07/17 1527 07/07/17 1537 07/07/17 1600  BP:    101/69  Pulse: (!) 105 (!) 101 (!) 104 (!) 108  Resp: (!) 30 (!) 21 (!) 21 (!) 22  Temp:      TempSrc:      SpO2: (!) 74% 97% (!) 88% 97%  Weight:      Height:        Intake/Output Summary (Last 24 hours) at 07/07/2017 1705  Last data filed at 07/07/2017 1600 Gross per 24 hour  Intake 3151.92 ml  Output 675 ml  Net 2476.92 ml   Filed Weights   06/16/2017 1631 06/20/2017 2200  Weight: 44 kg (97 lb) 41.1 kg (90 lb 9.7 oz)   General: Alert, Awake and Oriented to Time, Place and Person. Appear in mild distress, affect appropriate Eyes: PERRL, Conjunctiva normal ENT: Oral Mucosa clear moist. Neck:  difficult to assess JVD, no Abnormal Mass Or lumps Cardiovascular: S1 and S2 Present, aortic systolic Murmur, Peripheral Pulses Present Respiratory: increased respiratory effort, Bilateral Air entry equal and Decreased, no use of accessory muscle, Clear to Auscultation, no Crackles, no wheezes Abdomen: Bowel Sound present, Soft and no tenderness, no hernia Skin: no redness, no Rash, no induration Extremities: no Pedal edema, no calf tenderness Neurologic: Grossly no focal neuro deficit. Bilaterally Equal motor strength  Data Reviewed: CBC: Recent Labs  Lab 07/01/17 0847 06/30/2017 1716 07/07/17 0343  WBC 10.7* 7.3 4.9  NEUTROABS 9.2* 5.5  --   HGB 11.2* 11.0* 7.4*  HCT 32.1* 32.1* 23.1*  MCV 96.7 98.5 97.5  PLT 725* 516* 009*   Basic Metabolic Panel: Recent Labs  Lab 07/01/17 0847 06/24/2017 1716 07/07/17 0343 07/07/17 1215  NA 117* 131* 131*  --   K 4.3 3.9 4.1  --   CL 80* 91* 94*  --   CO2 25 28 23   --   GLUCOSE 100 94 86  --   BUN 7 17 16   --   CREATININE 0.73 0.75 0.65  --   CALCIUM 12.1* 12.2* 10.9*  --   MG 1.0*  --  1.2*  --   PHOS 3.9  --   --  3.7    Liver Function Tests: Recent Labs  Lab 07/01/17 0847 07/07/17 0343  AST 15 26  ALT 11 18  ALKPHOS 91 58  BILITOT 0.5 0.6  PROT 8.1 6.5  ALBUMIN 3.0* 2.4*   No results for input(s): LIPASE, AMYLASE in the last 168 hours. No results for input(s): AMMONIA in the last 168 hours. Coagulation Profile: No results for input(s): INR, PROTIME in the last 168 hours. Cardiac Enzymes: No results for input(s): CKTOTAL, CKMB, CKMBINDEX, TROPONINI in the last 168 hours. BNP (last 3 results) No results for input(s): PROBNP in the last 8760 hours. CBG: Recent Labs  Lab 07/07/17 1243 07/07/17 1324 07/07/17 1500  GLUCAP 65 92 98   Studies: Dg Chest 2 View  Result Date: 07/09/2017 CLINICAL DATA:  Shortness of breath. EXAM: CHEST - 2 VIEW COMPARISON:  Chest radiograph Jul 01, 2017 and chest CT May 162019 FINDINGS:  Port-A-Cath tip is at the superior vena cava. Tracheostomy tube tip is 6.3 cm above the carina. No pneumothorax. There is a persistent right pleural effusion with patchy airspace consolidation in both base regions, more on the left than on the right. Heart size and pulmonary vascular normal. No appreciable adenopathy. There are surgical clips in left axillary region. There are also surgical clips in the lower right neck region. No blastic or lytic bone lesions are identified. There old healed rib fractures on the right. IMPRESSION: Tube and catheter positions as described without pneumothorax. Right pleural effusion with bilateral lower lobe consolidation. Heart size normal. There is increase infiltrate in the left lower lobe compared to recent studies. Pleural effusion on the right essentially stable. Electronically Signed   By: Lowella Grip III M.D.   On: 06/19/2017 18:25    Scheduled Meds: . chlorhexidine  15 mL Mouth Rinse BID  . Chlorhexidine Gluconate Cloth  6 each Topical Q0600  . enoxaparin (LOVENOX) injection  30 mg Subcutaneous Q24H  . feeding supplement (PRO-STAT SUGAR FREE 64)  30 mL Per Tube BID  . free water  100 mL Per Tube Q6H  . guaiFENesin  15 mL Per Tube QID  . ipratropium-albuterol  3 mL Nebulization QID  . mouth rinse  15 mL Mouth Rinse q12n4p  . mupirocin ointment  1 application Nasal BID  . nutrition supplement (JUVEN)  1 packet Per Tube BID BM  . oxyCODONE  5 mg Per Tube QID  . sennosides  5 mL Per Tube Daily  . sodium chloride flush  3 mL Intravenous Q12H   Continuous Infusions: . sodium chloride 1,000 mL (07/07/17 1228)  . ceFEPime (MAXIPIME) IV Stopped (07/07/17 1307)  . feeding supplement (OSMOLITE 1.5 CAL) 1,000 mL (07/07/17 1339)  . vancomycin     PRN Meds: acetaminophen (TYLENOL) oral liquid 160 mg/5 mL **OR** acetaminophen, lidocaine, ondansetron **OR** ondansetron (ZOFRAN) IV  Time spent: 35 minutes  Author: Berle Mull, MD [facility] Pager:  [phone] 07/07/2017 5:05 PM  If 7PM-7AM, please contact night-coverage at [email or web address], password [facility]

## 2017-07-07 NOTE — Progress Notes (Signed)
Initial Nutrition Assessment  DOCUMENTATION CODES:   Severe malnutrition in context of chronic illness, Underweight  INTERVENTION:  - Will order 30 mL Prostat BID and Juven BID with Osmolite 1.5 @ 15 mL/hr to advance by 10 mL every 12 hours to reach goal rate of Osmolite 1.5 @ 45 mL/hr. At goal rate, this regimen will provide 1980 kcal, 98 grams of protein with 28 grams of amino acids, and 823 mL free water.  - Will order free water flush of 100 mL QID (400 mL/day).  Monitor magnesium, potassium, and phosphorus daily for at least 3 days, MD to replete as needed, as pt is at risk for refeeding syndrome given severe malnutrition, weight loss, current hypomagnesemia.   NUTRITION DIAGNOSIS:   Severe Malnutrition related to chronic illness, catabolic illness, cancer and cancer related treatments as evidenced by severe fat depletion, severe muscle depletion.  GOAL:   Patient will meet greater than or equal to 90% of their needs  MONITOR:   TF tolerance, Weight trends, Labs, Skin  REASON FOR ASSESSMENT:   Consult Enteral/tube feeding initiation and management, Assessment of nutrition requirement/status  ASSESSMENT:   57 y.o. male with medical history significant of HTN, chronic respiratory failure, metastatic SCC of the retromolar trigone s/p surgery, adjunctive radiotherapy, palliative chemotherapy, and s/p trach/PEG. He presented to the ED with complaints of SOB and productive cough progressively worsening over the last 2 weeks. Patient is nonverbal due to tracheostomy. Normally patient is oxygen dependent at 3 L cannula oxygen via trach collar at home. He reports having a productive cough with thick sputum production. Associated symptoms of wheezing, malaise, poor p.o. intake, and continued left-sided neck pain that radiates to the shoulder. He reports still taking in some liquids by mouth, but mostly utilizes his PEG tube. He admits to coughing when trying to take things by mouth. From  the review of office records with Dr. Lebron Arias and Dr. Alvy Arias, it appears chemotherapy was recently discontinued after repeat CT scan on 5/16 showing residual left neck mass with interval ulceration consistent with progressive disease.  Pt with trach collar (not on vent) and PEG. Pt able to mouth words but it is hard to read his lips. He is also able to nod/shake head in order to communicate. He was very drowsy during RD visit and had to be awaken 3 times during visit and very minimal information was able to be obtained. Pt does confirm that he was using PEG for nutrition PTA but no further information about this or any other nutrition-related items were able to be obtained.   No family/visitors present at this time. No papers available in paper chart. No TF formula listed in PTA medications in providers' notes. Pt was being followed by [facility] RD with last visit on 05/11/17. At that time, pt was not using PEG for nutrition and was consuming very soft foods that required minimal chewing, liquids, and was drinking 2 bottles of Ensure Plus TID. Per chart review, pt has lost 11 lbs (11% body weight) in the past 1 month; this is highly significant.  Medications reviewed; 2 g IV Mg sulfate x1 run today, 5 mL Senokot per PEG/day.  Labs reviewed; Na: 131 mmol/L, Cl: 94 mmol/L, Ca: 10.9 mg/dL, Mg: 1.2 mg/dL.  IVF: NS @ 125 mL/hr.      NUTRITION - FOCUSED PHYSICAL EXAM:    Most Recent Value  Orbital Region  Unable to assess  Upper Arm Region  Severe depletion  Thoracic and Lumbar Region  Severe  depletion  Buccal Region  Moderate depletion  Temple Region  Moderate depletion  Clavicle Bone Region  Severe depletion  Clavicle and Acromion Bone Region  Severe depletion  Scapular Bone Region  Unable to assess  Dorsal Hand  Mild depletion  Patellar Region  Severe depletion  Anterior Thigh Region  Severe depletion  Posterior Calf Region  Severe depletion  Edema (RD Assessment)  None  Hair  Reviewed  Eyes   Unable to assess  Mouth  Unable to assess  Skin  Reviewed  Nails  Reviewed       Diet Order:   Diet Order           Diet NPO time specified Except for: Sips with Meds  Diet effective now          EDUCATION NEEDS:   No education needs have been identified at this time  Skin:  Skin Assessment: Skin Integrity Issues: Skin Integrity Issues:: Stage III, Stage II, Other (Comment) Stage II: sacrum Stage III: R buttocks Other: L neck non-pressure area  Last BM:  5/21  Height:   Ht Readings from Last 1 Encounters:  06/26/2017 5\' 7"  (1.702 m)    Weight:   Wt Readings from Last 1 Encounters:  07/16/2017 90 lb 9.7 oz (41.1 kg)    Ideal Body Weight:  67.27 kg  BMI:  Body mass index is 14.19 kg/m.  Estimated Nutritional Needs:   Kcal:  1850-2050 (45-50 kcal/kg)  Protein:  82-103 grams (2-2.5 grams/kg)  Fluid:  >/= 2 L/day      Lawrence Matin, MS, RD, LDN, [facility] Inpatient Clinical Dietitian Pager # [phone] After hours/weekend pager # [phone]

## 2017-07-07 NOTE — Progress Notes (Signed)
CSW consult-hospice Patient   Per Palliative care note: Patient is not active with Hospice at this time.   Kathrin Greathouse, Latanya Presser, MSW Clinical Social Worker  [phone] 07/07/2017  11:05 AM

## 2017-07-08 ENCOUNTER — Other Ambulatory Visit: Payer: Self-pay

## 2017-07-08 ENCOUNTER — Inpatient Hospital Stay: Payer: Medicaid Other | Admitting: Hematology and Oncology

## 2017-07-08 DIAGNOSIS — C062 Malignant neoplasm of retromolar area: Secondary | ICD-10-CM

## 2017-07-08 DIAGNOSIS — C799 Secondary malignant neoplasm of unspecified site: Secondary | ICD-10-CM

## 2017-07-08 DIAGNOSIS — Z7189 Other specified counseling: Secondary | ICD-10-CM

## 2017-07-08 DIAGNOSIS — Z66 Do not resuscitate: Secondary | ICD-10-CM

## 2017-07-08 DIAGNOSIS — Z515 Encounter for palliative care: Secondary | ICD-10-CM

## 2017-07-08 DIAGNOSIS — C760 Malignant neoplasm of head, face and neck: Secondary | ICD-10-CM

## 2017-07-08 LAB — CBC WITH DIFFERENTIAL/PLATELET
BASOS PCT: 0 %
Basophils Absolute: 0 10*3/uL (ref 0.0–0.1)
EOS ABS: 0.1 10*3/uL (ref 0.0–0.7)
Eosinophils Relative: 1 %
HCT: 27.8 % — ABNORMAL LOW (ref 39.0–52.0)
Hemoglobin: 9.2 g/dL — ABNORMAL LOW (ref 13.0–17.0)
Lymphocytes Relative: 11 %
Lymphs Abs: 0.6 10*3/uL — ABNORMAL LOW (ref 0.7–4.0)
MCH: 33.3 pg (ref 26.0–34.0)
MCHC: 33.1 g/dL (ref 30.0–36.0)
MCV: 100.7 fL — AB (ref 78.0–100.0)
Monocytes Absolute: 0.7 10*3/uL (ref 0.1–1.0)
Monocytes Relative: 13 %
NEUTROS PCT: 75 %
Neutro Abs: 4.3 10*3/uL (ref 1.7–7.7)
PLATELETS: 381 10*3/uL (ref 150–400)
RBC: 2.76 MIL/uL — ABNORMAL LOW (ref 4.22–5.81)
RDW: 14 % (ref 11.5–15.5)
WBC: 5.7 10*3/uL (ref 4.0–10.5)

## 2017-07-08 LAB — COMPREHENSIVE METABOLIC PANEL
ALT: 16 U/L — AB (ref 17–63)
ANION GAP: 8 (ref 5–15)
AST: 18 U/L (ref 15–41)
Albumin: 2.4 g/dL — ABNORMAL LOW (ref 3.5–5.0)
Alkaline Phosphatase: 51 U/L (ref 38–126)
BUN: 19 mg/dL (ref 6–20)
CO2: 26 mmol/L (ref 22–32)
CREATININE: 0.6 mg/dL — AB (ref 0.61–1.24)
Calcium: 10.2 mg/dL (ref 8.9–10.3)
Chloride: 98 mmol/L — ABNORMAL LOW (ref 101–111)
Glucose, Bld: 114 mg/dL — ABNORMAL HIGH (ref 65–99)
Potassium: 3.6 mmol/L (ref 3.5–5.1)
SODIUM: 132 mmol/L — AB (ref 135–145)
Total Bilirubin: 0.3 mg/dL (ref 0.3–1.2)
Total Protein: 6.5 g/dL (ref 6.5–8.1)

## 2017-07-08 LAB — GLUCOSE, CAPILLARY
GLUCOSE-CAPILLARY: 92 mg/dL (ref 65–99)
GLUCOSE-CAPILLARY: 97 mg/dL (ref 65–99)
GLUCOSE-CAPILLARY: 106 mg/dL — AB (ref 65–99)
GLUCOSE-CAPILLARY: 121 mg/dL — AB (ref 65–99)
GLUCOSE-CAPILLARY: 118 mg/dL — AB (ref 65–99)
GLUCOSE-CAPILLARY: 124 mg/dL — AB (ref 65–99)

## 2017-07-08 LAB — PHOSPHORUS
PHOSPHORUS: 2.8 mg/dL (ref 2.5–4.6)
PHOSPHORUS: 2.4 mg/dL — AB (ref 2.5–4.6)

## 2017-07-08 LAB — MAGNESIUM: MAGNESIUM: 1.4 mg/dL — AB (ref 1.7–2.4)

## 2017-07-08 MED ORDER — MAGNESIUM SULFATE 2 GM/50ML IV SOLN
2.0000 g | Freq: Once | INTRAVENOUS | Status: AC
  Administered 2017-07-08: 2 g via INTRAVENOUS
  Filled 2017-07-08: qty 50

## 2017-07-08 NOTE — Progress Notes (Signed)
[facility] Progress Note  Patient: Lawrence Arias EXB:[id number]   PCP: System, Provider Not In DOB: May 25, 1960   DOA: 07/01/2017   DOS: 07/08/2017   Date of Service: the patient was seen and examined on 07/08/2017  Subjective: Patient is feeling better also has shortness of breath and cough.  No vomiting but feels nauseous.  No abdominal pain right now but did have some yesterday on admission.  Denies any diarrhea.  Brief hospital course: Pt. with PMH of HTN, chronic respiratory failure, metastatic SCC of the retromolar trigone s/p surgery, adjunctive radiotherapy, palliative chemotherapy, and s/p Trach/PEG; admitted on 06/25/2017, presented with complaint of shortnes of breath , was found to have aspiration pneumonia. Currently further plan is continue IV antibiotics and continue engaging in discussion with goals of care.  Assessment and Plan: 1.  Aspiration pneumonia. Acute hypoxic respiratory failure. Excessive secretions. Still on 40% FiO2. Oxygenation drops to high 80s occasionally on the same. Continue on the stepdown unit. Continue IV antibiotics. Follow-up on blood cultures and sputum culture. Continue chest PT. N.p.o., continue nutrition via PEG tube only. Check for residual. Due to excessive secretion I will hold off on the fluids and monitor.  Continue chest PT for which the patient will remain in stepdown.  2.  Hyponatremia.  Hypomagnesemia, hypercalcemia Likely poor p.o. intake. Monitor. Resolved.  3.  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 Malignant pleural effusion. Per oncology patient has a progressive disease S/P surgery, radiation, palliative chemotherapy. Recent CTs continues to show progression. Oncology recommended hospice for the patient as the patient is not a candidate for second line chemotherapy due to his poor performance status as well as poor nutritional status. Palliative care consulted. Patient has had a referral to  hospice but has not established care with hospice yet. Continue current pain regimen.  4.  Protein calorie malnutrition, severe Underweight. Prealbumin very low. Patient will be receiving Prostat twice daily Juven twice daily with Osmolite continuous tube feeding. Monitor for refeeding syndrome.  5.  Anemia from nutritional deficiency. Most likely due to patient's poor p.o. intake. Baseline hemoglobin is between 10-11. Patient presented with hemoglobin of 11, there is a hemoglobin of 7.4 which appears to be in Ater. Current hemoglobin is 9.2 which is appropriate and most likely dilutional from IV hydration. Monitor.  Diet: tube feeding DVT Prophylaxis: subcutaneous Heparin  Advance goals of care discussion: full code, inform the patient that his prognosis is very poor, he has a progressive cancer as well as with his malnutrition and now pneumonia patient remains at risk for poor outcome in future.  Informed the patient that patient will be seen by palliative care with discussion for goals of care  Family Communication: no family was present at bedside, at the time of interview.   Disposition:  Discharge to be determined.  Consultants: Palliative care  Procedures: none  Antibiotics: Anti-infectives (From admission, onward)   Start     Dose/Rate Route Frequency Ordered Stop   07/07/17 2000  vancomycin (VANCOCIN) IVPB 750 mg/150 ml premix     750 mg 150 mL/hr over 60 Minutes Intravenous Every 24 hours 06/25/2017 1954     07/07/17 0400  ceFEPIme (MAXIPIME) 1 g in sodium chloride 0.9 % 100 mL IVPB     1 g 200 mL/hr over 30 Minutes Intravenous Every 8 hours 07/14/2017 1934 07/15/17 0359   06/29/2017 1930  vancomycin (VANCOCIN) IVPB 1000 mg/200 mL premix     1,000 mg 200 mL/hr over 60 Minutes Intravenous NOW  06/21/2017 1917 06/17/2017 2117   06/18/2017 1915  ceFEPIme (MAXIPIME) 2 g in sodium chloride 0.9 % 100 mL IVPB     2 g 200 mL/hr over 30 Minutes Intravenous  Once 07/03/2017 1909 07/05/2017  2007       Objective: Physical Exam: Vitals:   07/08/17 1100 07/08/17 1200 07/08/17 1222 07/08/17 1300  BP: 109/78 110/77  (!) 130/94  Pulse: (!) 105 (!) 101  (!) 111  Resp: (!) 22 19  (!) 31  Temp:  98 F (36.7 C)    TempSrc:  Oral    SpO2: 99% 90% 92% 97%  Weight:      Height:        Intake/Output Summary (Last 24 hours) at 07/08/2017 1402 Last data filed at 07/08/2017 1000 Gross per 24 hour  Intake 3064 ml  Output 800 ml  Net 2264 ml   Filed Weights   06/25/2017 1631 06/27/2017 2200  Weight: 44 kg (97 lb) 41.1 kg (90 lb 9.7 oz)   General: Alert, Awake and Oriented to Time, Place and Person. Appear in mild distress, affect appropriate Eyes: PERRL, Conjunctiva normal ENT: Oral Mucosa clear moist. Neck: difficult to assess JVD, no Abnormal Mass Or lumps Cardiovascular: S1 and S2 Present, aortic systolic Murmur, Peripheral Pulses Present Respiratory: increased respiratory effort, Bilateral Air entry equal and Decreased, no use of accessory muscle, Clear to Auscultation, no Crackles, no wheezes Abdomen: Bowel Sound present, Soft and no tenderness, no hernia Skin: no redness, no Rash, no induration Extremities: no Pedal edema, no calf tenderness Neurologic: Grossly no focal neuro deficit. Bilaterally Equal motor strength  Data Reviewed: CBC: Recent Labs  Lab 07/14/2017 1716 07/07/17 0343 07/08/17 0812  WBC 7.3 4.9 5.7  NEUTROABS 5.5  --  4.3  HGB 11.0* 7.4* 9.2*  HCT 32.1* 23.1* 27.8*  MCV 98.5 97.5 100.7*  PLT 516* 507* 008   Basic Metabolic Panel: Recent Labs  Lab 06/23/2017 1716 07/07/17 0343 07/07/17 1215 07/07/17 1723 07/08/17 0451 07/08/17 0812  NA 131* 131*  --   --   --  132*  K 3.9 4.1  --   --   --  3.6  CL 91* 94*  --   --   --  98*  CO2 28 23  --   --   --  26  GLUCOSE 94 86  --   --   --  114*  BUN 17 16  --   --   --  19  CREATININE 0.75 0.65  --   --   --  0.60*  CALCIUM 12.2* 10.9*  --   --   --  10.2  MG  --  1.2*  --   --   --  1.4*  PHOS   --   --  3.7 3.1 2.8  --     Liver Function Tests: Recent Labs  Lab 07/07/17 0343 07/08/17 0812  AST 26 18  ALT 18 16*  ALKPHOS 58 51  BILITOT 0.6 0.3  PROT 6.5 6.5  ALBUMIN 2.4* 2.4*   No results for input(s): LIPASE, AMYLASE in the last 168 hours. No results for input(s): AMMONIA in the last 168 hours. Coagulation Profile: No results for input(s): INR, PROTIME in the last 168 hours. Cardiac Enzymes: No results for input(s): CKTOTAL, CKMB, CKMBINDEX, TROPONINI in the last 168 hours. BNP (last 3 results) No results for input(s): PROBNP in the last 8760 hours. CBG: Recent Labs  Lab 07/07/17 1913 07/07/17 2316 07/08/17 6761  07/08/17 0757 07/08/17 1105  GLUCAP 93 96 92 97 106*   Studies: No results found.  Scheduled Meds: . chlorhexidine  15 mL Mouth Rinse BID  . Chlorhexidine Gluconate Cloth  6 each Topical Q0600  . enoxaparin (LOVENOX) injection  30 mg Subcutaneous Q24H  . feeding supplement (PRO-STAT SUGAR FREE 64)  30 mL Per Tube BID  . free water  100 mL Per Tube Q6H  . guaiFENesin  15 mL Per Tube QID  . ipratropium-albuterol  3 mL Nebulization QID  . mouth rinse  15 mL Mouth Rinse q12n4p  . mupirocin ointment  1 application Nasal BID  . nutrition supplement (JUVEN)  1 packet Per Tube BID BM  . oxyCODONE  5 mg Per Tube QID  . sennosides  5 mL Per Tube Daily  . sodium chloride flush  3 mL Intravenous Q12H   Continuous Infusions: . ceFEPime (MAXIPIME) IV Stopped (07/08/17 1216)  . feeding supplement (OSMOLITE 1.5 CAL) 35 mL/hr at 07/08/17 1338  . vancomycin Stopped (07/07/17 2123)   PRN Meds: acetaminophen (TYLENOL) oral liquid 160 mg/5 mL **OR** acetaminophen, lidocaine, ondansetron **OR** ondansetron (ZOFRAN) IV  Time spent: 35 minutes  Author: Berle Mull, MD [facility] Pager: [phone] 07/08/2017 2:02 PM  If 7PM-7AM, please contact night-coverage at [email or web address], password [facility]

## 2017-07-08 NOTE — Consult Note (Signed)
IP PROGRESS NOTE  Subjective:  Patient is presently comfortable.  Denies uncontrolled pain.  Desats rapidly with any interruption of trach mask ventilation.  Objective: Vital signs in last 24 hours: Blood pressure 125/90, pulse (!) 110, temperature 97.6 F (36.4 C), temperature source Oral, resp. rate (!) 30, height 5\' 7"  (1.702 m), weight 90 lb 9.7 oz (41.1 kg), SpO2 95 %.  Intake/Output from previous day: 05/22 0701 - 05/23 0700 In: 4303.8 [I.V.:2937.5; NG/GT:466.3; IV Piggyback:450] Out: 675 [Urine:675]  Physical Exam: Alert, awake, oriented x3. HEENT: Significant cervical ulcerations on the left side consistent with presence of metastatic tumor.  Muscle wasting, asymmetry of face due to previous surgery on the left side.  Trach in place. Lungs: Coarse breath sounds bilaterally. Cardiac: Regular rate and rhythm no murmurs Abdomen: Soft, nontender nondistended.  PEG tube in place without erythema or drainage. Extremities: Severe muscle mass loss.  No peripheral edema. Portacath: without erythema  Lab Results: Recent Labs    07/07/17 0343 07/08/17 0812  WBC 4.9 5.7  HGB 7.4* 9.2*  HCT 23.1* 27.8*  PLT 507* 381    BMET Recent Labs    07/07/17 0343 07/08/17 0812  NA 131* 132*  K 4.1 3.6  CL 94* 98*  CO2 23 26  GLUCOSE 86 114*  BUN 16 19  CREATININE 0.65 0.60*  CALCIUM 10.9* 10.2    No results found for: CEA1  Studies/Results: Dg Chest 2 View  Result Date: 07/08/2017 CLINICAL DATA:  Shortness of breath. EXAM: CHEST - 2 VIEW COMPARISON:  Chest radiograph Jul 01, 2017 and chest CT May 162019 FINDINGS: Port-A-Cath tip is at the superior vena cava. Tracheostomy tube tip is 6.3 cm above the carina. No pneumothorax. There is a persistent right pleural effusion with patchy airspace consolidation in both base regions, more on the left than on the right. Heart size and pulmonary vascular normal. No appreciable adenopathy. There are surgical clips in left axillary region.  There are also surgical clips in the lower right neck region. No blastic or lytic bone lesions are identified. There old healed rib fractures on the right. IMPRESSION: Tube and catheter positions as described without pneumothorax. Right pleural effusion with bilateral lower lobe consolidation. Heart size normal. There is increase infiltrate in the left lower lobe compared to recent studies. Pleural effusion on the right essentially stable. Electronically Signed   By: Lowella Grip III M.D.   On: 06/19/2017 18:25    Medications: I have reviewed the patient's current medications.  Assessment/Plan: Lawrence Arias is a 57 y.o. 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  Previously treated with surgery followed by adjuvant chemoradiotherapy with subsequent relapse and metastatic spread of the disease for which patient recently has been treated with systemic chemotherapy.  He has received 2 cycles of carboplatin and paclitaxel and continued to rapidly deteriorate while on therapy.  Restaging CT scan shows interval progression of the disease.  At this time, patient has no options of treatment mainly due to rapid deterioration of his performance status.  He is admitted to the hospital for pneumonia, but even with treatment of infection, I highly doubt that he will recover well enough to be considered for any further cancer there is directed therapy.  With that in mind, and appreciating likely very limited remaining life span of the patient, I strongly advised him to proceed with hospice care to improve symptom control and comfort.  He appears to agree with  this approach.  Recommendations: - Care in the hospital under the direction of the hospitalist service. -Agree with hospice consultation --on discharge from the hospital, likely hospice at home or at a skilled nursing facility as the patient's care needs are increasing. -No further  cancer-directed therapy planned   LOS: 2 days   Ardath Sax, MD   07/08/2017, 10:31 AM

## 2017-07-08 NOTE — Progress Notes (Signed)
Nutrition Follow-up  DOCUMENTATION CODES:   Severe malnutrition in context of chronic illness, Underweight  INTERVENTION:  Prostat BID and Juven BID with Osmolite 1.5 continuing to advance by 10 mL every 12 hours to reach goal rate of Osmolite 1.5 @ 45 mL/hr. At goal rate, this regimen will provide 1980 kcal, 98 grams of protein with 28 grams of amino acids, and 823 mL free water.  Continue free water flush of 100 mL QID (400 mL/day).  Pt at high refeeding risk due to severe malnutrition, weight loss, and continued low magnesium. Continue to monitor refeeding labs: magnesium, potassium, and phosphorus daily.  NUTRITION DIAGNOSIS:   Severe Malnutrition related to chronic illness, catabolic illness, cancer and cancer related treatments as evidenced by severe fat depletion, severe muscle depletion   Ongoing  GOAL:   Patient will meet greater than or equal to 90% of their needs  Ongoing - progressing  MONITOR:   TF tolerance, Weight trends, Labs, Skin  REASON FOR ASSESSMENT:   Consult Enteral/tube feeding initiation and management, Assessment of nutrition requirement/status  ASSESSMENT:   57 y.o. male with medical history significant of HTN, chronic respiratory failure, metastatic SCC of the retromolar trigone s/p surgery, adjunctive radiotherapy, palliative chemotherapy, and s/p trach/PEG. He presented to the ED with complaints of SOB and productive cough progressively worsening over the last 2 weeks. Patient is nonverbal due to tracheostomy. Normally patient is oxygen dependent at 3 L cannula oxygen via trach collar at home. He reports having a productive cough with thick sputum production. Associated symptoms of wheezing, malaise, poor p.o. intake, and continued left-sided neck pain that radiates to the shoulder. He reports still taking in some liquids by mouth, but mostly utilizes his PEG tube. He admits to coughing when trying to take things by mouth. From the review of office  records with Dr. Lebron Conners and Dr. Alvy Bimler, it appears chemotherapy was recently discontinued after repeat CT scan on 5/16 showing residual left neck mass with interval ulceration consistent with progressive disease.  Pt awake on trach. Pt unble to speak due to trach, but was able to communicate via yes/no questions, pen/paper, and gestures. PTA pt was eating an apple with oatmeal or grits during the day with Ensure Plus QID (2 in am, 2 in pm). Pt reports that he only will put water through his tube. Pt reports tolerating his tube feedings well.   Medications reviewed: peridex, prostat BID, Juven, Osmolite 1.5 25 ml/hr, free water 100 ml Q6H, robitussin, oxycodone, senokot, magnesium, vancocin.   Labs reviewed: Na 132 (L), Cl 98 (L), BG 114 (H), creatinine 0.6 (L), K+ 3.6 (WNL), phosphorus 2.8 (WNL), magnesium 1.4 (L), albumin 2.4 (L), ALT 16 (L), RBC 2.76 (L), hemoglobin 9.2 (L), HCT 27.8 (L).   Diet Order:   Diet Order           Diet NPO time specified Except for: Sips with Meds  Diet effective now          EDUCATION NEEDS:   No education needs have been identified at this time  Skin:  Skin Assessment: Skin Integrity Issues: Skin Integrity Issues:: Stage III, Stage II, Other (Comment) Stage II: sacrum Stage III: R buttocks Other: L neck non-pressure area  Last BM:  5/21  Height:   Ht Readings from Last 1 Encounters:  06/26/2017 5\' 7"  (1.702 m)    Weight:   Wt Readings from Last 1 Encounters:  06/29/2017 90 lb 9.7 oz (41.1 kg)    Ideal Body Weight:  67.27 kg  BMI:  Body mass index is 14.19 kg/m.  Estimated Nutritional Needs:   Kcal:  1850-2050 (45-50 kcal/kg)  Protein:  82-103 grams (2-2.5 grams/kg)  Fluid:  >/= 2 L/day    Hope Budds, Dietetic Intern

## 2017-07-09 ENCOUNTER — Encounter: Payer: Self-pay | Admitting: *Deleted

## 2017-07-09 ENCOUNTER — Inpatient Hospital Stay (HOSPITAL_COMMUNITY): Payer: Medicaid Other

## 2017-07-09 DIAGNOSIS — J189 Pneumonia, unspecified organism: Secondary | ICD-10-CM

## 2017-07-09 LAB — CULTURE, RESPIRATORY

## 2017-07-09 LAB — GLUCOSE, CAPILLARY
GLUCOSE-CAPILLARY: 122 mg/dL — AB (ref 65–99)
Glucose-Capillary: 127 mg/dL — ABNORMAL HIGH (ref 65–99)
Glucose-Capillary: 119 mg/dL — ABNORMAL HIGH (ref 65–99)

## 2017-07-09 LAB — COMPREHENSIVE METABOLIC PANEL
ALBUMIN: 2.2 g/dL — AB (ref 3.5–5.0)
ALT: 13 U/L — ABNORMAL LOW (ref 17–63)
AST: 13 U/L — ABNORMAL LOW (ref 15–41)
Alkaline Phosphatase: 55 U/L (ref 38–126)
Anion gap: 9 (ref 5–15)
BILIRUBIN TOTAL: 0.2 mg/dL — AB (ref 0.3–1.2)
BUN: 20 mg/dL (ref 6–20)
CO2: 32 mmol/L (ref 22–32)
CREATININE: 0.54 mg/dL — AB (ref 0.61–1.24)
Calcium: 10.3 mg/dL (ref 8.9–10.3)
Chloride: 97 mmol/L — ABNORMAL LOW (ref 101–111)
GFR calc Af Amer: >60 mL/min (ref >60)
GFR calc non Af Amer: >60 mL/min (ref >60)
GLUCOSE: 129 mg/dL — AB (ref 65–99)
Potassium: 4 mmol/L (ref 3.5–5.1)
Sodium: 138 mmol/L (ref 135–145)
TOTAL PROTEIN: 6.4 g/dL — AB (ref 6.5–8.1)

## 2017-07-09 LAB — CBC WITH DIFFERENTIAL/PLATELET
BASOS PCT: 0 %
Basophils Absolute: 0 10*3/uL (ref 0.0–0.1)
Eosinophils Absolute: 0.1 10*3/uL (ref 0.0–0.7)
Eosinophils Relative: 1 %
HEMATOCRIT: 28.3 % — AB (ref 39.0–52.0)
HEMOGLOBIN: 8.8 g/dL — AB (ref 13.0–17.0)
LYMPHS PCT: 11 %
Lymphs Abs: 0.7 10*3/uL (ref 0.7–4.0)
MCH: 32.5 pg (ref 26.0–34.0)
MCHC: 31.1 g/dL (ref 30.0–36.0)
MCV: 104.4 fL — ABNORMAL HIGH (ref 78.0–100.0)
MONOS PCT: 11 %
Monocytes Absolute: 0.7 10*3/uL (ref 0.1–1.0)
NEUTROS ABS: 4.7 10*3/uL (ref 1.7–7.7)
NEUTROS PCT: 77 %
Platelets: 368 10*3/uL (ref 150–400)
RBC: 2.71 MIL/uL — ABNORMAL LOW (ref 4.22–5.81)
RDW: 13.8 % (ref 11.5–15.5)
WBC: 6.1 10*3/uL (ref 4.0–10.5)

## 2017-07-09 LAB — LACTIC ACID, PLASMA: Lactic Acid, Venous: 1.4 mmol/L (ref 0.5–1.9)

## 2017-07-09 LAB — CULTURE, RESPIRATORY W GRAM STAIN

## 2017-07-09 LAB — MAGNESIUM: Magnesium: 1.5 mg/dL — ABNORMAL LOW (ref 1.7–2.4)

## 2017-07-09 LAB — LEGIONELLA PNEUMOPHILA SEROGP 1 UR AG: L. pneumophila Serogp 1 Ur Ag: NEGATIVE

## 2017-07-09 MED ORDER — MORPHINE SULFATE (CONCENTRATE) 10 MG/0.5ML PO SOLN: 5.0000 mg | ORAL | Status: DC | PRN

## 2017-07-09 MED ORDER — ONDANSETRON 4 MG PO TBDP: 4.0000 mg | ORAL_TABLET | Freq: Four times a day (QID) | ORAL | Status: DC | PRN

## 2017-07-09 MED ORDER — HALOPERIDOL LACTATE 5 MG/ML IJ SOLN: 0.5000 mg | INTRAMUSCULAR | Status: DC | PRN

## 2017-07-09 MED ORDER — LORAZEPAM 2 MG/ML IJ SOLN: 1.0000 mg | INTRAMUSCULAR | Status: DC | PRN

## 2017-07-09 MED ORDER — GLYCOPYRROLATE 0.2 MG/ML IJ SOLN: 0.1000 mg | Freq: Every day | INTRAMUSCULAR | Status: DC

## 2017-07-09 MED ORDER — POLYVINYL ALCOHOL 1.4 % OP SOLN
1.0000 [drp] | Freq: Four times a day (QID) | OPHTHALMIC | Status: DC | PRN
  Filled 2017-07-09: qty 15

## 2017-07-09 MED ORDER — LORAZEPAM 2 MG/ML PO CONC: 1.0000 mg | ORAL | Status: DC | PRN

## 2017-07-09 MED ORDER — LIP MEDEX EX OINT
TOPICAL_OINTMENT | CUTANEOUS | Status: AC
  Administered 2017-07-09: 1
  Filled 2017-07-09: qty 7

## 2017-07-09 MED ORDER — ONDANSETRON HCL 4 MG/2ML IJ SOLN: 4.0000 mg | Freq: Four times a day (QID) | INTRAMUSCULAR | Status: DC | PRN

## 2017-07-09 MED ORDER — GLYCOPYRROLATE 1 MG PO TABS: 1.0000 mg | ORAL_TABLET | ORAL | Status: DC | PRN

## 2017-07-09 MED ORDER — GLYCOPYRROLATE 0.2 MG/ML IJ SOLN
0.2000 mg | INTRAMUSCULAR | Status: DC | PRN
  Administered 2017-07-10: 0.2 mg via INTRAVENOUS
  Filled 2017-07-09: qty 1

## 2017-07-09 MED ORDER — GLYCOPYRROLATE 0.2 MG/ML IJ SOLN
0.1000 mg | Freq: Two times a day (BID) | INTRAMUSCULAR | Status: DC
  Administered 2017-07-09: 0.1 mg via INTRAVENOUS
  Filled 2017-07-09: qty 1

## 2017-07-09 MED ORDER — LORAZEPAM 1 MG PO TABS: 1.0000 mg | ORAL_TABLET | ORAL | Status: DC | PRN

## 2017-07-09 MED ORDER — FUROSEMIDE 10 MG/ML IJ SOLN
40.0000 mg | Freq: Once | INTRAMUSCULAR | Status: AC
  Administered 2017-07-09: 40 mg via INTRAVENOUS
  Filled 2017-07-09: qty 4

## 2017-07-09 MED ORDER — MORPHINE SULFATE (PF) 2 MG/ML IV SOLN
2.0000 mg | INTRAVENOUS | Status: DC | PRN
  Administered 2017-07-10 (×2): 2 mg via INTRAVENOUS
  Filled 2017-07-09 (×2): qty 1

## 2017-07-09 MED ORDER — HALOPERIDOL LACTATE 2 MG/ML PO CONC
0.5000 mg | ORAL | Status: DC | PRN
  Administered 2017-07-10: 0.5 mg via SUBLINGUAL
  Filled 2017-07-09 (×2): qty 0.3

## 2017-07-09 MED ORDER — FREE WATER
100.0000 mL | Freq: Three times a day (TID) | Status: DC
  Administered 2017-07-09 – 2017-07-10 (×2): 100 mL

## 2017-07-09 MED ORDER — GLYCOPYRROLATE 0.2 MG/ML IJ SOLN: 0.2000 mg | INTRAMUSCULAR | Status: DC | PRN

## 2017-07-09 MED ORDER — HALOPERIDOL 1 MG PO TABS: 0.5000 mg | ORAL_TABLET | ORAL | Status: DC | PRN

## 2017-07-09 NOTE — Progress Notes (Signed)
Oncology Nurse Navigator Documentation  Visited [name] 0677 to check on his well-being.  Family had just left to get something to eat per his RN Pamala Hurry. He was resting comfortably in bed, alert, conversant as able. I expressed support and encouragement in context of present situation.  He acknowledged appreciation.  [name], RN, BSN Head & Neck Oncology Nurse [facility] [place] [phone]

## 2017-07-09 NOTE — Progress Notes (Signed)
[facility]  Patient Status: Active (receiving services up to time of hospitalization)  [facility] is providing the following services: RN  If patient discharges after hours, please call [phone].   Edwinna Areola 07/09/2017, 9:04 AM

## 2017-07-09 NOTE — Progress Notes (Signed)
[facility] PROGRESS NOTE  Patient: Lawrence Arias WUJ:[id number]   PCP: System, Provider Not In DOB: 1960-09-17   DOA: 06/23/2017   DOS: 07/09/2017    Assessment and plan: Attended the family meeting. Patient has declined progressively since this morning requiring frequent bagging. Chest x-ray shows enlarging pleural effusion which is likely loculated based on the CAT scan. Respiratory distress is worsened requiring frequent suctioning as well. Option was given to the patient on the family regarding progressing with aggressive intervention which will include thoracentesis and a possible chest tube placement due to her loculated effusion and using ventilator versus pursuing comfort care only. CCM was consulted. After reviewing patient's chart given that the patient has very poor nutrition, progressive cancer and worsening pneumonia it was felt that the patient would not benefit from aggressive intervention and patient was recommended comfort measures only. Along with palliative care met with family members including significant other, daughter, mother, brother.  They chose comfort measures and holding aggressive intervention. Orders were placed for palliative care and comfort. We will revisit the patient later in the afternoon regarding his requirement has a suspect that the patient will require continuous morphine infusion given his severe respiratory distress. Continue to monitor the stepdown unit tonight. Anticipating hospital death.  Author: Berle Mull, MD [facility] Pager: [phone] 07/09/2017 3:15 PM   If 7PM-7AM, please contact night-coverage at [email or web address], password [facility]

## 2017-07-09 NOTE — Consult Note (Signed)
Consultation Note Date: 07/09/2017   Patient Name: Lawrence Arias  DOB: [birth year]  MRN: [id number]  Age / Sex: 57 y.o., male  PCP: System, Provider Not In Referring Physician: Lavina Hamman, MD  Reason for Consultation: Establishing goals of care  HPI/Patient Profile: 57 y.o. 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   Clinical Assessment and Goals of Care: I met today with Lawrence Arias.  I introduced palliative care as specialized medical care for people living with serious illness. It focuses on providing relief from the symptoms and stress of a serious illness. The goal is to improve quality of life for both the patient and the family.  He is nonverbal secondary to having trach but does mouth words and answers questions appropriately through nodding.  He currently denies complaints.  I began to review with him regarding his clinical course and the fact that he has advanced cancer is unlikely to benefit from further disease modifying therapy.  Does indicate that he has been told this before.  He then indicated that he would like to have family present for further discussions about long-term goals moving forward.  I asked him specifically whom should be involved and he reports that his daughter, his mother, and his long-term significant other need to be here for conversation.  Questions and concerns addressed.   PMT will continue to support holistically.  SUMMARY OF RECOMMENDATIONS   - Full scope treatment - He would like to set up meeting with his family present prior to discussing further.  I spoke with his daughter and family, including his  daughter, mother and significant other, will be here tomorrow sometime after 2PM for meeting.  Code Status/Advance Care Planning: Full code   Prognosis:   < 6 months  Discharge Planning: To Be Determined      Primary Diagnoses: Present on Admission: . Acute on chronic respiratory failure with hypoxia ([place]) . Pressure injury of skin . Hypercalcemia . Squamous cell cancer of retromolar trigone ([place]) . Hyponatremia . Benign essential HTN   I have reviewed the medical record, interviewed the patient and family, and examined the patient. The following aspects are pertinent.  Past Medical History:  Diagnosis Date  . Cancer ([place])   . GERD (gastroesophageal reflux disease)    uses OTC prn  . History of radiation therapy 12/31/16- 02/23/2017   Left oral cavity and regional nodes, 66 Gy in 33 fractions.   . Hypertension   . Tongue lesion    Social History   Socioeconomic History  . Marital status: Single    Spouse name: Not on file  . Number of children: 3  . Years of education: Not on file  . Highest education level: Not on file  Occupational History  . Not on file  Social Needs  . Financial resource strain: Not  on file  . Food insecurity:    Worry: Not on file    Inability: Not on file  . Transportation needs:    Medical: Not on file    Non-medical: Not on file  Tobacco Use  . Smoking status: Current Some Day Smoker    Packs/day: 0.50    Types: Cigarettes  . Smokeless tobacco: Never Used  . Tobacco comment: he is smoking 0ne every other day (11/20/16)  Substance and Sexual Activity  . Alcohol use: Not on file  . Drug use: No  . Sexual activity: Not on file  Lifestyle  . Physical activity:    Days per week: Not on file    Minutes per session: Not on file  . Stress: Not on file  Relationships  . Social connections:    Talks on phone: Not on file    Gets together: Not on file    Attends religious service: Not on file    Active member of club or  organization: Not on file    Attends meetings of clubs or organizations: Not on file    Relationship status: Not on file  Other Topics Concern  . Not on file  Social History Narrative  . Not on file   Family History  Problem Relation Age of Onset  . Hypertension Mother   . Hypertension Father    Scheduled Meds: . chlorhexidine  15 mL Mouth Rinse BID  . Chlorhexidine Gluconate Cloth  6 each Topical Q0600  . enoxaparin (LOVENOX) injection  30 mg Subcutaneous Q24H  . free water  100 mL Per Tube Q6H  . glycopyrrolate  0.1 mg Intravenous BID  . guaiFENesin  15 mL Per Tube QID  . ipratropium-albuterol  3 mL Nebulization QID  . mouth rinse  15 mL Mouth Rinse q12n4p  . mupirocin ointment  1 application Nasal BID  . nutrition supplement (JUVEN)  1 packet Per Tube BID BM  . oxyCODONE  5 mg Per Tube QID  . sennosides  5 mL Per Tube Daily  . sodium chloride flush  3 mL Intravenous Q12H   Continuous Infusions: . ceFEPime (MAXIPIME) IV Stopped (07/09/17 0347)  . vancomycin Stopped (07/08/17 2322)   PRN Meds:.acetaminophen (TYLENOL) oral liquid 160 mg/5 mL **OR** acetaminophen, lidocaine, ondansetron **OR** ondansetron (ZOFRAN) IV Medications Prior to Admission:  Prior to Admission medications   Medication Sig Start Date End Date Taking? Authorizing Provider  acetaminophen (TYLENOL) 500 MG tablet Take 1,000 mg by mouth every 6 (six) hours as needed for moderate pain.   Yes [provider]  lidocaine (XYLOCAINE) 2 % solution Use as directed 15 mLs in the mouth or throat daily as needed for mouth pain.   Yes [provider]  lisinopril (PRINIVIL,ZESTRIL) 20 MG tablet Take 20 mg by mouth daily. 05/23/17  Yes [provider]  magnesium oxide (MAG-OX) 400 (241.3 Mg) MG tablet Take 1 tablet (400 mg total) by mouth 2 (two) times daily. 05/21/17  Yes Gorsuch, Ni, MD  ondansetron (ZOFRAN) 8 MG tablet Take 1 tablet (8 mg total) by mouth every 8 (eight) hours as needed for  nausea. 05/21/17  Yes Heath Lark, MD  oxyCODONE (OXYCONTIN) 10 mg 12 hr tablet Take 1 tablet (10 mg total) by mouth every 12 (twelve) hours for 14 days. 07/01/17 07/15/17 Yes Perlov, Marinell Blight, MD  fluconazole (DIFLUCAN) 100 MG tablet Take 2 tablets today, then 1 tablet daily x 6 more days. Patient not taking: Reported on 06/18/2017 06/18/17  Eppie Gibson, MD  Oxycodone HCl 10 MG TABS Take 1 tablet (10 mg total) by mouth every 4 (four) hours as needed for up to 14 days. 07/01/17 07/15/17  Ardath Sax, MD  polyethylene glycol powder [facility]) powder Use as directed for constipation. Follow directions on container. Patient not taking: Reported on 07/02/2017 01/11/17   Eppie Gibson, MD   No Known Allergies Review of Systems  Constitutional: Positive for activity change, fatigue and fever.  Respiratory: Positive for shortness of breath.   Psychiatric/Behavioral: Positive for sleep disturbance.    Physical Exam  General: Alert, awake, in no acute distress.  HEENT: No bruits, no goiter, no JVD, trach in place Heart: Regular rate and rhythm. Murmur appreciated. Lungs: Decreased air movement, crackles/rhonchi scattered throuhgout Abdomen: Soft, nontender, nondistended, positive bowel sounds.  Ext: No significant edema Skin: Warm and dry Neuro: Grossly intact, nonfocal.   Vital Signs: BP 104/65   Pulse (!) 125   Temp 98.4 F (36.9 C) (Oral)   Resp (!) 25   Ht _0  (1.702 m)   Wt 45.5 kg (100 lb 5 oz)   SpO2 95%   BMI 15.71 kg/m  Pain Scale: 0-10   Pain Score: 0-No pain   SpO2: SpO2: 95 % O2 Device:SpO2: 95 % O2 Flow Rate: .O2 Flow Rate (L/min): 10 L/min  IO: Intake/output summary:   Intake/Output Summary (Last 24 hours) at 07/09/2017 1059 Last data filed at 07/09/2017 0700 Gross per 24 hour  Intake 1167.84 ml  Output 825 ml  Net 342.84 ml    LBM: Last BM Date: 06/18/2017 Baseline Weight: Weight: 44 kg (97 lb) Most recent weight: Weight: 45.5 kg (100 lb 5 oz)       Palliative Assessment/Data:   Flowsheet Rows     Most Recent Value  Intake Tab  Referral Department  Hospitalist  Unit at Time of Referral  Intermediate Care Unit  Palliative Care Primary Diagnosis  Cancer  Date Notified  07/07/17  Clinical Assessment  Psychosocial & Spiritual Assessment  Palliative Care Outcomes     Time Total: 50 Greater than 50%  of this time was spent counseling and coordinating care related to the above assessment and plan.  Signed by: Micheline Rough, MD   Please contact Palliative Medicine Team phone at [phone] for questions and concerns.  For individual provider: See Shea Evans

## 2017-07-09 NOTE — Progress Notes (Addendum)
[facility] Progress Note  Patient: Lawrence Arias DJS:[id number]   PCP: System, Provider Not In DOB: 07-30-1960   DOA: 07/05/2017   DOS: 07/09/2017   Date of Service: the patient was seen and examined on 07/09/2017  Subjective: Continues to have shortness of breath, work of breathing appears to have increased today.  Overnight had episodes of desaturation probably from excessive mucus requiring's suctioning and bagging. Patient himself denies any acute complaint and feels about the same as yesterday.  Brief hospital course: Pt. with PMH of HTN, chronic respiratory failure, metastatic SCC of the retromolar trigone s/p surgery, adjunctive radiotherapy, palliative chemotherapy, and s/p Trach/PEG; admitted on 07/15/2017, presented with complaint of shortnes of breath , was found to have aspiration pneumonia. Currently further plan is continue IV antibiotics and continue engaging in discussion with goals of care.  Assessment and Plan: 1.  Aspiration pneumonia.  Bacterial, Staphylococcus aureus From sputum culture Acute hypoxic respiratory failure. Excessive secretions. Still on 40% FiO2. Oxygenation drops to high 80s occasionally on the same. Continue on the stepdown unit. Continue IV antibiotics.  Started on vancomycin and cefepime since admission, sputum culture is positive for staph aureus and MRSA PCR is positive and therefore vancomycin will be continued awaiting sensitivity. I would also continue to cover him with cefepime or any other gram-negative agent to cover him for aspiration. Follow-up on blood cultures and sputum culture. Continue chest PT. N.p.o., continue nutrition via PEG tube only. Check for residual. Due to excessive secretion start the patient on Robinul  Continue chest PT for which the patient will remain in stepdown. Already on duo nebs and Mucinex. Prognosis is poor for patient to respond favorably to pneumonia treatment, given patient's extensive cancer, poor  nutritional status and possibility of recurrent aspiration  2.  Hyponatremia.  Hypomagnesemia, hypercalcemia Likely poor p.o. intake. Monitor. Resolved.  3.  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 Malignant pleural effusion. Per oncology patient has a progressive disease S/P surgery, radiation, palliative chemotherapy. Recent CTs continues to show progression. Oncology recommended hospice for the patient as the patient is not a candidate for second line chemotherapy due to his poor performance status as well as poor nutritional status. Palliative care consulted. Patient has had a referral to hospice but has not established care with hospice yet. Continue current pain regimen.  4.  Protein calorie malnutrition, severe Underweight. Prealbumin very low. Patient will be receiving Prostat twice daily Juven twice daily with Osmolite continuous tube feeding. Monitor for refeeding syndrome.  5.  Anemia from nutritional deficiency. Most likely due to patient's poor p.o. intake. Baseline hemoglobin is between 10-11. Patient presented with hemoglobin of 11, there is a hemoglobin of 7.4 which appears to be in A[place]. Current hemoglobin is 9.2 which is appropriate and most likely dilutional from IV hydration. Monitor.  Diet: tube feeding DVT Prophylaxis: subcutaneous Heparin  Advance goals of care discussion: full code, inform the patient that his prognosis is very poor, he has a progressive cancer as well as with his malnutrition and now pneumonia patient remains at risk for poor outcome in future.  Informed the patient that patient will be seen by palliative care with discussion for goals of care.  I suspect that patient will benefit from transitioning to hospice with complete comfort, may even be a candidate for inpatient hospice given progressive pneumonia.  Family Communication: no family was present at bedside, at the time of interview.  Family meeting scheduled later with  palliative care, will try to be part  of the meeting, informed RN.  Disposition:  Discharge to be determined.  Consultants: Palliative care  Procedures: none  Antibiotics: Anti-infectives (From admission, onward)   Start     Dose/Rate Route Frequency Ordered Stop   07/07/17 2000  vancomycin (VANCOCIN) IVPB 750 mg/150 ml premix     750 mg 150 mL/hr over 60 Minutes Intravenous Every 24 hours 07/04/2017 1954     07/07/17 0400  ceFEPIme (MAXIPIME) 1 g in sodium chloride 0.9 % 100 mL IVPB     1 g 200 mL/hr over 30 Minutes Intravenous Every 8 hours 06/16/2017 1934 07/15/17 0359   07/04/2017 1930  vancomycin (VANCOCIN) IVPB 1000 mg/200 mL premix     1,000 mg 200 mL/hr over 60 Minutes Intravenous NOW 07/02/2017 1917 06/20/2017 2117   07/05/2017 1915  ceFEPIme (MAXIPIME) 2 g in sodium chloride 0.9 % 100 mL IVPB     2 g 200 mL/hr over 30 Minutes Intravenous  Once 07/01/2017 1909 07/12/2017 2007       Objective: Physical Exam: Vitals:   07/09/17 0404 07/09/17 0500 07/09/17 0700 07/09/17 0800  BP: 109/80 (!) 132/94 108/69 119/82  Pulse: (!) 110 (!) 113 (!) 104 (!) 115  Resp: (!) 29 (!) 30 19 (!) 28  Temp:    98.4 F (36.9 C)  TempSrc:    Oral  SpO2: 96% 93% 99% 95%  Weight:  45.5 kg (100 lb 5 oz)    Height:        Intake/Output Summary (Last 24 hours) at 07/09/2017 8250 Last data filed at 07/09/2017 0700 Gross per 24 hour  Intake 1295.84 ml  Output 825 ml  Net 470.84 ml   Filed Weights   06/26/2017 1631 07/05/2017 2200 07/09/17 0500  Weight: 44 kg (97 lb) 41.1 kg (90 lb 9.7 oz) 45.5 kg (100 lb 5 oz)   General: Alert, Awake and Oriented to Time, Place and Person. Appear in mild distress, affect appropriate Eyes: PERRL, Conjunctiva normal ENT: Oral Mucosa clear moist. Neck: difficult to assess JVD, no Abnormal Mass Or lumps Cardiovascular: S1 and S2 Present, aortic systolic Murmur, Peripheral Pulses Present Respiratory: increased respiratory effort, Bilateral Air entry equal and Decreased,  positive use of accessory muscle, bilateral crackles and rhonchi, no wheezes Abdomen: Bowel Sound present, Soft and no tenderness, no hernia Skin: no redness, no Rash, no induration Extremities: no Pedal edema, no calf tenderness Neurologic: Grossly no focal neuro deficit. Bilaterally Equal motor strength  Data Reviewed: CBC: Recent Labs  Lab 07/01/2017 1716 07/07/17 0343 07/08/17 0812  WBC 7.3 4.9 5.7  NEUTROABS 5.5  --  4.3  HGB 11.0* 7.4* 9.2*  HCT 32.1* 23.1* 27.8*  MCV 98.5 97.5 100.7*  PLT 516* 507* 539   Basic Metabolic Panel: Recent Labs  Lab 06/18/2017 1716 07/07/17 0343 07/07/17 1215 07/07/17 1723 07/08/17 0451 07/08/17 0812 07/08/17 1845  NA 131* 131*  --   --   --  132*  --   K 3.9 4.1  --   --   --  3.6  --   CL 91* 94*  --   --   --  98*  --   CO2 28 23  --   --   --  26  --   GLUCOSE 94 86  --   --   --  114*  --   BUN 17 16  --   --   --  19  --   CREATININE 0.75 0.65  --   --   --  0.60*  --   CALCIUM 12.2* 10.9*  --   --   --  10.2  --   MG  --  1.2*  --   --   --  1.4*  --   PHOS  --   --  3.7 3.1 2.8  --  2.4*    Liver Function Tests: Recent Labs  Lab 07/07/17 0343 07/08/17 0812  AST 26 18  ALT 18 16*  ALKPHOS 58 51  BILITOT 0.6 0.3  PROT 6.5 6.5  ALBUMIN 2.4* 2.4*   No results for input(s): LIPASE, AMYLASE in the last 168 hours. No results for input(s): AMMONIA in the last 168 hours. Coagulation Profile: No results for input(s): INR, PROTIME in the last 168 hours. Cardiac Enzymes: No results for input(s): CKTOTAL, CKMB, CKMBINDEX, TROPONINI in the last 168 hours. BNP (last 3 results) No results for input(s): PROBNP in the last 8760 hours. CBG: Recent Labs  Lab 07/08/17 1628 07/08/17 1914 07/08/17 2304 07/09/17 0322 07/09/17 0735  GLUCAP 121* 118* 124* 122* 127*   Studies: Dg Chest Port 1 View  Result Date: 07/09/2017 CLINICAL DATA:  Shortness of breath EXAM: PORTABLE CHEST 1 VIEW COMPARISON:  06/21/2017 FINDINGS: Cardiac  shadow is obscured by large right-sided pleural effusion significantly increased from the prior exam. Patchy infiltrate is noted in the residual right lung as well as diffusely throughout the left lung consistent with multifocal infiltrates. Tracheostomy tube and right chest wall port are noted in satisfactory position. No pneumothorax is seen. No acute bony abnormality is noted. Multiple old rib fractures are again seen on the right. IMPRESSION: Enlarging right-sided pleural effusion with multifocal bilateral infiltrates. Electronically Signed   By: Inez Catalina M.D.   On: 07/09/2017 08:50    Scheduled Meds: . chlorhexidine  15 mL Mouth Rinse BID  . Chlorhexidine Gluconate Cloth  6 each Topical Q0600  . enoxaparin (LOVENOX) injection  30 mg Subcutaneous Q24H  . feeding supplement (PRO-STAT SUGAR FREE 64)  30 mL Per Tube BID  . free water  100 mL Per Tube Q6H  . glycopyrrolate  0.1 mg Intravenous BID  . guaiFENesin  15 mL Per Tube QID  . ipratropium-albuterol  3 mL Nebulization QID  . mouth rinse  15 mL Mouth Rinse q12n4p  . mupirocin ointment  1 application Nasal BID  . nutrition supplement (JUVEN)  1 packet Per Tube BID BM  . oxyCODONE  5 mg Per Tube QID  . sennosides  5 mL Per Tube Daily  . sodium chloride flush  3 mL Intravenous Q12H   Continuous Infusions: . ceFEPime (MAXIPIME) IV Stopped (07/09/17 0347)  . feeding supplement (OSMOLITE 1.5 CAL) 1,000 mL (07/09/17 0505)  . vancomycin Stopped (07/08/17 2322)   PRN Meds: acetaminophen (TYLENOL) oral liquid 160 mg/5 mL **OR** acetaminophen, lidocaine, ondansetron **OR** ondansetron (ZOFRAN) IV  Time spent: The patient is critically ill with multiple organ systems failure and requires high complexity decision making for assessment and support, frequent evaluation and titration of therapies. Critical Care Time devoted to patient care services described in this note is 35 minutes   Author: Berle Mull, MD [facility] Pager:  [phone] 07/09/2017 9:27 AM  If 7PM-7AM, please contact night-coverage at [email or web address], password [facility]

## 2017-07-09 NOTE — Progress Notes (Signed)
Pt had two desaturation events overnight into the 70s that required lavage, bag and suction x2 for thick copious secretions.  RN aware and assisted me both times.  Pt tolerated well with improvement in O2 saturation of 92% and greater.  Pt remains on 98% atc.  RT will continue to monitor and assess as needed.

## 2017-07-09 NOTE — Progress Notes (Signed)
Palliative Care Progress Note  Lawrence Arias has continued to decline.  Chart reviewed and discussed with bedside RN, Dr. Posey Pronto, and PCCM.  I met today with family including his daughter, mother, long term significant other, brother, and cousin in conjunction with Dr. Posey Pronto.  Values and goals of care important to patient and family were attempted to be elicited.  We discussed clinical course as well as wishes moving forward in light of his continued decline despite interventions.  We discussed difference between a aggressive medical intervention path and a palliative, comfort focused care path.    His family is in agreement that goal at this time should be a focus on comfort and dignity as he approaches end of life.  Plan for continuation of comfort meds prn.  I would have a low threshold to start continuous infusion of needed to maintain his comfort.  Questions and concerns addressed.   PMT will continue to support holistically.  Total time: 50 minutes  Greater than 50%  of this time was spent counseling and coordinating care related to the above assessment and plan.  Micheline Rough, MD [place] Team [phone]

## 2017-07-09 NOTE — Progress Notes (Signed)
Nutrition Follow-up  DOCUMENTATION CODES:   Severe malnutrition in context of chronic illness, Underweight  INTERVENTION:  - Will monitor for GOC. - If TF to be re-started, continue currently ordered regimen.  NUTRITION DIAGNOSIS:   Severe Malnutrition related to chronic illness, catabolic illness, cancer and cancer related treatments as evidenced by severe fat depletion, severe muscle depletion. -ongoing  GOAL:   Patient will meet greater than or equal to 90% of their needs -previously met, unmet at this time.   MONITOR:   Weight trends, Labs, Skin, Other (Comment)(GOC)  ASSESSMENT:   57 y.o. male with medical history significant of HTN, chronic respiratory failure, metastatic SCC of the retromolar trigone s/p surgery, adjunctive radiotherapy, palliative chemotherapy, and s/p trach/PEG. He presented to the ED with complaints of SOB and productive cough progressively worsening over the last 2 weeks. Patient is nonverbal due to tracheostomy. Normally patient is oxygen dependent at 3 L cannula oxygen via trach collar at home. He reports having a productive cough with thick sputum production. Associated symptoms of wheezing, malaise, poor p.o. intake, and continued left-sided neck pain that radiates to the shoulder. He reports still taking in some liquids by mouth, but mostly utilizes his PEG tube. He admits to coughing when trying to take things by mouth. From the review of office records with Dr. Lebron Conners and Dr. Alvy Bimler, it appears chemotherapy was recently discontinued after repeat CT scan on 5/16 showing residual left neck mass with interval ulceration consistent with progressive disease.  Weight +10 lbs/4.4 kg compared to admission weight. Pt with trach and PEG. TF was increased to goal rate: Osmolite 1.5 @ 45 mL/hr with Prostat BID, Juven BID, and 100 mL free water QID at 5:00 AM today. This regimen provides 1980 kcal, 98 grams of protein with 28 grams of amino acids, and 1223 mL free  water.  TF currently on hold. Spoke with Agricultural consultant outside of pt's room and he reports pt may transition to comfort care. No family/visitors present at bedside. Reviewed Dr. Serita Grit note from this AM which states "I suspect that patient will benefit from transitioning to hospice with complete comfort, may even be a candidate for inpatient hospice given progressive pneumonia."  Medications reviewed; 40 mg IV Lasix x1 today, 5 mL Senokot per PEG/day.  Labs reviewed; CBGs: 122, 127, and 119 mg/dL today, Cl: 97 mmol/L, creatinine: 0.54 mg/dL, Mg: 1.5 mg/dL (trending up), Phos: 2.4 mg/dL.    Diet Order:   Diet Order           Diet NPO time specified Except for: Sips with Meds  Diet effective now          EDUCATION NEEDS:   No education needs have been identified at this time  Skin:  Skin Assessment: Skin Integrity Issues: Skin Integrity Issues:: Stage III, Stage II, Other (Comment) Stage II: sacrum Stage III: R buttocks Other: L neck non-pressure area  Last BM:  5/21  Height:   Ht Readings from Last 1 Encounters:  06/23/2017 '5\' 7"'$  (1.702 m)    Weight:   Wt Readings from Last 1 Encounters:  07/09/17 100 lb 5 oz (45.5 kg)    Ideal Body Weight:  67.27 kg  BMI:  Body mass index is 15.71 kg/m.  Estimated Nutritional Needs:   Kcal:  1850-2050 (45-50 kcal/kg)  Protein:  82-103 grams (2-2.5 grams/kg)  Fluid:  >/= 2 L/day      Jarome Matin, MS, RD, LDN, CNSC Inpatient Clinical Dietitian Pager # [phone] After hours/weekend pager #  [phone]

## 2017-07-09 NOTE — Progress Notes (Signed)
[place] by primary service due to respiratory distress.    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  He has been admitted with MRSA PNA, pleural effusion.  The patient was seen by Oncology and flet there were no other therapies to offer from an oncologic standpoint.  He has required frequent suctioning and bagging via trach in the last few hours.  Saturations dropped to as low as 80%.  He is cachectic, waxing / waning mental status and tachypnea.  He appears to be transitioning toward death.  I called his significant other and discussed his history and current clinical status.  She indicated she wanted me to talk with his daughter [name]).  I called her and she was in transit.  On her arrival, we discussed his critical illness with metastatic cancer and new MRSA PNA.   He has unfortunately declined this am.  She indicates that she agreed he has declined.  We reviewed that aggressive interventions at this point would not likely change his outcome and she agrees.  Specifically, we discussed that his goals should be comfort focused and she agreed.  DNR/DNI, comfort measures.  Discussed with Dr. Posey Pronto.    Lawrence Gens, NP-C [facility] Pgr: [phone] or if no answer [phone] 07/09/2017, 12:25 PM

## 2017-07-10 MED ORDER — IPRATROPIUM-ALBUTEROL 0.5-2.5 (3) MG/3ML IN SOLN: 3.0000 mL | RESPIRATORY_TRACT | Status: DC | PRN

## 2017-07-10 MED ORDER — MORPHINE 100MG IN NS 100ML (1MG/ML) PREMIX INFUSION
1.0000 mg/h | INTRAVENOUS | Status: DC
  Administered 2017-07-10: 1 mg/h via INTRAVENOUS
  Filled 2017-07-10: qty 100

## 2017-07-11 LAB — CULTURE, BLOOD (ROUTINE X 2)
CULTURE: NO GROWTH
Culture: NO GROWTH
Special Requests: ADEQUATE

## 2017-07-13 ENCOUNTER — Encounter: Payer: Self-pay | Admitting: *Deleted

## 2017-07-13 NOTE — Progress Notes (Signed)
On 07-13-17 fax medical records to [facility] medical supply it was the last office note from 06-18-17

## 2017-07-17 NOTE — Progress Notes (Signed)
At [phone] Patient's heart rhythm slowed into 50's, patient's RN called family back to bedside, both she and I stayed with patient while awaiting family's arrival. Patients heart monitor showed asystole at 0926, 2 RN's verified no pulse or auscultated beats, this RN and Austin Miles. Family called back to notify.

## 2017-07-17 NOTE — Discharge Summary (Signed)
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 male with medical history significant of HTN, chronic respiratory failure, metastatic SCC of the retromolar trigone s/p surgery, adjunctive radiotherapy, palliative chemotherapy, and s/p Trach/PEG; who presents with complaints of shortness of breath and productive cough progressively worsening over the last 2 weeks.  History is difficult to obtain as patient is nonverbal due to tracheostomy.  Normally patient is oxygen dependent at 3 L cannula oxygen by trach collar at home.  He reports having a productive cough with thick sputum production. Associated symptoms of wheezing, malaise, poor p.o. intake, and continued left-sided neck pain that radiates to the shoulder.  Pain medication helps with symptoms.  He reports still taking in some liquids by mouth, but mostly utilizes his PEG tube.  He admits to coughing when trying to take things by mouth.  Denies any significant fever, chills appears from review of office records with Dr. Lebron Conners and Dr. Alvy Bimler chemotherapy was recently discontinued after repeat CT scan on 5/16 showing residual left neck mass with interval ulceration consistent with progressive disease.  Hospice had been consulted, and the patient reports that they were actually seeing him today prior to coming into the  hospital.  At this time he still would like to remain a full code."  Hospital Course:  Patient presented with pneumonia.  Sputum culture positive for Staphylococcus aureus.  Patient was on appropriate antibiotics.  Pleural effusion progressively worsen with worsening respiratory distress.  CCM was consulted, patient was felt to be poor candidate for any aggressive intervention and recommended comfort measures.  Discussed with the family and family chose comfort measures over aggressive measures. Patient was transition to comfort care and The patient was pronounced deceased at 9:26 AM on 07/28/2015.  1.  Aspiration pneumonia.  Bacterial, Staphylococcus aureus From sputum culture Acute hypoxic respiratory failure. Excessive secretions. Started on vancomycin and cefepime since admission, sputum culture is positive for staph aureus and MRSA PCR is positive, chest PT was provided. N.p.o., nutrition via PEG tube only. Due to excessive secretion start the patient on Robinul  Also given duo nebs and Mucinex.  2.  Hyponatremia.  Hypomagnesemia, hypercalcemia Likely poor p.o. intake. Monitor. Resolved.  3.  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 Malignant pleural effusion. Per oncology patient has a progressive disease S/P surgery, radiation, palliative chemotherapy. Recent CTs continues to show progression. Oncology recommended hospice for the patient as the patient is not a candidate for second line chemotherapy due to his poor performance status as well as poor nutritional status. Palliative care consulted.  4.  Protein calorie malnutrition, severe Underweight. Prealbumin very low. Patient was receiving Prostat twice daily Juven twice daily with Osmolite continuous tube feeding.  5.  Anemia from nutritional deficiency. Most likely due to patient's poor p.o. intake.  Procedures and Results:  none   Consultations:  PCCM  Palliative care    The results of  significant diagnostics from this hospitalization (including imaging, microbiology, ancillary and laboratory) are listed below for reference.    Significant Diagnostic Studies: Dg Chest 2 View  Result Date: 06/19/2017 CLINICAL DATA:  Shortness of breath. EXAM: CHEST - 2 VIEW COMPARISON:  Chest radiograph Jul 01, 2017 and chest CT May 162019 FINDINGS: Port-A-Cath tip is at the superior vena cava. Tracheostomy tube tip is 6.3 cm above the carina. No pneumothorax. There is a persistent right pleural effusion with patchy airspace consolidation in both base regions, more on the left than on the right. Heart size and pulmonary vascular normal. No appreciable adenopathy. There are surgical clips in left axillary region. There are also surgical clips in the lower right neck region. No blastic or lytic bone lesions are identified. There old healed rib fractures on the right. IMPRESSION: Tube and catheter positions as described without pneumothorax. Right pleural effusion with bilateral lower lobe consolidation. Heart size normal. There is increase infiltrate in the left lower lobe compared to recent studies. Pleural effusion on the right essentially stable. Electronically Signed   By: Lowella Grip III M.D.   On: 06/17/2017 18:25   Dg Chest 2 View  Result Date: 07/01/2017 CLINICAL DATA:  Worsening shortness of breath EXAM: CHEST - 2 VIEW COMPARISON:  06/10/2017 FINDINGS: Right Port-A-Cath and tracheostomy remain in place, unchanged. New small to moderate right pleural effusion. Bibasilar opacities likely reflect atelectasis. Heart is normal size. No acute bony abnormality. Multiple old healed right rib fractures. IMPRESSION: New small to moderate right pleural effusion. Bibasilar opacities, likely atelectasis. Electronically Signed   By: Rolm Baptise M.D.   On: 07/01/2017 10:16   Ct Soft Tissue Neck W Contrast  Result Date: 07/01/2017 CLINICAL DATA:  Retromolar trigone cancer with surgery and radiation  therapy. EXAM: CT NECK WITH CONTRAST TECHNIQUE: Multidetector CT imaging of the neck was performed using the standard protocol following the bolus administration of intravenous contrast. CONTRAST:  9mL ISOVUE-300 IOPAMIDOL (ISOVUE-300) INJECTION 61% COMPARISON:  03/25/2017 FINDINGS: Pharynx and larynx: History of retromolar trigone cancer with resection requiring mandibular reconstruction and flap. The necrotic mass seen previously has uncovered and become a deep ulcer. There is residual irregular masslike enhancement at the margins of the mass. A necrotic nodular component is seen on 8:35. There is also clustered ulcerated skin lesions in this region consistent superficial tumor. These are amenable to clinical biopsy if warranted. As before there is asymmetric prominent submucosal low-density and expansion and mucosal hyperenhancement involving the left aspect of the supraglottic larynx, presumably treatment effects given no definite communication between the ulcer and this space. A tracheostomy tube remains well positioned. Salivary glands: Stable post treatment changes Thyroid: The left gland has become more atrophic since prior Lymph nodes: No definite nodal enlargement. Vascular: Narrowing of the left ICA at the level of the styloid process, likely progressed. No pseudoaneurysm or occlusion seen. Limited intracranial: Negative Visualized orbits: Negative Mastoids and visualized paranasal sinuses: Clear Skeleton: Left mandibular reconstruction.  No destructive changes. Upper chest: Reported separately Other: There is interval hazy appearance of subcutaneous fat with poor muscular definition, the appearance of cachexia. Submandibular nodule over the right mandible measuring 12 mm, presumably a dermal inclusion cyst given distance from the primary disease. IMPRESSION: 1. Residual left neck mass that is difficult to compare to prior given interval ulceration. The crater is irregularly enhancing and likely lined by  tumor. Progressive dermal based lesions in the lateral left neck  skin with ulceration, consistent with progressive disease. 2. Continued prominent submucosal edema and mucosal enhancement in the left supraglottic larynx. 3. Interval cachectic appearance Electronically Signed   By: Monte Fantasia M.D.   On: 07/01/2017 16:51   Ct Chest W Contrast  Result Date: 07/02/2017 CLINICAL DATA:  Squamous cell head/neck cancer EXAM: CT CHEST WITH CONTRAST TECHNIQUE: Multidetector CT imaging of the chest was performed during intravenous contrast administration. CONTRAST:  48mL ISOVUE-300 IOPAMIDOL (ISOVUE-300) INJECTION 61% COMPARISON:  CT chest dated 03/25/2017 FINDINGS: Cardiovascular: Heart is normal in size.  No pericardial effusion. No evidence of thoracic aortic aneurysm. Mild atherosclerotic calcifications of the aortic arch. Mild coronary atherosclerosis of the LAD. Right chest port terminates at the cavoatrial junction Mediastinum/Nodes: No suspicious mediastinal lymphadenopathy. Visualized thyroid is grossly unremarkable. Lungs/Pleura: Tracheostomy in satisfactory position. Moderate loculated right pleural effusion, new. Small loculated left pleural effusion, new. Bronchiectasis with bronchial wall thickening in the bilateral lower lobes, new. Associated mild patchy opacities with interlobular septal thickening in the right middle lobe and bilateral lower lobes. Overall appearance favors sequela of aspiration. 10 mm patchy/nodular opacity in the posterior right lower lobe (series 5/image 87), likely infectious/inflammatory and related to aspiration, attention on follow-up suggested. Mild centrilobular and paraseptal emphysematous changes, upper lobe predominant. No pneumothorax. Upper Abdomen: Visualized upper abdomen is incompletely visualized but grossly unremarkable. Musculoskeletal: Visualized osseous structures are within normal limits, noting old right rib deformities. IMPRESSION: Moderate right and small  left pleural effusions, loculated, new from prior CT chest. Given complexity, malignant pleural effusions are not excluded. Suspected sequela of aspiration in the right middle lobe and bilateral lower lobes, new. Associated 10 mm patchy/nodular opacity in the right lower lobe, favoring sequela of aspiration, although attention on follow-up suggested. Aortic Atherosclerosis (ICD10-I70.0) and Emphysema (ICD10-J43.9). Electronically Signed   By: Julian Hy M.D.   On: 07/02/2017 07:32   Ir Replc Gastro/colonic Tube Percut W/fluoro  Result Date: 06/18/2017 INDICATION: History of head neck cancer with gastrostomy tube placed at outside institution. Patient states gastrostomy tube was removed approximately 3 weeks ago secondary to excessive leaking however the patient's gastrostomy track has leaked ever since. Patient presents today now to the interventional radiology clinic with leaking from gastrostomy tract. Patient states he has tolerated p.o. intake poorly and if gastrostomy tube could be replaced he would utilize it for enteric nutrition supplementation purposes. EXAM: FLUOROSCOPIC GUIDED REPLACEMENT OF GASTROSTOMY TUBE COMPARISON:  CT abdomen pelvis - 06/11/2017 MEDICATIONS: None. CONTRAST:  29mL ISOVUE-300 IOPAMIDOL (ISOVUE-300) INJECTION 61% administered into the gastric lumen FLUOROSCOPY TIME:  30 seconds (2.8 mGy) COMPLICATIONS: None immediate. PROCEDURE: Informed written consent was obtained from the patient after a discussion of the risks, benefits and alternatives to treatment. Questions regarding the procedure were encouraged and answered. A timeout was performed prior to the initiation of the procedure. The upper abdomen and site of the previous gastrostomy tube was prepped and draped in usual sterile fashion. Maximum barrier sterile technique with sterile gowns and gloves were used for the procedure. A timeout was performed prior to the initiation of the procedure. The track of the gastrostomy  tube was injected with a small amount of contrast via the Christmas tree adapter. Next, with the use of a an Amplatz wire, a Kumpe catheter was advanced through the gastrostomy track to the level of the stomach. Contrast injection confirmed appropriate positioning. Next, over an Amplatz wire, a new 36 French balloon inflatable gastrostomy tube was advanced through the tract into the gastric lumen. The balloon  was inflated and disc was cinched. Contrast was injected and several spot fluoroscopic images were obtained in various obliquities to confirm appropriate intraluminal positioning. A dressing was placed. The patient tolerated the procedure well without immediate postprocedural complication. IMPRESSION: Successful fluoroscopic guided replacement of a new 16-French gastrostomy tube. The new gastrostomy tube is ready for immediate use. Electronically Signed   By: Sandi Mariscal M.D.   On: 06/18/2017 17:37   Dg Chest Port 1 View  Result Date: 07/09/2017 CLINICAL DATA:  Shortness of breath EXAM: PORTABLE CHEST 1 VIEW COMPARISON:  07/04/2017 FINDINGS: Cardiac shadow is obscured by large right-sided pleural effusion significantly increased from the prior exam. Patchy infiltrate is noted in the residual right lung as well as diffusely throughout the left lung consistent with multifocal infiltrates. Tracheostomy tube and right chest wall port are noted in satisfactory position. No pneumothorax is seen. No acute bony abnormality is noted. Multiple old rib fractures are again seen on the right. IMPRESSION: Enlarging right-sided pleural effusion with multifocal bilateral infiltrates. Electronically Signed   By: Inez Catalina M.D.   On: 07/09/2017 08:50    Microbiology: Recent Results (from the past 240 hour(s))  Culture, blood (routine x 2) Call MD if unable to obtain prior to antibiotics being given     Status: None   Collection Time: 06/18/2017  8:45 PM  Result Value Ref Range Status   Specimen Description   Final      BLOOD LEFT FOREARM Performed at [facility], [place] [address] [place], [place] [address]    Special Requests   Final    BOTTLES DRAWN AEROBIC AND ANAEROBIC Blood Culture results may not be optimal due to an inadequate volume of blood received in culture bottles Performed at [facility] [address] [place], [place] [address]    Culture   Final    NO GROWTH 5 DAYS Performed at [facility], [place] [address] [place], [place] [address]    Report Status 07/11/2017 FINAL  Final  Culture, blood (routine x 2) Call MD if unable to obtain prior to antibiotics being given     Status: None   Collection Time: 07/01/2017  8:45 PM  Result Value Ref Range Status   Specimen Description   Final    BLOOD RIGHT FOREARM Performed at [facility] [address], [place], [place] [address]    Special Requests   Final    BOTTLES DRAWN AEROBIC ONLY Blood Culture adequate volume Performed at [facility] [address], [place], [place] [address]    Culture   Final    NO GROWTH 5 DAYS Performed at [facility], [place] [address], [place], [place] [address]    Report Status 07/11/2017 FINAL  Final  MRSA PCR Screening     Status: Abnormal   Collection Time: 06/29/2017 10:00 PM  Result Value Ref Range Status   MRSA by PCR POSITIVE (A) NEGATIVE Final    Comment:        The GeneXpert MRSA Assay (FDA approved for NASAL specimens only), is one component of a comprehensive MRSA colonization surveillance program. It is not intended to diagnose MRSA infection nor to guide or monitor treatment for MRSA infections. RESULT CALLED TO, READ BACK BY AND VERIFIED WITH: ISABELLA,RN @0316  07/07/17 MKELLY Performed at [facility], [place] [address] [place], [place] [address]   Culture, respiratory (NON-Expectorated)     Status: None   Collection Time:  07/07/17 12:11 AM  Result Value Ref Range Status    Specimen Description   Final    TRACHEAL ASPIRATE Performed at [facility] [address] [place], [place] [address]    Special Requests   Final    NONE Performed at [facility], [place] [address] [place], [place] [address]    Gram Stain   Final    ABUNDANT WBC PRESENT, PREDOMINANTLY PMN MODERATE GRAM POSITIVE COCCI FEW GRAM POSITIVE RODS RARE YEAST Performed at [facility], [place] [address], [place], [place] [address]    Culture   Final    MODERATE METHICILLIN RESISTANT STAPHYLOCOCCUS AUREUS   Report Status 07/09/2017 FINAL  Final   Organism ID, Bacteria METHICILLIN RESISTANT STAPHYLOCOCCUS AUREUS  Final      Susceptibility   Methicillin resistant staphylococcus aureus - MIC*    CIPROFLOXACIN <=0.5 SENSITIVE Sensitive     ERYTHROMYCIN >=8 RESISTANT Resistant     GENTAMICIN <=0.5 SENSITIVE Sensitive     OXACILLIN >=4 RESISTANT Resistant     TETRACYCLINE <=1 SENSITIVE Sensitive     VANCOMYCIN 1 SENSITIVE Sensitive     TRIMETH/SULFA <=10 SENSITIVE Sensitive     CLINDAMYCIN <=0.25 SENSITIVE Sensitive     RIFAMPIN <=0.5 SENSITIVE Sensitive     Inducible Clindamycin NEGATIVE Sensitive     * MODERATE METHICILLIN RESISTANT STAPHYLOCOCCUS AUREUS     Labs: CBC: Recent Labs  Lab 07/07/17 0343 07/08/17 0812 07/09/17 1001  WBC 4.9 5.7 6.1  NEUTROABS  --  4.3 4.7  HGB 7.4* 9.2* 8.8*  HCT 23.1* 27.8* 28.3*  MCV 97.5 100.7* 104.4*  PLT 507* 381 409   Basic Metabolic Panel: Recent Labs  Lab 07/07/17 0343 07/07/17 1215 07/07/17 1723 07/08/17 0451 07/08/17 0812 07/08/17 1845 07/09/17 1001  NA 131*  --   --   --  132*  --  138  K 4.1  --   --   --  3.6  --  4.0  CL 94*  --   --   --  98*  --  97*  CO2 23  --   --   --  26  --  32  GLUCOSE 86  --   --   --  114*  --  129*  BUN 16  --   --   --  19  --  20  CREATININE 0.65  --   --   --  0.60*  --  0.54*  CALCIUM 10.9*  --   --   --  10.2  --  10.3  MG 1.2*  --   --   --  1.4*   --  1.5*  PHOS  --  3.7 3.1 2.8  --  2.4*  --    Liver Function Tests: Recent Labs  Lab 07/07/17 0343 07/08/17 0812 07/09/17 1001  AST 26 18 13*  ALT 18 16* 13*  ALKPHOS 58 51 55  BILITOT 0.6 0.3 0.2*  PROT 6.5 6.5 6.4*  ALBUMIN 2.4* 2.4* 2.2*   No results for input(s): LIPASE, AMYLASE in the last 168 hours. No results for input(s): AMMONIA in the last 168 hours. Cardiac Enzymes: No results for input(s): CKTOTAL, CKMB, CKMBINDEX, TROPONINI in the last 168 hours. BNP (last 3 results) No results for input(s): BNP in the last 8760 hours. CBG: Recent Labs  Lab 07/08/17 1914 07/08/17 2304 07/09/17 0322 07/09/17 0735 07/09/17 1116  GLUCAP 118* 124* 122* 127* 119*   Time spent: 35  minutes  Signed:  Berle Mull  [facility] 07/13/2017, 6:29 PM

## 2017-07-17 NOTE — Progress Notes (Signed)
Pt noted with increased work of breathing and respiratory rate of 36.  Pt nodded head when asked if he felt short of breath and had increased work of breathing. Pt medicated with 2 mg Morphine IV per order.  See MAR for details.  Assisted pt with repositioning after administration. Visitor present at bedside.

## 2017-07-17 NOTE — Progress Notes (Signed)
Patient expired at [phone] confirmed with Yehuda Budd RN.  Patients significant other and daughter notified of patients passing.  Dr. Posey Pronto notified.

## 2017-07-17 DEATH — deceased

## 2018-05-13 NOTE — Therapy (Signed)
[facility] [address] [place], [place], [address] Phone: [phone]   Fax:  [phone]  Patient Details  Name: Lawrence Arias MRN: [id number] Date of Birth: March 14, 1960 Referring Provider:  Eppie Gibson, MD   Encounter Date: 05/13/2018  SPEECH THERAPY DISCHARGE SUMMARY  Visits from Start of Care: 2 (pt arrived-cancel for two appointments)  Current functional level related to goals / functional outcomes: Pt was seen for 2 of 4 scheduled therapy sessions, on arrival for his third session 07-05-17, pt politely declined due to extreme fatigue. Ultimately, pt was admitted soon after this and unfortunately, expired approx 10 days later.  Goals/progress at his last seen visit follow:  SLP Long Term Goals - 05/11/17 1149              SLP LONG TERM GOAL #1    Title  pt will complete HEP with rare min A over two sessions     Baseline  total A; 05-11-17     Time  2     Period  -- sessions     Status  Partially Met          SLP LONG TERM GOAL #2    Title  pt will tell SLP why he is completing HEP with modified independence     Baseline  total A     Status  Achieved          SLP LONG TERM GOAL #3    Title  pt will tell SLP when HEP frequency can be reduced to x2-3/week     Baseline  total A     Status  Achieved              Plan - 05/11/17 1147     Clinical Impression Statement  Pt with oropharyngeal swallowing functional for dys I and thin (wet voice delayed approx 30 seconds after liquids), however pt without overt s/s aspiration PNA. The probability of swallowing difficulty increases with pt having undergone chemo and radiation therapy. Pt met 2 of 3 short term goals today, and partially met the third. Pt will need to cont to be followed by SLP for regular assessment of accurate HEP completion as well as for safety with POs both during and following treatment/s.      Remaining deficits: None   Education /  Equipment: Swallow precautions, compensations, late effects head/neck rad on swallowing.   Plan:                                                    Patient goals were not met. Patient is being discharged due to a change in medical status.  ?????        [place] ,MS, CCC-SLP  05/13/2018, 12:47 PM  [facility] [address] [place], [place], [address] Phone: [phone]   Fax:  [phone]

## 2018-07-23 IMAGING — CT CT ABDOMEN W/ CM
3 of 5 series · 16 of 46 positions shown, 18 images · IV contrast (omnipaque)
Comparison: None.

CLINICAL DATA: Abdominal pain and chronic anemia. Remote history of
throat cancer. Some pain and swelling around the G-tube site.

EXAM:
CT ABDOMEN WITH CONTRAST
TECHNIQUE: Multidetector CT imaging of the abdomen was performed using the
standard protocol following bolus administration of intravenous
contrast.
CONTRAST:  75mL OMNIPAQUE IOHEXOL 300 MG/ML SOLN, 30mL [id number]
IOPAMIDOL ([id number]) INJECTION 61%

[Series 2: axial st · axial · 0.61mm/px · z∈[-243,-58]mm · 11 of 45 slices shown, 13 images]
[im 4/45  soft-tissue]
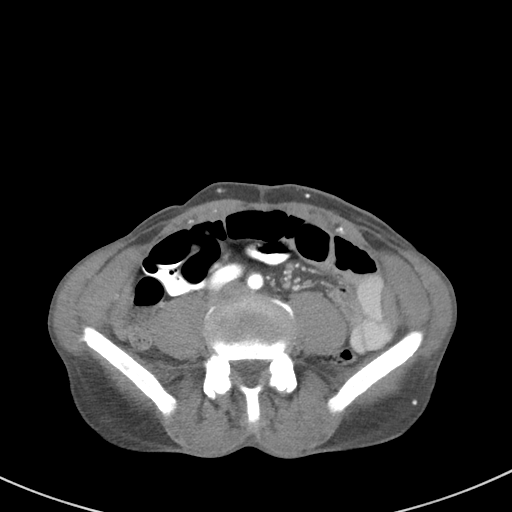
[im 4/45  bone]
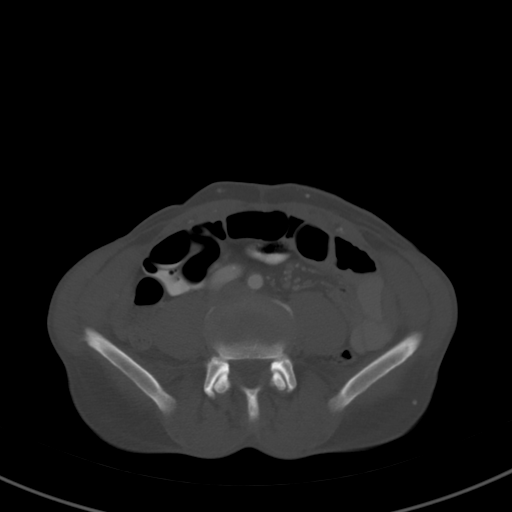
[im 8/45  soft-tissue]
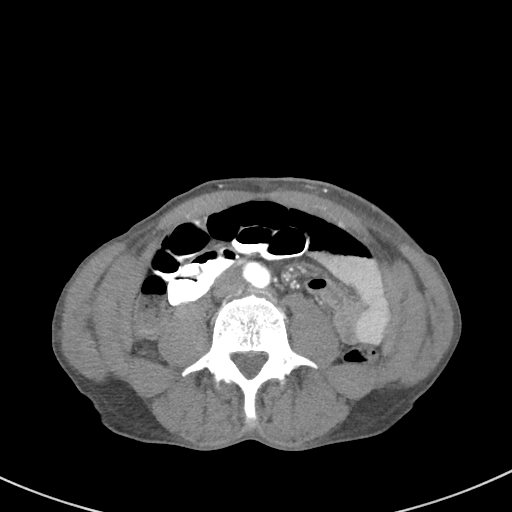
[im 12/45  soft-tissue]
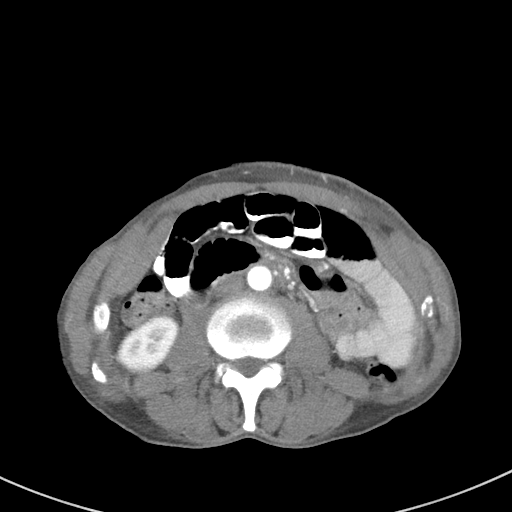
[im 15/45  soft-tissue]
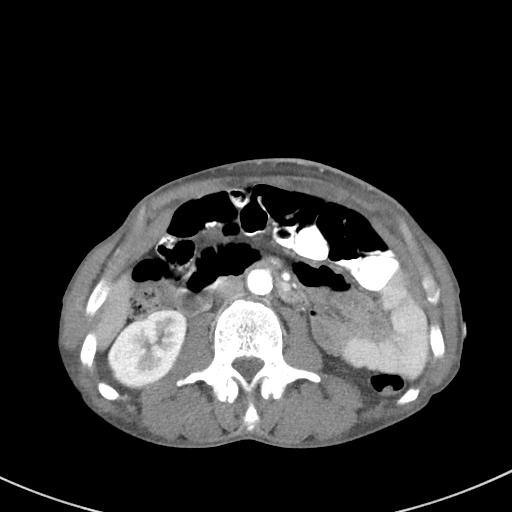
[im 19/45  soft-tissue]
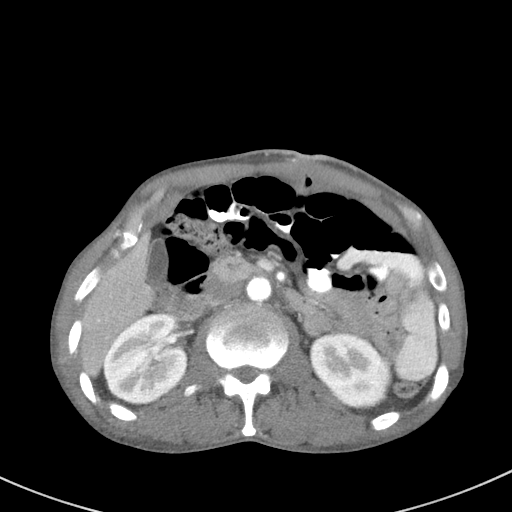
[im 23/45  soft-tissue]
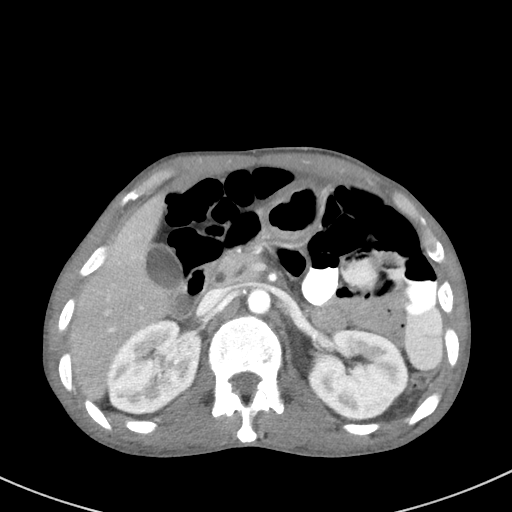
[im 26/45  soft-tissue]
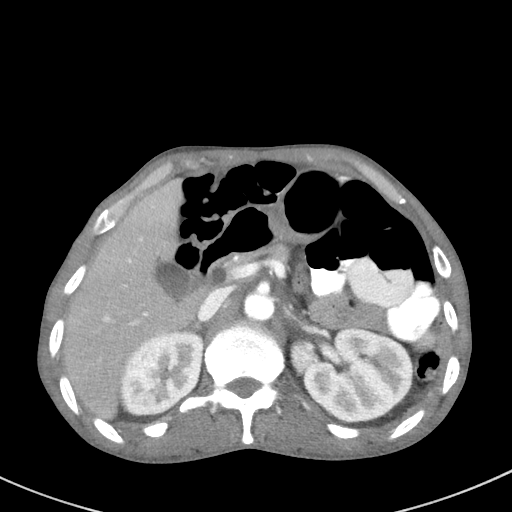
[im 30/45  soft-tissue]
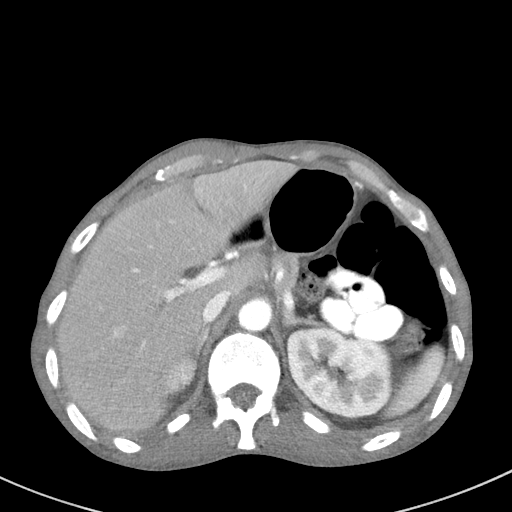
[im 34/45  soft-tissue]
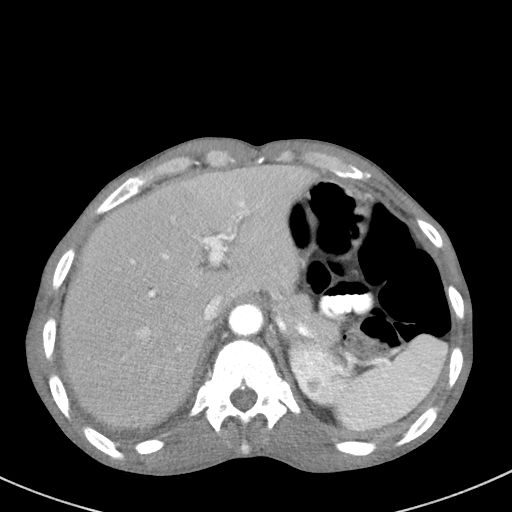
[im 34/45  bone]
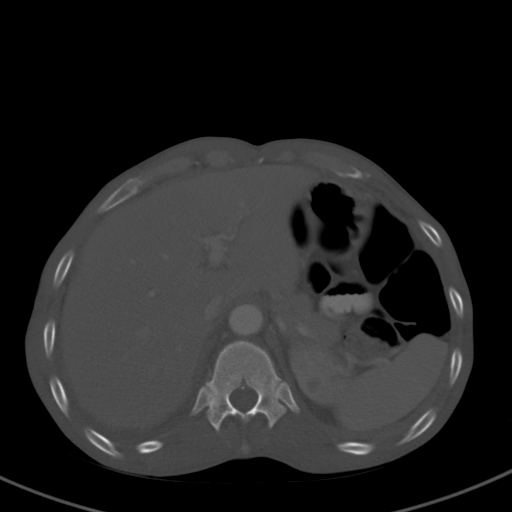
[im 37/45  soft-tissue]
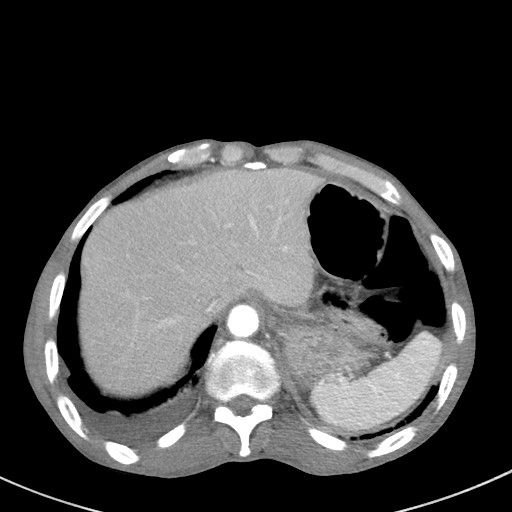
[im 41/45  soft-tissue]
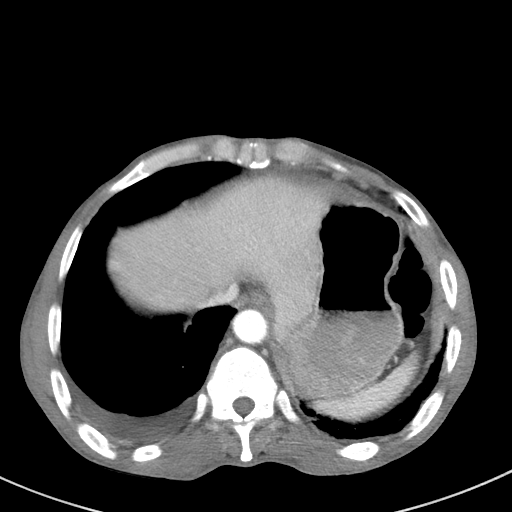

[Series 4: lung bases · axial · 0.61mm/px · z∈[-100,-92]mm · 2 of 35 slices shown]
[im 4/35  bone]
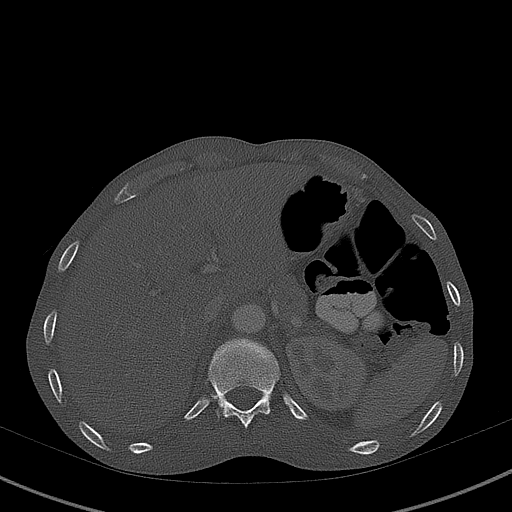
[im 8/35  bone]
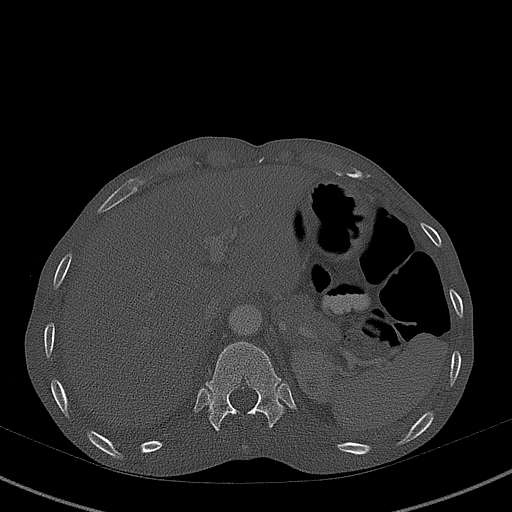

[Series 5: coronal st · coronal · 0.46mm/px · 3 of 69 slices shown]
[im 23/69  soft-tissue]
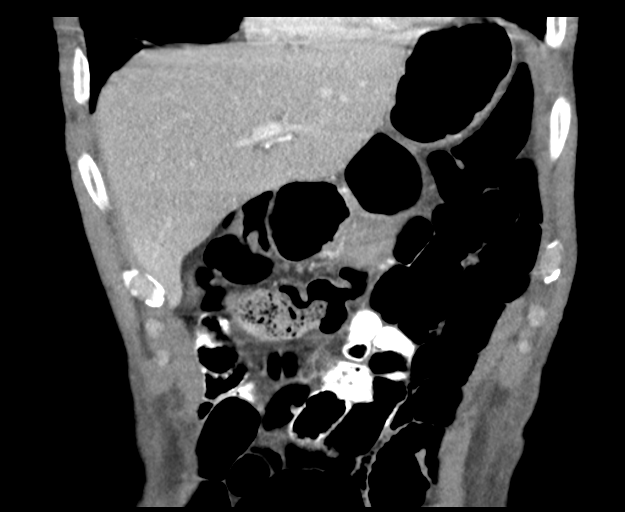
[im 31/69  soft-tissue]
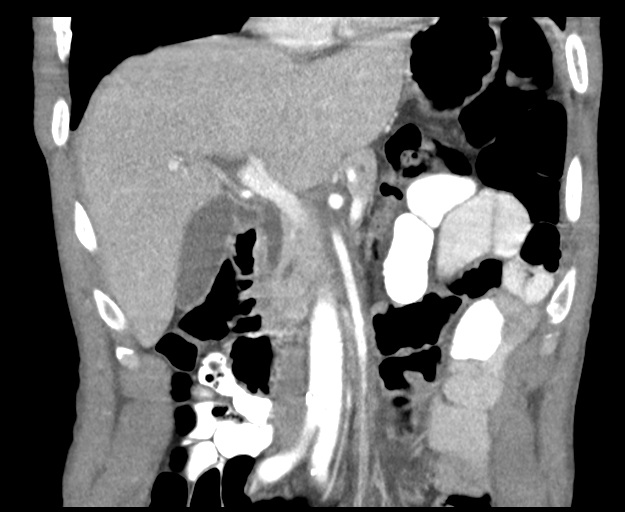
[im 38/69  soft-tissue]
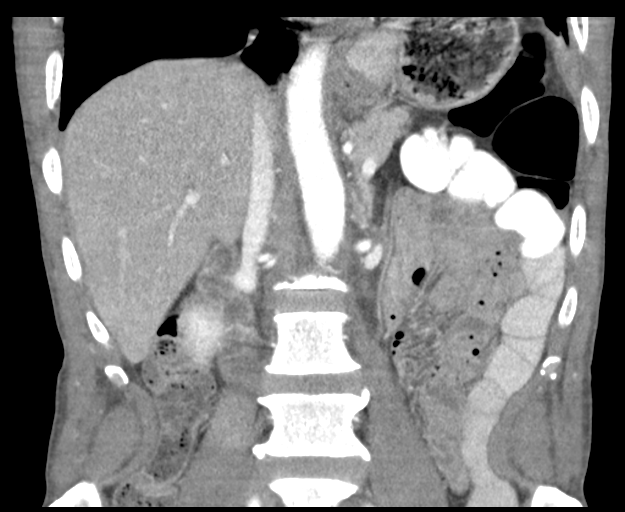

[16 of 46 positions shown; findings below may reference images not displayed]

FINDINGS: Lower chest: Small bilateral pleural effusions with minimal
overlying atelectasis. There is fairly marked elevation of the left
hemidiaphragm. No pericardial effusion.

Hepatobiliary: No focal hepatic lesions or intrahepatic biliary
dilatation. The gallbladder appears normal. Mild common bile duct
dilatation. The common bile duct and head of the pancreas measures
7.3 mm. It tapers normally to the ampulla.

Pancreas: No mass, inflammation or ductal dilatation.

Spleen: Normal size.  No focal lesions.

Adrenals/Urinary Tract: The adrenal glands and kidneys are
unremarkable.

Stomach/Bowel: The stomach, duodenum, visualized small bowel loops
and visualized colon are unremarkable. The gastrostomy tube has been
removed. Slight thickening of the left rectus muscle and a small
amount of air in the G-tube tract. No discrete abscess is
identified. Mild gastric wall thickening in this area also. No
obstruction or leaking oral contrast.

Vascular/Lymphatic: Scattered atherosclerotic changes involving the
aorta. The branch vessels are patent. The major venous structures
are patent. No mesenteric or retroperitoneal mass or adenopathy.

Other: No ascites or abdominal wall hernia.

Musculoskeletal: No significant bony findings.
IMPRESSION: 1. The feeding gastrostomy tube has been removed. There is a small
amount of air in the tract but no discrete abscess. Mild thickening
of the left rectus muscle and also the stomach wall. No leaking oral
contrast is identified.
2. No acute abdominal findings, mass lesions or adenopathy.
3. Small bilateral pleural effusions.

## 2018-08-20 IMAGING — DX DG CHEST 1V PORT
1 series · 1 of 1 positions shown · non-contrast
Comparison: 07/06/2017

CLINICAL DATA: Shortness of breath

EXAM:
PORTABLE CHEST 1 VIEW

[chest ap]
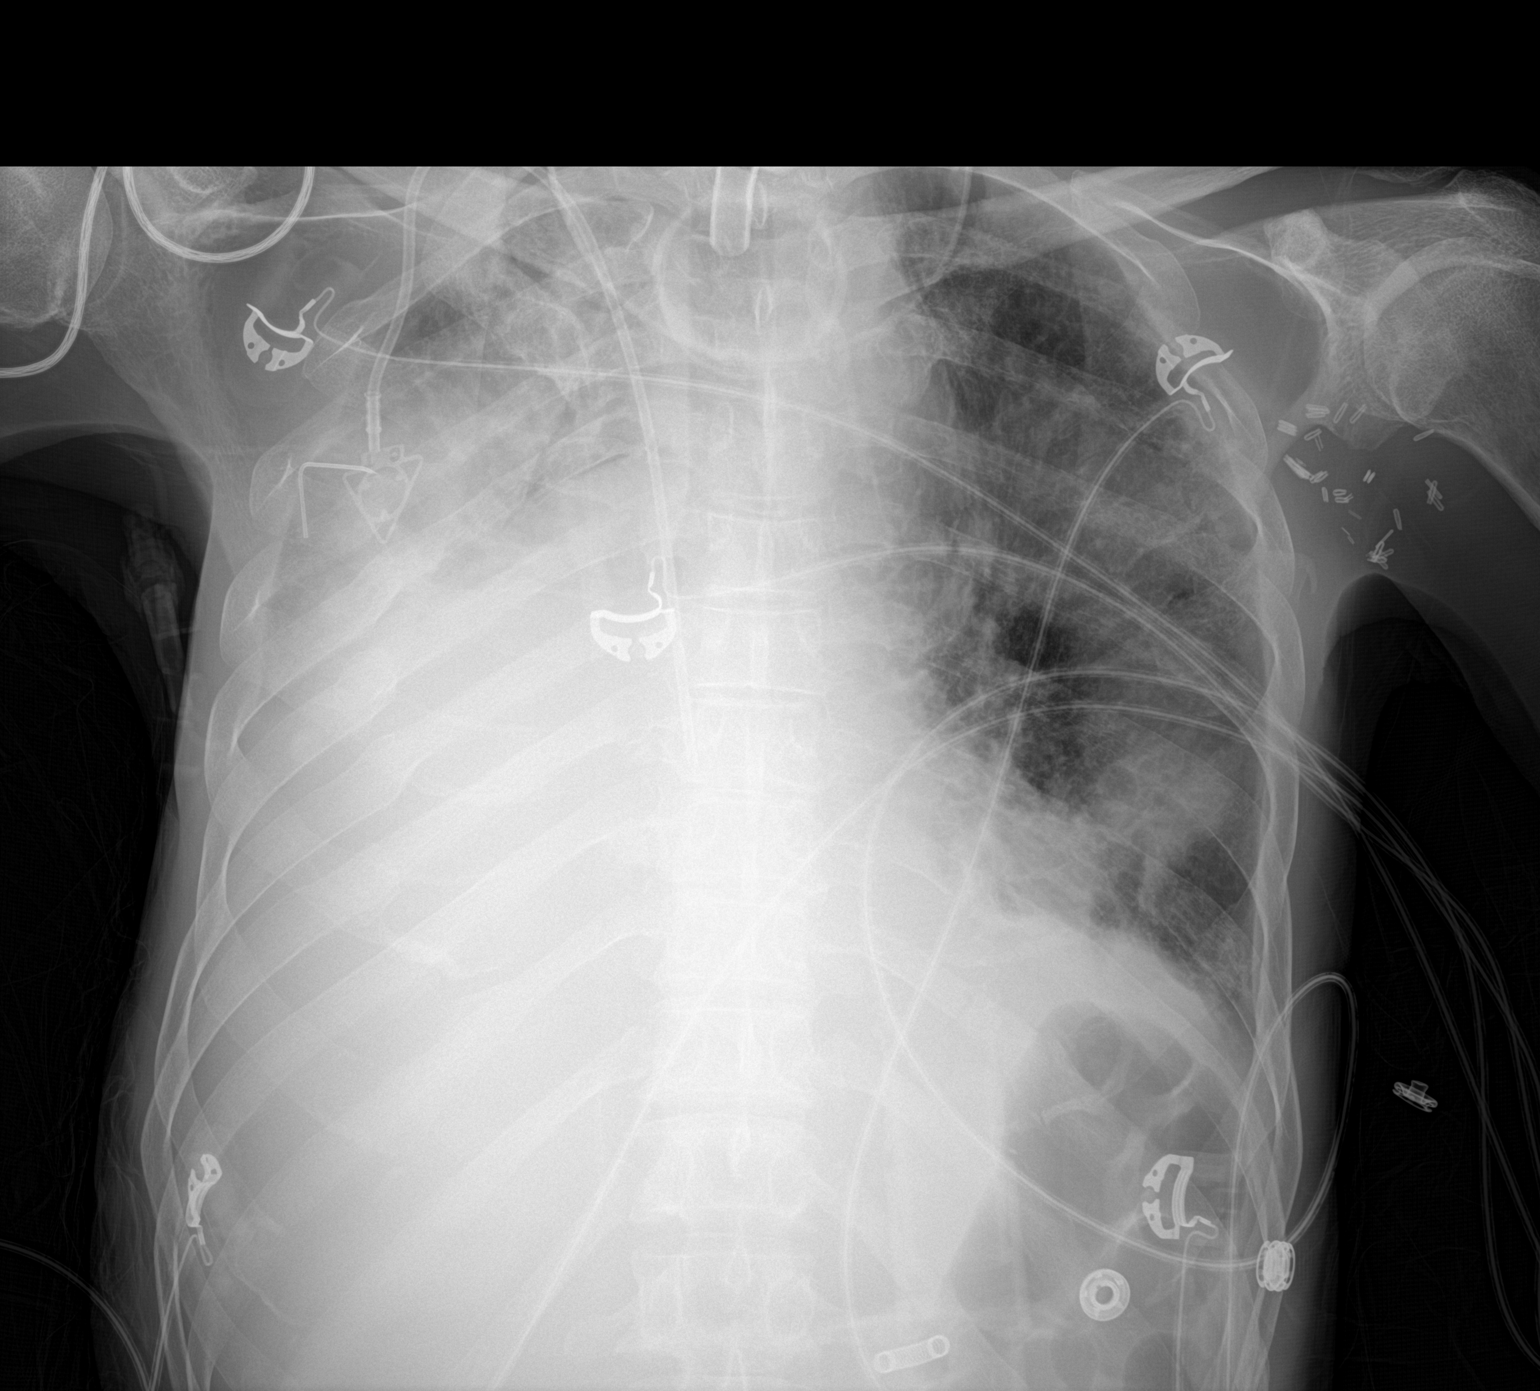

[1 of 1 positions shown; findings below may reference images not displayed]

FINDINGS: Cardiac shadow is obscured by large right-sided pleural effusion
significantly increased from the prior exam. Patchy infiltrate is
noted in the residual right lung as well as diffusely throughout the
left lung consistent with multifocal infiltrates. Tracheostomy tube
and right chest wall port are noted in satisfactory position. No
pneumothorax is seen. No acute bony abnormality is noted. Multiple
old rib fractures are again seen on the right.
IMPRESSION: Enlarging right-sided pleural effusion with multifocal bilateral
infiltrates.
